# Patient Record
Sex: Female | Born: 1955
Health system: Southern US, Community
[De-identification: ages and names within clinical notes are randomized; demographics above are authoritative.]

## PROBLEM LIST (undated history)

## (undated) DIAGNOSIS — IMO0001 Reserved for inherently not codable concepts without codable children: Secondary | ICD-10-CM

## (undated) DIAGNOSIS — Z853 Personal history of malignant neoplasm of breast: Secondary | ICD-10-CM

## (undated) DIAGNOSIS — D0512 Intraductal carcinoma in situ of left breast: Secondary | ICD-10-CM

## (undated) DIAGNOSIS — M810 Age-related osteoporosis without current pathological fracture: Secondary | ICD-10-CM

## (undated) DIAGNOSIS — N950 Postmenopausal bleeding: Secondary | ICD-10-CM

## (undated) DIAGNOSIS — Z87442 Personal history of urinary calculi: Secondary | ICD-10-CM

## (undated) DIAGNOSIS — Z8709 Personal history of other diseases of the respiratory system: Secondary | ICD-10-CM

## (undated) DIAGNOSIS — M1990 Unspecified osteoarthritis, unspecified site: Secondary | ICD-10-CM

## (undated) DIAGNOSIS — I10 Essential (primary) hypertension: Secondary | ICD-10-CM

## (undated) DIAGNOSIS — Z803 Family history of malignant neoplasm of breast: Secondary | ICD-10-CM

## (undated) DIAGNOSIS — IMO0002 Reserved for concepts with insufficient information to code with codable children: Secondary | ICD-10-CM

## (undated) DIAGNOSIS — C50919 Malignant neoplasm of unspecified site of unspecified female breast: Secondary | ICD-10-CM

## (undated) HISTORY — DX: Family history of malignant neoplasm of breast: Z80.3

## (undated) HISTORY — PX: TUBAL LIGATION: SHX77

## (undated) HISTORY — PX: TONSILLECTOMY: SUR1361

## (undated) HISTORY — DX: Reserved for inherently not codable concepts without codable children: IMO0001

## (undated) HISTORY — DX: Reserved for concepts with insufficient information to code with codable children: IMO0002

## (undated) HISTORY — DX: Malignant neoplasm of unspecified site of unspecified female breast: C50.919

## (undated) HISTORY — DX: Personal history of malignant neoplasm of breast: Z85.3

## (undated) HISTORY — DX: Postmenopausal bleeding: N95.0

## (undated) HISTORY — PX: LASIK: SHX215

---

## 1993-07-15 HISTORY — PX: FOOT SURGERY: SHX648

## 2003-12-09 ENCOUNTER — Ambulatory Visit (HOSPITAL_COMMUNITY): Admission: RE | Admit: 2003-12-09 | Discharge: 2003-12-09 | Payer: Self-pay | Admitting: Family Medicine

## 2005-12-13 ENCOUNTER — Ambulatory Visit (HOSPITAL_COMMUNITY): Admission: RE | Admit: 2005-12-13 | Discharge: 2005-12-13 | Payer: Self-pay | Admitting: Family Medicine

## 2007-04-13 ENCOUNTER — Ambulatory Visit (HOSPITAL_COMMUNITY): Admission: RE | Admit: 2007-04-13 | Discharge: 2007-04-13 | Payer: Self-pay | Admitting: Family Medicine

## 2007-11-26 ENCOUNTER — Encounter: Payer: Self-pay | Admitting: Orthopedic Surgery

## 2008-04-27 ENCOUNTER — Ambulatory Visit (HOSPITAL_COMMUNITY): Admission: RE | Admit: 2008-04-27 | Discharge: 2008-04-27 | Payer: Self-pay | Admitting: Family Medicine

## 2008-04-27 ENCOUNTER — Encounter: Payer: Self-pay | Admitting: Orthopedic Surgery

## 2008-04-27 ENCOUNTER — Encounter (HOSPITAL_COMMUNITY): Admission: RE | Admit: 2008-04-27 | Discharge: 2008-05-27 | Payer: Self-pay | Admitting: Podiatry

## 2008-07-20 ENCOUNTER — Ambulatory Visit: Payer: Self-pay | Admitting: Orthopedic Surgery

## 2008-07-20 DIAGNOSIS — M722 Plantar fascial fibromatosis: Secondary | ICD-10-CM | POA: Insufficient documentation

## 2008-08-31 ENCOUNTER — Ambulatory Visit: Payer: Self-pay | Admitting: Orthopedic Surgery

## 2008-08-31 DIAGNOSIS — M7750 Other enthesopathy of unspecified foot: Secondary | ICD-10-CM | POA: Insufficient documentation

## 2008-09-01 ENCOUNTER — Telehealth: Payer: Self-pay | Admitting: Orthopedic Surgery

## 2008-09-05 ENCOUNTER — Ambulatory Visit (HOSPITAL_COMMUNITY): Admission: RE | Admit: 2008-09-05 | Discharge: 2008-09-05 | Payer: Self-pay | Admitting: Orthopedic Surgery

## 2008-09-06 ENCOUNTER — Encounter: Payer: Self-pay | Admitting: Orthopedic Surgery

## 2009-05-01 ENCOUNTER — Ambulatory Visit (HOSPITAL_COMMUNITY): Admission: RE | Admit: 2009-05-01 | Discharge: 2009-05-01 | Payer: Self-pay | Admitting: Family Medicine

## 2010-05-14 ENCOUNTER — Ambulatory Visit (HOSPITAL_COMMUNITY): Admission: RE | Admit: 2010-05-14 | Discharge: 2010-05-14 | Payer: Self-pay | Admitting: Family Medicine

## 2010-06-06 ENCOUNTER — Ambulatory Visit (HOSPITAL_COMMUNITY): Admission: RE | Admit: 2010-06-06 | Discharge: 2010-06-06 | Payer: Self-pay | Admitting: Family Medicine

## 2010-08-05 ENCOUNTER — Encounter: Payer: Self-pay | Admitting: Family Medicine

## 2011-04-17 ENCOUNTER — Other Ambulatory Visit: Payer: Self-pay | Admitting: Family Medicine

## 2011-04-17 DIAGNOSIS — Z139 Encounter for screening, unspecified: Secondary | ICD-10-CM

## 2011-05-17 ENCOUNTER — Ambulatory Visit (HOSPITAL_COMMUNITY)
Admission: RE | Admit: 2011-05-17 | Discharge: 2011-05-17 | Disposition: A | Discharge status: Home / Self Care | Payer: 59 | Source: Ambulatory Visit | Attending: Family Medicine | Admitting: Family Medicine

## 2011-05-17 DIAGNOSIS — Z1231 Encounter for screening mammogram for malignant neoplasm of breast: Secondary | ICD-10-CM | POA: Insufficient documentation

## 2011-05-17 DIAGNOSIS — Z139 Encounter for screening, unspecified: Secondary | ICD-10-CM

## 2011-05-29 ENCOUNTER — Emergency Department (HOSPITAL_COMMUNITY)
Admission: EM | Admit: 2011-05-29 | Discharge: 2011-05-29 | Disposition: A | Discharge status: Home / Self Care | Payer: 59 | Attending: Emergency Medicine | Admitting: Emergency Medicine

## 2011-05-29 ENCOUNTER — Encounter: Payer: Self-pay | Admitting: Emergency Medicine

## 2011-05-29 ENCOUNTER — Emergency Department (HOSPITAL_COMMUNITY): Payer: 59

## 2011-05-29 DIAGNOSIS — R209 Unspecified disturbances of skin sensation: Secondary | ICD-10-CM | POA: Insufficient documentation

## 2011-05-29 DIAGNOSIS — M542 Cervicalgia: Secondary | ICD-10-CM | POA: Insufficient documentation

## 2011-05-29 MED ORDER — DIAZEPAM 5 MG PO TABS
5.0000 mg | ORAL_TABLET | Freq: Once | ORAL | Status: AC
  Administered 2011-05-29: 5 mg via ORAL
  Filled 2011-05-29: qty 1

## 2011-05-29 MED ORDER — CYCLOBENZAPRINE HCL 10 MG PO TABS: 10.0000 mg | ORAL_TABLET | Freq: Two times a day (BID) | ORAL | Status: DC | PRN

## 2011-05-29 MED ORDER — HYDROMORPHONE HCL PF 1 MG/ML IJ SOLN
1.0000 mg | Freq: Once | INTRAMUSCULAR | Status: AC
  Administered 2011-05-29: 1 mg via INTRAMUSCULAR

## 2011-05-29 MED ORDER — HYDROCODONE-ACETAMINOPHEN 5-500 MG PO TABS
ORAL_TABLET | ORAL | Status: DC
Start: 2011-05-29 — End: 2011-05-30

## 2011-05-29 MED ORDER — HYDROMORPHONE HCL PF 1 MG/ML IJ SOLN
1.0000 mg | Freq: Once | INTRAMUSCULAR | Status: AC
  Administered 2011-05-29: 1 mg via INTRAMUSCULAR
  Filled 2011-05-29: qty 1

## 2011-05-29 MED ORDER — HYDROMORPHONE HCL PF 1 MG/ML IJ SOLN
INTRAMUSCULAR | Status: AC
  Filled 2011-05-29: qty 1

## 2011-05-29 MED ORDER — IBUPROFEN 800 MG PO TABS
800.0000 mg | ORAL_TABLET | Freq: Once | ORAL | Status: AC
  Administered 2011-05-29: 800 mg via ORAL
  Filled 2011-05-29: qty 1

## 2011-05-29 NOTE — ED Notes (Signed)
Patient c/o neck pain since Saturday.  Saw Dr. Gerda Diss at 5pm on 05/28/2011 and was given Lodine, states took one pill tonight and continues to c/o pain.

## 2011-05-29 NOTE — ED Provider Notes (Signed)
History     CSN: [id number] Arrival date & time: 05/29/2011  2:10 AM   First MD Initiated Contact with Patient 05/29/11 0217      Chief Complaint  Patient presents with  . Neck Pain    (Consider location/radiation/quality/duration/timing/severity/associated sxs/prior treatment) Patient is a 55 y.o. female presenting with neck injury.  Neck Injury This is a new problem. The current episode started yesterday. The problem occurs constantly. The problem has been gradually worsening. Pertinent negatives include no chest pain, no abdominal pain, no headaches and no shortness of breath. Exacerbated by: Movement and seems to be getting worse at nighttime. The symptoms are relieved by nothing. Treatments tried: Etodolac prescribed by her physician today and she took one pill with no relief.   Severe pain. Location is left side of posterior neck. Started a few weeks ago as mild pain left neck that attributed to a crick in her neck possibly from sleeping on it wrong.  She continued to work, where she does do a lot of lifting.  Last 2 days, the pain has been really bad at night. She saw her PCP and was prescribed lodine.  She took it with no relief.  Unable to sleep tonight due to severe pain.  No other trauma or injury. No h/o neck problems previously. She has not had any imaging of her C spine. No alleviating factiors.  Quality is Sharp in nature.   History reviewed. No pertinent past medical history.  History reviewed. No pertinent past surgical history.  No family history on file.  History  Substance Use Topics  . Smoking status: Never Smoker   . Smokeless tobacco: Not on file  . Alcohol Use: No    OB History    Grav Para Term Preterm Abortions TAB SAB Ect Mult Living                  Review of Systems  Constitutional: Negative for fever and chills.  HENT: Positive for neck pain. Negative for neck stiffness and tinnitus.   Eyes: Negative for pain.  Respiratory: Negative for  shortness of breath.   Cardiovascular: Negative for chest pain, palpitations and leg swelling.  Gastrointestinal: Negative for abdominal pain.  Genitourinary: Negative for dysuria.  Musculoskeletal: Negative for back pain.  Skin: Negative for rash.  Neurological: Negative for syncope, weakness, light-headedness and headaches.  All other systems reviewed and are negative.    Allergies  Sulfonamide derivatives  Home Medications  No current outpatient prescriptions on file.  BP 151/11  Pulse 105  Temp(Src) 98 F (36.7 C) (Oral)  Resp 22  Ht 5\' 2"  (1.575 m)  Wt 148 lb (67.132 kg)  BMI 27.07 kg/m2  SpO2 100%  Physical Exam  Constitutional: She is oriented to person, place, and time. She appears well-developed and well-nourished.  HENT:  Head: Normocephalic and atraumatic.  Eyes: Conjunctivae and EOM are normal. Pupils are equal, round, and reactive to light.  Neck: Full passive range of motion without pain. Neck supple. No tracheal deviation present.       Left mostly paracervical tenderness, no midline deformity. No mass, swelling or palpable spasm. UE strengths equal and intact, UE sensorium to light touch equal and intact with adequate radial pulses  Cardiovascular: Normal rate, regular rhythm, S1 normal, S2 normal and intact distal pulses.   Pulmonary/Chest: Effort normal and breath sounds normal.  Abdominal: Soft. Bowel sounds are normal. There is no tenderness. There is no CVA tenderness.  Musculoskeletal: Normal range of motion.  Lymphadenopathy:    She has no cervical adenopathy.  Neurological: She is alert and oriented to person, place, and time. She has normal strength and normal reflexes. No cranial nerve deficit or sensory deficit. She displays a negative Romberg sign. GCS eye subscore is 4. GCS verbal subscore is 5. GCS motor subscore is 6.       Normal Gait  Skin: Skin is warm and dry. No rash noted. No cyanosis. Nails show no clubbing.  Psychiatric: She has a  normal mood and affect. Her speech is normal and behavior is normal.    ED Course  Procedures (including critical care time)  Labs Reviewed - No data to display Ct Cervical Spine Wo Contrast  05/29/2011  *RADIOLOGY REPORT*  Clinical Data: Left-sided neck pain for 4 days.  CT CERVICAL SPINE WITHOUT CONTRAST  Technique:  Multidetector CT imaging of the cervical spine was performed. Multiplanar CT image reconstructions were also generated.  Comparison: None.  Findings: There is no evidence of acute fracture or subluxation. Minimal grade 1 anterolisthesis of C4 on C5 appears to reflect facet disease.  Small posterior disc osteophyte complexes are noted at the lower cervical spine.  Vertebral bodies demonstrate normal height.  Intervertebral disc spaces are preserved.  Prevertebral soft tissues are within normal limits.  The thyroid gland is unremarkable in appearance.  The visualized lung apices are clear.  No significant soft tissue abnormalities are seen.  The visualized portions of the brain are unremarkable in appearance.  IMPRESSION:  1.  No evidence of acute fracture or subluxation along the cervical spine. 2.  Mild degenerative change along the lower cervical spine, with minimal grade 1 anterolisthesis of C4 on C5.  Original Report Authenticated By: Tonia Ghent, M.D.    Aggressive pain management and imaging as above to evaluate. On recheck pain improved after medications and no change on normal neuro exam.   MDM   Severe neck pain with left arm numbness and no associated trauma. DIlaudid Valium and Motrin significantly improved her symptoms. CT scan obtained and reviewed as above and results shared with patient. Plan orthopedic referral and primary care followup as needed. Patient aware she may require MRI if she has persistent symptoms. Plan continue etodolac at home. Will prescribe Flexeril and hydrocodone at nighttime as needed. Work note provided for no heavy lifting until symptoms improve  or until released by her physician.         Sunnie Nielsen, MD 05/29/11 [phone]

## 2011-05-30 ENCOUNTER — Encounter: Payer: Self-pay | Admitting: Orthopedic Surgery

## 2011-05-30 ENCOUNTER — Ambulatory Visit (INDEPENDENT_AMBULATORY_CARE_PROVIDER_SITE_OTHER): Payer: 59 | Admitting: Orthopedic Surgery

## 2011-05-30 VITALS — BP 124/80 | Ht 62.0 in | Wt 148.0 lb

## 2011-05-30 DIAGNOSIS — M5020 Other cervical disc displacement, unspecified cervical region: Secondary | ICD-10-CM

## 2011-05-30 MED ORDER — HYDROCODONE-ACETAMINOPHEN 5-500 MG PO TABS: ORAL_TABLET | ORAL | Status: DC

## 2011-05-30 MED ORDER — CYCLOBENZAPRINE HCL 10 MG PO TABS: 10.0000 mg | ORAL_TABLET | Freq: Two times a day (BID) | ORAL | Status: DC | PRN

## 2011-05-30 NOTE — Progress Notes (Signed)
Chief complaint followup from the emergency room department visit on November 14 for severe pain in the cervical spine radiating to the LEFT arm.  The patient reports actual one-month history of a stiff neck which progressed to unbearable pain requiring emergency room and department visit on the 14th she was treated with hydrocodone and Flexeril orally after failing oral loading from her primary care physician on the 12th in the emergency room she received injection of pain medication and a muscle relaxer[ DIlaudid Valium and Motrin significantly improved her symptoms]and presents now with less symptoms but still complaining of dull aching pain in her cervical spine associated with some numbness and tingling  Vital signs are stable.  Her appearance is normal she has a normal body habitus she is oriented x3 with normal mood  Nonambulatory abnormalities  The cervical spine is nontender but there is tenderness in the LEFT trapezius muscle were normal range of motion in the shoulder decreased range of motion of the cervical spine.  Shoulder elbow and wrist are stable strength in the LEFT upper extremity is normal the skin is intact there is no rash.  Pulse and temperature are normal without edema LEFT nose are negative sensation remains normal reflexes are equal at 2+ at the elbow and wrist.  No pathologic reflexes. CT Scan obtained and shows:  IMPRESSION:  1. No evidence of acute fracture or subluxation along the cervical  spine.  2. Mild degenerative change along the lower cervical spine, with  minimal grade 1 anterolisthesis of C4 on C5.  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  Plan continue etodolac at home. Will prescribe Flexeril and hydrocodone at nighttime as needed. Work note provided for no heavy lifting until symptoms improve or until released by her physician.

## 2011-05-30 NOTE — Patient Instructions (Signed)
Continue medications   RTW on   Start PT

## 2011-06-03 ENCOUNTER — Encounter: Payer: Self-pay | Admitting: Orthopedic Surgery

## 2011-06-03 DIAGNOSIS — M5020 Other cervical disc displacement, unspecified cervical region: Secondary | ICD-10-CM | POA: Insufficient documentation

## 2011-06-03 MED ORDER — CYCLOBENZAPRINE HCL 10 MG PO TABS: 10.0000 mg | ORAL_TABLET | Freq: Two times a day (BID) | ORAL | Status: AC | PRN

## 2011-06-12 ENCOUNTER — Ambulatory Visit (HOSPITAL_COMMUNITY)
Admission: RE | Admit: 2011-06-12 | Discharge: 2011-06-12 | Disposition: A | Discharge status: Home / Self Care | Payer: 59 | Source: Ambulatory Visit | Attending: Orthopedic Surgery | Admitting: Orthopedic Surgery

## 2011-06-12 DIAGNOSIS — IMO0001 Reserved for inherently not codable concepts without codable children: Secondary | ICD-10-CM | POA: Insufficient documentation

## 2011-06-12 DIAGNOSIS — M6281 Muscle weakness (generalized): Secondary | ICD-10-CM | POA: Insufficient documentation

## 2011-06-12 DIAGNOSIS — M542 Cervicalgia: Secondary | ICD-10-CM | POA: Insufficient documentation

## 2011-06-12 DIAGNOSIS — M25519 Pain in unspecified shoulder: Secondary | ICD-10-CM | POA: Insufficient documentation

## 2011-06-12 NOTE — Progress Notes (Signed)
Physical Therapy Evaluation  Patient Details  Name: Janice Higgins MRN: [id number] Date of Birth: 1955/08/13  Today's Date: 06/12/2011 Time: 4782-9562 Time Calculation (min): 49 min Charges: 1 eval Visit#: 1  of 8   Re-eval: 07/12/11 Assessment Diagnosis: Cervical Pain Next MD Visit: unscheduled Prior Therapy: None  Past Medical History:  Past Medical History  Diagnosis Date  . Arthritis   . Kidney stones    Past Surgical History:  Past Surgical History  Procedure Date  . Foot surgery     Subjective Symptoms/Limitations Symptoms: Pt reports that for about a month she has been dealing with neck pain w/LUE neuropathy that started around 05/28/11.  She reoprts that the night before she was holding her 25lb grandson and felt her L arm getting weak.  She sought advice from Dr. Gerda Diss who prescribed some medication.  She then had to go the ER on 11/14 because her pain was so bad.  They gave her oxycodone and to go to the orthopedic.  She was then referred to PT for her neck pain.  She reports most of her pain is at night time and needs to take the oxycodone.  Pain: 2-8/10.  Aggravating Factors: Pushing and lifting materials; Alleviating Factors: medication.  NaturePayton Higgins to her neck and sharp and shooting and aching pain into her L arm and into fingers. Denies dizziness, nausea or previous history of neck pain.  How long can you sit comfortably?: no pain How long can you walk comfortably?: Waking 3-4x a night without medicaition.  Pain Assessment Currently in Pain?: Yes Pain Score:   2 Pain Location: Neck Pain Orientation: Left Pain Type: Acute pain   06/12/11 1600  Assessment  Diagnosis Cervical Pain  Next MD Visit unscheduled  Prior Therapy None  Home Living  Lives With Spouse  Prior Function  Vocation Full time employment  Vocation Requirements Works in Proofreader   Leisure Hobbies-yes (Comment)  Comments Takes care of her grandchildren.  cooking, shopping  Functional  Tests  Functional Tests Palpation: Increased pain and spasm to her L and R UT, L middle scalene and levator scapula  RUE Strength  Grip (lbs) 44  LUE AROM (degrees)  LUE Overall AROM Comments WNL overall  LUE Strength  Left Shoulder Flexion 3+/5  Left Shoulder Extension 5/5  Left Shoulder ABduction 3+/5  Left Shoulder Internal Rotation 4/5  Left Shoulder External Rotation 4/5  Grip (lbs) 24  Cervical AROM  Cervical Flexion Decreased 10% w/increaed pain  Cervical Extension WNL   Cervical - Right Side Bend Decreased 50%  increased pain  Cervical - Left Side Bend Decreased 50% w increased pain  Cervical - Right Rotation WNL  Cervical - Left Rotation decreased 15%  Cervical Strength  Cervical Flexion 4/5  Cervical Extension 4/5  Cervical - Right Side Bend 4/5  Cervical - Left Side Bend 4/5  Cervical - Right Rotation 3+/5 (increased muscular pain)  Cervical - Left Rotation 4/5     Exercise/Treatments Stretches Upper Trapezius Stretch: 30 seconds;Limitations Upper Trapezius Stretch Limitations: bilateral Neck Exercises Neck Retraction: 5 reps;Supine Neck Rotation - Right: 5 reps;Limitations Neck Rotation - Right Limitations: w/shoulder motion Neck Rotation - Left: 5 reps;Limitations Neck Rotation - Left Limitations: w/shoulder motion    Physical Therapy Assessment and Plan PT Assessment and Plan Clinical Impression Statement: Pt is a 55 year old female referred to PT secondary to acute onset of neck pain.  After examiniation it was found that she has increased cervical pain, significant cervical  muscle spasms, decreased cervical ROM, decreased shoulder, cervical and hand strength which are limiting her ability to participate in community and work related activities.  Pt will benefit from skilled OPPT in order to address above impairments in order to maximize function.  Rehab Potential: Good PT Frequency: Min 2X/week PT Duration: 4 weeks PT Treatment/Interventions: Functional  mobility training;Therapeutic exercise;Patient/family education;Other (comment) (Manual and modalities for pain control) PT Plan: E-stim to cervical region, UBE, theraband, encourage full cervical ROM.     Goals Home Exercise Program Pt will Perform Home Exercise Program: Independently PT Short Term Goals Time to Complete Short Term Goals: 4 weeks PT Short Term Goal 1: Pt will report pain less than 4/10 for 75% of her day for improved quality of life.  PT Short Term Goal 2: Pt will improve her cervical AROM to [facility] in order to be able to turn her neck while driving.  PT Short Term Goal 3: Pt will improve L UE and cervical strength by 1 muscle grade and by 5lbs on the hand held dynamomter in order to open jars comfortably.  PT Short Term Goal 4: Pt will present with minimal spasm to her cervical region for improved quality of life.   Problem List Patient Active Problem List  Diagnoses  . OTHER ENTHESOPATHY OF ANKLE AND TARSUS  . PLANTAR FACIITIS  . HNP (herniated nucleus pulposus), cervical    PT - End of Session Activity Tolerance: Patient tolerated treatment well   Janice Higgins 06/12/2011, 5:53 PM  Physician Documentation Your signature is required to indicate approval of the treatment plan as stated above.  Please sign and either send electronically or make a copy of this report for your files and return this physician signed original.   Please mark one 1.__approve of plan  2. ___approve of plan with the following conditions.   ______________________________                                                          _____________________ Physician Signature                                                                                                             Date

## 2011-06-18 ENCOUNTER — Ambulatory Visit (HOSPITAL_COMMUNITY)
Admission: RE | Admit: 2011-06-18 | Discharge: 2011-06-18 | Disposition: A | Discharge status: Home / Self Care | Payer: 59 | Source: Ambulatory Visit | Attending: Orthopedic Surgery | Admitting: Orthopedic Surgery

## 2011-06-18 DIAGNOSIS — IMO0001 Reserved for inherently not codable concepts without codable children: Secondary | ICD-10-CM | POA: Insufficient documentation

## 2011-06-18 DIAGNOSIS — M6281 Muscle weakness (generalized): Secondary | ICD-10-CM | POA: Insufficient documentation

## 2011-06-18 DIAGNOSIS — M25519 Pain in unspecified shoulder: Secondary | ICD-10-CM | POA: Insufficient documentation

## 2011-06-18 DIAGNOSIS — M542 Cervicalgia: Secondary | ICD-10-CM | POA: Insufficient documentation

## 2011-06-18 NOTE — Progress Notes (Addendum)
Physical Therapy Treatment Patient Details  Name: FLORINE SPRENKLE MRN: [id number] Date of Birth: 12-Jun-1956  Today's Date: 06/18/2011 Time: 5284-1324 Time Calculation (min): 51 min Visit#: 2  of 8   Re-eval: 07/12/11  Charge: therex 35 min Premod estim 12 min  Subjective: Symptoms/Limitations Symptoms: Pt reports no longer pain in neck, only in her L UE down to hand.  Pain scale 3/10.  Pt questioned the positioning of her key board at work Pain Assessment Currently in Pain?: Yes Pain Score:   3 Pain Location: Arm Pain Orientation: Left  Objective:   Exercise/Treatments Stretches Upper Trapezius Stretch: 3 reps;30 seconds;Limitations Upper Trapezius Stretch Limitations: bilateral Neck Exercises Neck Retraction: 10 reps;Supine;Limitations Neck Retraction Limitations: 5" holds Neck Lateral Flexion - Right: 5 reps;Seated;Limitations Neck Lateral Flexion - Right Limitations: 5" holds Neck Lateral Flexion - Left: 5 reps;Limitations Neck Lateral Flexion - Left Limitations: 5" holds Neck Rotation - Right: 5 reps;Limitations Neck Rotation - Right Limitations: 5" holds Neck Rotation - Left: 5 reps;Limitations Neck Rotation - Left Limitations: 5" holds Shoulder Extension: 10 reps;Standing;Theraband Theraband Level (Shoulder Extension): Level 3 (Green) Row: 10 reps;Standing;Theraband Theraband Level (Row): Level 3 (Green) Scapular Retraction: 10 reps;Standing;Theraband Theraband Level (Scapular Retraction): Level 3 (Green) W Back: 10 reps X to V: 10 reps Additional Neck Exercises UBE (Upper Arm Bike): 4' @ 2.0  Premod estim 12 min  Physical Therapy Assessment and Plan PT Assessment and Plan Clinical Impression Statement: Began new therex activities per PT POC, pt able to following all demonstrations with min cueing for posture and not to compensate.  Pt instructed/given pink putty to improve L hand strength.  Pt stated relief in L hand following premod estim. PT Plan: Continue  progressing strength, ROM and reduce pain.    Goals Home Exercise Program Pt will Perform Home Exercise Program: Independently PT Short Term Goals Time to Complete Short Term Goals: 4 weeks PT Short Term Goal 1: Pt will report pain less than 4/10 for 75% of her day for improved quality of life.  PT Short Term Goal 2: Pt will improve her cervical AROM to [facility] in order to be able to turn her neck while driving.  PT Short Term Goal 3: Pt will improve L UE and cervical strength by 1 muscle grade and by 5lbs on the hand held dynamomter in order to open jars comfortably.  PT Short Term Goal 4: Pt will present with minimal spasm to her cervical region for improved quality of life.   Problem List Patient Active Problem List  Diagnoses  . OTHER ENTHESOPATHY OF ANKLE AND TARSUS  . PLANTAR FACIITIS  . HNP (herniated nucleus pulposus), cervical    PT - End of Session Activity Tolerance: Patient tolerated treatment well General Behavior During Session: [facility] for tasks performed Cognition: [facility] for tasks performed  [name] 06/18/2011, 6:14 PM

## 2011-06-27 ENCOUNTER — Ambulatory Visit (HOSPITAL_COMMUNITY)
Admission: RE | Admit: 2011-06-27 | Discharge: 2011-06-27 | Disposition: A | Discharge status: Home / Self Care | Payer: 59 | Source: Ambulatory Visit | Attending: Family Medicine | Admitting: Family Medicine

## 2011-06-27 NOTE — Progress Notes (Signed)
Physical Therapy Treatment Patient Details  Name: Janice Higgins MRN: [id number] Date of Birth: [birth year]  Today's Date: 06/27/2011 Time: 5621-3086 Time Calculation (min): 49 min Visit#: 3  of 8   Re-eval: 07/12/11  Charge: therex 22 min Manual 8 min estim 15 min  Subjective: Symptoms/Limitations Symptoms: I've been hurting this week, on Saturday I was feeling so good I didn't take any pain meds and have been suffering ever since.  L cervical and L UE pain scale 3/10. Pain Assessment Currently in Pain?: Yes Pain Score:   3 Pain Location: Neck Pain Orientation: Left Multiple Pain Sites: Yes  Objective:   Exercise/Treatments Stretches Upper Trapezius Stretch: 3 reps;30 seconds;Limitations Upper Trapezius Stretch Limitations: bilateral Neck Exercises Neck Lateral Flexion - Right: 5 reps;Seated;Limitations Neck Lateral Flexion - Right Limitations: 5" holds Neck Lateral Flexion - Left: 5 reps;Limitations Neck Lateral Flexion - Left Limitations: 5" holds Neck Rotation - Right: 5 reps;Limitations Neck Rotation - Right Limitations: 5" holds Neck Rotation - Left: 5 reps;Limitations Neck Rotation - Left Limitations: 5" holds Shoulder Extension: 15 reps;Theraband Theraband Level (Shoulder Extension): Level 3 (Green) Row: 15 reps;Theraband Theraband Level (Row): Level 3 (Green) Scapular Retraction: 15 reps;Theraband Theraband Level (Scapular Retraction): Level 3 (Green) W Back: 15 reps X to V: 15 reps Additional Neck Exercises UBE (Upper Arm Bike): 4' @ 2.0  Modalities Modalities: Electrical Stimulation Manual Therapy Manual Therapy: Other (comment) Other Manual Therapy: STM to L cervical/ L Upper traps to resolve spasms x 8 min Electrical Stimulation Electrical Stimulation Location: L Cervical/ UT Electrical Stimulation Action: to reduce pain Electrical Stimulation Parameters: premod V 7.4-->8.0 V Electrical Stimulation Goals: Pain  Physical Therapy Assessment and  Plan PT Assessment and Plan Clinical Impression Statement: Pt able to demonstrate all therex with manual cueing for proper form with scapular retraction and stretches.  Palpalable spasms/trigger points L cervical /UT. PT Plan: Continue progressing strength, ROM and reduce pain    Goals    Problem List Patient Active Problem List  Diagnoses  . OTHER ENTHESOPATHY OF ANKLE AND TARSUS  . PLANTAR FACIITIS  . HNP (herniated nucleus pulposus), cervical    PT - End of Session Activity Tolerance: Patient tolerated treatment well General Behavior During Session: [facility] for tasks performed Cognition: [facility] for tasks performed  [name] 06/27/2011, 5:08 PM

## 2011-06-28 ENCOUNTER — Ambulatory Visit (HOSPITAL_COMMUNITY)
Admission: RE | Admit: 2011-06-28 | Discharge: 2011-06-28 | Disposition: A | Discharge status: Home / Self Care | Payer: 59 | Source: Ambulatory Visit | Attending: Physical Therapy | Admitting: Physical Therapy

## 2011-06-28 NOTE — Progress Notes (Signed)
Physical Therapy Treatment Patient Details  Name: Janice Higgins MRN: [id number] Date of Birth: [birth year] Today's Date: 06/28/2011 Time: 1478-2956 Time Calculation (min): 39 min Visit#: 4  of 8   Re-eval: 07/12/11  Charge: manual 15 min therex 20 min  Subjective: Symptoms/Limitations Symptoms: My L hand has been killing me today, lifted up my grandkid last night and did laundry at work today; pain not constant pain just hurts when I turn it a certain way or lift something it really hurts, pain scale 6/10. Pain Assessment Pain Score:   6 Pain Location: Hand Pain Orientation: Left  Objective:   Exercise/Treatments Stretches Upper Trapezius Stretch: 3 reps;30 seconds;Limitations Neck Exercises Neck Retraction: 10 reps;Supine Neck Lateral Flexion - Right: 5 reps;Seated;Limitations Neck Lateral Flexion - Right Limitations: cervical ROM 5" holds, Isometric 5 reps 5" holds Neck Lateral Flexion - Left: 5 reps;Limitations Neck Lateral Flexion - Left Limitations: cervical ROM 5" holds, Isometric 5 reps 5" holds Neck Rotation - Right: 5 reps;Limitations Neck Rotation - Right Limitations: cervical ROM 5" holds, Isometric 5 reps 5" holds Neck Rotation - Left: 5 reps;Limitations Neck Rotation - Left Limitations: cervical ROM 5" holds, Isometric 5 reps 5" holds W Back: 15 reps X to V: 15 reps Additional Neck Exercises UBE (Upper Arm Bike): 4' @ 2.0  Manual Therapy Other Manual Therapy: Manual cervical traction x 8 min; SCS to upper traps/ STM to L cervical/ L Upper traps to resolve spasms x 8 min   Physical Therapy Assessment and Plan PT Assessment and Plan Clinical Impression Statement: Switched from estim to manual traction trial for pain relief.  Added isometric cervical  therex for strengthening with min cueing for proper tech.   PT Plan: Assess pain relief with traction, begin mechanical traction if pain relieved following manual traction, retuse estim if no relief.    Goals     Problem List Patient Active Problem List  Diagnoses  . OTHER ENTHESOPATHY OF ANKLE AND TARSUS  . PLANTAR FACIITIS  . HNP (herniated nucleus pulposus), cervical    PT - End of Session Activity Tolerance: Patient tolerated treatment well General Behavior During Session: [facility] for tasks performed Cognition: [facility] for tasks performed  Janice Higgins 06/28/2011, 5:48 PM

## 2011-07-02 ENCOUNTER — Ambulatory Visit (HOSPITAL_COMMUNITY)
Admission: RE | Admit: 2011-07-02 | Discharge: 2011-07-02 | Disposition: A | Discharge status: Home / Self Care | Payer: 59 | Source: Ambulatory Visit | Attending: Family Medicine | Admitting: Family Medicine

## 2011-07-02 NOTE — Progress Notes (Signed)
Physical Therapy Treatment Patient Details  Name: TAURIEL SCRONCE MRN: [id number] Date of Birth: Jul 27, 1955  Today's Date: 07/02/2011 Time: 4098-1191 Time Calculation (min): 26 min Visit#: 5  of 9   Re-eval: 07/12/11  massage 22 min there ex 6  Subjective: Symptoms/Limitations Symptoms: I tried to go without my pain meds Sunday and it was a mistake.  The traction made me hurt worse yesterday.  My neck really isn't that bad it's a 2/10 but my wrist and arm can be a 10 at times.  I almost cancelled today's treatment. Pain Assessment Currently in Pain?: Yes Pain Score:   2 Pain Location: Neck   Exercise/Treatments   Neck Exercises Shoulder shrugs:  (shoulders up/down now relax. x5) Scapular Retraction: 10 reps Additional Neck Exercises   wrist extension stretch 3x 30sec.  Manual Therapy Manual Therapy: Other (comment) Other Manual Therapy: massage to cervical and mid trap area.  Marked mm spasm mid trap decreased but not resoloved with massage.  Physical Therapy Assessment and Plan PT Assessment and Plan Clinical Impression Statement: Attempeted soft tissue work to decrease pain as well as wrist stretching.  Assess how pt does with this treatment.  Pt I with prior ex needs more advanced. PT Plan: Assess how soft tissue and stretch does.    Goals    Problem List Patient Active Problem List  Diagnoses  . OTHER ENTHESOPATHY OF ANKLE AND TARSUS  . PLANTAR FACIITIS  . HNP (herniated nucleus pulposus), cervical     RUSSELL,CINDY 07/02/2011, 4:48 PM

## 2011-07-02 NOTE — Patient Instructions (Signed)
HEP

## 2011-07-04 ENCOUNTER — Ambulatory Visit (HOSPITAL_COMMUNITY)
Admission: RE | Admit: 2011-07-04 | Discharge: 2011-07-04 | Disposition: A | Discharge status: Home / Self Care | Payer: 59 | Source: Ambulatory Visit | Attending: Family Medicine | Admitting: Family Medicine

## 2011-07-04 NOTE — Progress Notes (Signed)
Physical Therapy Treatment Patient Details  Name: Janice Higgins MRN: [id number] Date of Birth: [birth year]  Today's Date: 07/04/2011 Time: 4098-1191 Time Calculation (min): 38 min Visit#: 6  of 9   Re-eval: 07/12/11  Charge: therex 26 min Manual 12 min  Subjective: Symptoms/Limitations Symptoms: My neck is stiff, neck and L arm/hand doesn't bother me unless I move a certain way.  My necjk has been really sore since that massage last session but it felt better following.  Neck pain scale 2/10, hand 8/10 when move certain way. Pain Assessment Currently in Pain?: Yes Pain Score:   2 Pain Location: Neck Pain Orientation: Left  Objective:  Manual: massage to cervical, upper and mid trap area. Able to resolve all except mid trap spasm  Exercise/Treatments Stretches Upper Trapezius Stretch: 3 reps;30 seconds;Limitations Upper Trapezius Stretch Limitations: bilateral Shoulder Rolls: 10 reps Neck Exercises Neck Retraction: 10 reps;Other (comment) Neck Retraction Limitations: 5" holds, prone Additional Neck Exercises UBE (Upper Arm Bike): 4' @ 2.0  Physical Therapy Assessment and Plan PT Assessment and Plan Clinical Impression Statement: Advanced cervical retraction to prone, chin tuck head lift.  Pt able to perform with cueing for proper tech.  Pt required cueing for proper form wtih upper trap st, pt given HEP printout and able to st correctly.  Ended session with STM, able to resolve 90% mid and upper traps with pain relief stated. Rehab Potential: Poor PT Plan: Assess pain relief following STM.    Goals    PT - End of Session Activity Tolerance: Patient tolerated treatment well General Behavior During Session: [facility] for tasks performed Cognition: [facility] for tasks performed  [name] 07/04/2011, 5:33 PM

## 2011-07-11 ENCOUNTER — Ambulatory Visit (HOSPITAL_COMMUNITY)
Admission: RE | Admit: 2011-07-11 | Discharge: 2011-07-11 | Disposition: A | Discharge status: Home / Self Care | Payer: 59 | Source: Ambulatory Visit

## 2011-07-11 NOTE — Progress Notes (Signed)
Physical Therapy Treatment Patient Details  Name: Janice Higgins MRN: [id number] Date of Birth: [birth year]  Today's Date: 07/11/2011 Time: 4098-1191 Time Calculation (min): 45 min Visit#: 7  of 9   Re-eval: 07/12/11  Charge: therex: 30 min Manual 10 min  Subjective: Symptoms/Limitations Symptoms: No pain in my neck or shoulders, when I move my L arm a certain movement it can get up to a 10/10.  I was holding my grandbaby last night and he kicked me (has a bruise on L wrist) Pain Assessment Currently in Pain?: No/denies  Objective:   Exercise/Treatments Stretches Upper Trapezius Stretch: 3 reps;30 seconds;Limitations Upper Trapezius Stretch Limitations: bilateral Shoulder Rolls: 10 reps Neck Exercises Neck Retraction: 15 reps;Seated;Limitations;Other (comment) Neck Retraction Limitations: 5" holds; Prone chin/tuck head lifts 10x 5" Shoulder Extension: 15 reps;Theraband Theraband Level (Shoulder Extension): Level 4 (Blue) Row: 15 reps;Theraband;Prone;10 reps Theraband Level (Row): Level 4 (Blue) Scapular Retraction: 15 reps;Standing;Theraband Theraband Level (Scapular Retraction): Level 4 (Blue) W Back: Prone;10 reps Additional Neck Exercises UBE (Upper Arm Bike): 4' @ 3.0 backward Radial Neural Tension: 2 reps;30 seconds     Physical Therapy Assessment and Plan PT Assessment and Plan Clinical Impression Statement: Pt able to perform all tband movments correctly with good form/posture.  Pt given blue tband with worksheet to add to her HEP. STM complete at end of session to reduce spasm of mid traps.  Added radial neural tension, to ask to complete one more tonight and report relief at next session. PT Plan: Re-assess next session.    Goals    Problem List Patient Active Problem List  Diagnoses  . OTHER ENTHESOPATHY OF ANKLE AND TARSUS  . PLANTAR FACIITIS  . HNP (herniated nucleus pulposus), cervical    PT - End of Session Activity Tolerance: Patient tolerated  treatment well General Behavior During Session: [facility] for tasks performed Cognition: [facility] for tasks performed  [name] 07/11/2011, 6:09 PM

## 2011-07-12 ENCOUNTER — Ambulatory Visit (HOSPITAL_COMMUNITY)
Admission: RE | Admit: 2011-07-12 | Discharge: 2011-07-12 | Disposition: A | Discharge status: Home / Self Care | Payer: 59 | Source: Ambulatory Visit | Attending: Physical Therapy | Admitting: Physical Therapy

## 2011-07-12 NOTE — Progress Notes (Signed)
Physical Therapy Evaluation  Patient Details  Name: Janice Higgins MRN: [id number] Date of Birth: 10/11/1955  Today's Date: 07/12/2011 Time: 1600-1701 Time Calculation (min): 61 min Visit#: 8  of 9   Re-eval: 07/12/11    Past Medical History:  Past Medical History  Diagnosis Date  . Arthritis   . Kidney stones    Past Surgical History:  Past Surgical History  Procedure Date  . Foot surgery     Subjective Symptoms/Limitations Symptoms: Pt states that her neck pain is gone but she is having quite a bit of pain on the lateral aspect of her wrist.  Lateral aspect of wrist is swollen with tenderness to palpation.  Pt has increased pain with resistive wrist flexion, wrist extension and thumb horizontal abduction.   The patient states if she does not take pain medication than she has increased pain.   Pain Assessment Currently in Pain?: No/denies Pain Location: Neck Pain Orientation: Left Multiple Pain Sites: Yes  Assessment:  (eval) LUE AROM (degrees) LUE Overall AROM Comments:  (wnl) LUE Strength Left Shoulder Flexion: 4/5 Left Shoulder Extension: 5/5 Left Shoulder ABduction: 5/5 Left Shoulder Internal Rotation: 5/5 Left Shoulder External Rotation: 5/5 Grip (lbs): 35 (R=45) Cervical AROM Cervical Flexion: wnl Cervical Extension: wnl Cervical - Right Side Bend: decreased 20% (decreased 50%) Cervical - Left Side Bend: decreased 20%  (decreased 50%) Cervical - Right Rotation: wnl Cervical - Left Rotation: decreased 5% (decrease 5 %) Cervical Strength Cervical Flexion: 4/5 (4/5) Cervical Extension: 4/5 (4/5) Cervical - Right Side Bend: 4/5 (4/5) Cervical - Left Side Bend: 4/5 (4/5) Cervical - Right Rotation: 4/5 (4/5) Cervical - Left Rotation: 4/5 (3+/5)  Exercise/Treatments Stretches  Median and radial n stretches given    Neck Exercises- for ROM   Massage to L mid trap area with min-mod mm spasm noted.    Physical Therapy Assessment and Plan   Discharge  therapy for neck.  Pt to continue with her HEP    Pt sx of cervical has decreased significantly.  She appears to have a new problem with her wrist.  The patient was given a HEP and told to ice three times a day.  If symptoms do not improve in two weeks pt is to make an appointment with Dr. Romeo Apple.  Goals Home Exercise Program Pt will Perform Home Exercise Program: Independently PT Short Term Goals PT Short Term Goal 1 - Progress: Met (for neck but not for wrist.) PT Short Term Goal 2 - Progress: Met PT Short Term Goal 3 - Progress: Met PT Short Term Goal 4 - Progress: Met  Problem List Patient Active Problem List  Diagnoses  . OTHER ENTHESOPATHY OF ANKLE AND TARSUS  . PLANTAR FACIITIS  . HNP (herniated nucleus pulposus), cervical        Adalyn Pennock,CINDY 07/12/2011, 5:01 PM  Physician Documentation Your signature is required to indicate approval of the treatment plan as stated above.  Please sign and either send electronically or make a copy of this report for your files and return this physician signed original.   Please mark one 1.__approve of plan  2. ___approve of plan with the following conditions.   ______________________________  _____________________ Physician Signature                                                                                                             Date

## 2011-07-22 ENCOUNTER — Other Ambulatory Visit: Payer: Self-pay | Admitting: Orthopedic Surgery

## 2011-09-05 ENCOUNTER — Ambulatory Visit (INDEPENDENT_AMBULATORY_CARE_PROVIDER_SITE_OTHER): Payer: 59 | Admitting: Otolaryngology

## 2011-09-05 DIAGNOSIS — R040 Epistaxis: Secondary | ICD-10-CM

## 2012-05-19 ENCOUNTER — Other Ambulatory Visit: Payer: Self-pay | Admitting: Family Medicine

## 2012-05-19 DIAGNOSIS — Z139 Encounter for screening, unspecified: Secondary | ICD-10-CM

## 2012-05-25 ENCOUNTER — Ambulatory Visit (HOSPITAL_COMMUNITY)
Admission: RE | Admit: 2012-05-25 | Discharge: 2012-05-25 | Disposition: A | Discharge status: Home / Self Care | Payer: 59 | Source: Ambulatory Visit | Attending: Family Medicine | Admitting: Family Medicine

## 2012-05-25 DIAGNOSIS — Z139 Encounter for screening, unspecified: Secondary | ICD-10-CM

## 2012-05-25 DIAGNOSIS — Z1231 Encounter for screening mammogram for malignant neoplasm of breast: Secondary | ICD-10-CM | POA: Insufficient documentation

## 2012-10-02 ENCOUNTER — Encounter: Payer: Self-pay | Admitting: *Deleted

## 2012-10-05 ENCOUNTER — Ambulatory Visit (HOSPITAL_COMMUNITY)
Admission: RE | Admit: 2012-10-05 | Discharge: 2012-10-05 | Disposition: A | Discharge status: Home / Self Care | Payer: 59 | Source: Ambulatory Visit | Attending: Family Medicine | Admitting: Family Medicine

## 2012-10-05 ENCOUNTER — Encounter: Payer: Self-pay | Admitting: Family Medicine

## 2012-10-05 ENCOUNTER — Other Ambulatory Visit (HOSPITAL_COMMUNITY): Payer: 59

## 2012-10-05 ENCOUNTER — Other Ambulatory Visit: Payer: Self-pay

## 2012-10-05 ENCOUNTER — Ambulatory Visit (INDEPENDENT_AMBULATORY_CARE_PROVIDER_SITE_OTHER): Payer: 59 | Admitting: Family Medicine

## 2012-10-05 VITALS — BP 126/88 | HR 80 | Wt 168.4 lb

## 2012-10-05 DIAGNOSIS — M7989 Other specified soft tissue disorders: Secondary | ICD-10-CM

## 2012-10-05 DIAGNOSIS — M129 Arthropathy, unspecified: Secondary | ICD-10-CM

## 2012-10-05 DIAGNOSIS — M1990 Unspecified osteoarthritis, unspecified site: Secondary | ICD-10-CM

## 2012-10-05 DIAGNOSIS — M25579 Pain in unspecified ankle and joints of unspecified foot: Secondary | ICD-10-CM | POA: Insufficient documentation

## 2012-10-05 MED ORDER — DICLOFENAC SODIUM 75 MG PO TBEC: 75.0000 mg | DELAYED_RELEASE_TABLET | Freq: Two times a day (BID) | ORAL | Status: DC

## 2012-10-05 NOTE — Progress Notes (Signed)
  Subjective:    Patient ID: Janice Higgins, female    DOB: 1956-03-18, 57 y.o.   MRN: [id number]  HPI patient arrives office with left ankle pain. Several days duration. Did not have an acute injury. Primarily left lateral ankle. Positive history of arthritis. Pain is sharp intermittent comes and goes. Increasing swelling last few days. On further history generally does not have much swelling. Notes that she is been doing overtime another peoples work lately    Review of Systems  All other systems reviewed and are negative.       Objective:   Physical Exam Physical exam. Alert no acute distress. Vitals review. HEENT normal. Lungs clear. Heart regular rate and rhythm. Left lateral ankle tender over the malleolus some soft tissue swelling some pain with flexion no erythema no warmth       Assessment & Plan:  Impression probable flare of arthritis with element of tendinitis. X-ray unremarkable except tissue swelling. Plan stop loading. Start Voltaren 75 twice a day. Local measures discussed.

## 2012-10-12 ENCOUNTER — Other Ambulatory Visit: Payer: Self-pay | Admitting: *Deleted

## 2012-10-12 ENCOUNTER — Telehealth: Payer: Self-pay | Admitting: Family Medicine

## 2012-10-12 MED ORDER — HYDROCODONE-ACETAMINOPHEN 5-325 MG PO TABS: ORAL_TABLET | ORAL | Status: DC

## 2012-10-12 NOTE — Telephone Encounter (Signed)
Discuss with pt. rx called in to pharm. Med list updated.

## 2012-10-12 NOTE — Telephone Encounter (Signed)
We can call in hydrocod, acet 5/325 to add to antiinflam. Numb 32 one every four to six hours. Needs to go see podiatrist--advise her she can call on her own with open referrals thru [facility]. Also, clarify her meds

## 2012-10-12 NOTE — Telephone Encounter (Signed)
Still having foot pain.  Medication given last week is not working.  Does patient need to be seen or can she be prescribed something else for her foot?  [facility].  Also states her medication list is not correct on her after visit summary.  Please call patient.

## 2012-10-28 ENCOUNTER — Ambulatory Visit (INDEPENDENT_AMBULATORY_CARE_PROVIDER_SITE_OTHER): Payer: 59 | Admitting: Nurse Practitioner

## 2012-10-28 ENCOUNTER — Encounter: Payer: Self-pay | Admitting: Nurse Practitioner

## 2012-10-28 VITALS — BP 112/70 | Temp 98.4°F | Ht 62.0 in | Wt 167.4 lb

## 2012-10-28 DIAGNOSIS — J069 Acute upper respiratory infection, unspecified: Secondary | ICD-10-CM

## 2012-10-28 MED ORDER — AMOXICILLIN-POT CLAVULANATE 875-125 MG PO TABS: 1.0000 | ORAL_TABLET | Freq: Two times a day (BID) | ORAL | Status: DC

## 2012-10-28 MED ORDER — PHENTERMINE HCL 37.5 MG PO CAPS: 37.5000 mg | ORAL_CAPSULE | ORAL | Status: DC

## 2012-10-29 ENCOUNTER — Encounter: Payer: Self-pay | Admitting: Nurse Practitioner

## 2012-10-29 DIAGNOSIS — J069 Acute upper respiratory infection, unspecified: Secondary | ICD-10-CM | POA: Insufficient documentation

## 2012-10-29 NOTE — Assessment & Plan Note (Signed)
Augmentin prescribed. OTC meds as directed. Call back if worsens or persists.

## 2012-10-29 NOTE — Progress Notes (Signed)
Subjective:  Presents with complaints of right ear pain and sore throat that began 3 days ago. No fever. Facial area headache. Slight cough. No wheezing. No relief with OTC meds. Continues to struggle with her weight. Has taken phentermine it in the past. Has had to stop going to weight watchers due to cost. States that she stress eats and craves certain foods. Tries to stay active.  Objective:   BP 112/70  Temp(Src) 98.4 F (36.9 C) (Oral)  Ht 5\' 2"  (1.575 m)  Wt 167 lb 6.4 oz (75.932 kg)  BMI 30.61 kg/m2 NAD. Alert, oriented. TMs right side clear effusion, left side retracted. No erythema. Pharynx injected with PND noted. Neck supple with mild soft adenopathy. Lungs clear. Heart regular rate rhythm.

## 2012-10-29 NOTE — Assessment & Plan Note (Signed)
Restart phentermine as directed. Encourage healthy diet and regular activity. DC med and call if any problems. Otherwise recheck in 3 months.

## 2012-11-05 ENCOUNTER — Encounter (HOSPITAL_COMMUNITY): Payer: Self-pay | Admitting: Dietician

## 2012-11-05 NOTE — Progress Notes (Signed)
[facility] Diabetes Class Completion  Date:November 05, 2012  Time: 1730  Pt attended [facility]'s Diabetes [facility] Education Class on November 05, 2012.   Patient was educated on the following topics: survival skills (signs and symptoms of hyperglycemia and hypoglycemia, treatment for hypoglycemia, ideal levels for fasting and postprandial blood sugars, goal Hgb A1c level, foot care basics), recommendations for physical activity, carbohydrate metabolism in relation to diabetes, and meal planning (sources of carbohydrate, carbohydrate counting, meal planning strategies, food label reading, and portion control).   Britnie Colville A. Kayan, RD, LDN   

## 2013-01-07 ENCOUNTER — Ambulatory Visit (INDEPENDENT_AMBULATORY_CARE_PROVIDER_SITE_OTHER): Payer: 59 | Admitting: Nurse Practitioner

## 2013-01-07 ENCOUNTER — Encounter: Payer: Self-pay | Admitting: Nurse Practitioner

## 2013-01-07 VITALS — BP 134/80 | Temp 98.0°F | Wt 148.0 lb

## 2013-01-07 DIAGNOSIS — J069 Acute upper respiratory infection, unspecified: Secondary | ICD-10-CM

## 2013-01-07 MED ORDER — PREDNISONE 20 MG PO TABS: ORAL_TABLET | ORAL | Status: DC

## 2013-01-07 MED ORDER — ALBUTEROL SULFATE HFA 108 (90 BASE) MCG/ACT IN AERS: 2.0000 | INHALATION_SPRAY | RESPIRATORY_TRACT | Status: DC | PRN

## 2013-01-07 MED ORDER — AMOXICILLIN-POT CLAVULANATE 875-125 MG PO TABS: 1.0000 | ORAL_TABLET | Freq: Two times a day (BID) | ORAL | Status: DC

## 2013-01-07 NOTE — Patient Instructions (Addendum)
Nasacort AQ as directed Miralax as directed. Fleets enema or milk and molasses enema Magnesium citrate

## 2013-01-08 NOTE — Progress Notes (Signed)
Subjective:  Presents for complaints of sinus congestion for the past 6 days. Began with a sore throat. No fever. Now having facial area headache. Slight cough. Yellow nasal drainage. One episode wheezing. Ear pain. Bad taste in her mouth. No vomiting diarrhea or abdominal pain.  Objective:   BP 134/80  Temp(Src) 98 F (36.7 C) (Oral)  Wt 148 lb (67.132 kg)  BMI 27.06 kg/m2 NAD. Alert, oriented. TMs significant cloudy effusion, no erythema. Pharynx injected with PND noted. Neck supple with mild soft nontender adenopathy. Lungs clear. Heart regular rate rhythm. No tachypnea.  Assessment:Acute upper respiratory infection  Plan: Meds ordered this encounter  Medications  . albuterol (PROVENTIL HFA;VENTOLIN HFA) 108 (90 BASE) MCG/ACT inhaler    Sig: Inhale 2 puffs into the lungs every 4 (four) hours as needed for wheezing.    Dispense:  1 Inhaler    Refill:  5    Order Specific Question:  Supervising Provider    Answer:  Merlyn Albert [[id number]]  . amoxicillin-clavulanate (AUGMENTIN) 875-125 MG per tablet    Sig: Take 1 tablet by mouth 2 (two) times daily.    Dispense:  20 tablet    Refill:  0    Order Specific Question:  Supervising Provider    Answer:  Merlyn Albert [[id number]]  . predniSONE (DELTASONE) 20 MG tablet    Sig: 3 po qd x 3 d then 2 po qd x 3 d then 1 po qd x 3 d    Dispense:  18 tablet    Refill:  0    Order Specific Question:  Supervising Provider    Answer:  Riccardo Dubin   Given prescription for prednisone to have on hand over the weekend in case symptoms worsen. OTC meds as directed for congestion. Call back in 4-5 days if no improvement symptoms, sooner if worse.

## 2013-02-19 ENCOUNTER — Other Ambulatory Visit: Payer: Self-pay | Admitting: *Deleted

## 2013-02-19 DIAGNOSIS — M1990 Unspecified osteoarthritis, unspecified site: Secondary | ICD-10-CM

## 2013-02-19 MED ORDER — DICLOFENAC SODIUM 75 MG PO TBEC: 75.0000 mg | DELAYED_RELEASE_TABLET | Freq: Two times a day (BID) | ORAL | Status: AC

## 2013-02-25 ENCOUNTER — Telehealth: Payer: Self-pay | Admitting: Family Medicine

## 2013-02-25 MED ORDER — CYCLOBENZAPRINE HCL 10 MG PO TABS: 10.0000 mg | ORAL_TABLET | Freq: Four times a day (QID) | ORAL | Status: DC | PRN

## 2013-02-25 NOTE — Telephone Encounter (Signed)
Patient says her pinched nerve in her neck is flaring up and she is asking that we call in flexeril since she is at work and cant make it in today.-if no answer when calling patient back leave a message.     [facility]

## 2013-02-25 NOTE — Telephone Encounter (Signed)
Flex 10, numb 30 one q6hr prn one ref

## 2013-02-25 NOTE — Telephone Encounter (Signed)
Rx was sent into pharmacy. Left message on voicemail notifying patient rx sent into pharmacy.

## 2013-05-05 ENCOUNTER — Other Ambulatory Visit: Payer: Self-pay | Admitting: Family Medicine

## 2013-05-05 DIAGNOSIS — Z139 Encounter for screening, unspecified: Secondary | ICD-10-CM

## 2013-05-27 ENCOUNTER — Ambulatory Visit (HOSPITAL_COMMUNITY)
Admission: RE | Admit: 2013-05-27 | Discharge: 2013-05-27 | Disposition: A | Discharge status: Home / Self Care | Payer: 59 | Source: Ambulatory Visit | Attending: Family Medicine | Admitting: Family Medicine

## 2013-05-27 DIAGNOSIS — Z139 Encounter for screening, unspecified: Secondary | ICD-10-CM

## 2013-05-27 DIAGNOSIS — Z1231 Encounter for screening mammogram for malignant neoplasm of breast: Secondary | ICD-10-CM | POA: Insufficient documentation

## 2013-07-15 DIAGNOSIS — C50919 Malignant neoplasm of unspecified site of unspecified female breast: Secondary | ICD-10-CM

## 2013-07-15 HISTORY — DX: Malignant neoplasm of unspecified site of unspecified female breast: C50.919

## 2013-07-30 ENCOUNTER — Encounter: Payer: Self-pay | Admitting: Family Medicine

## 2013-07-30 ENCOUNTER — Ambulatory Visit (INDEPENDENT_AMBULATORY_CARE_PROVIDER_SITE_OTHER): Payer: 59 | Admitting: Family Medicine

## 2013-07-30 VITALS — BP 122/80 | Ht 62.0 in | Wt 147.4 lb

## 2013-07-30 DIAGNOSIS — J329 Chronic sinusitis, unspecified: Secondary | ICD-10-CM

## 2013-07-30 DIAGNOSIS — J310 Chronic rhinitis: Secondary | ICD-10-CM

## 2013-07-30 MED ORDER — CEFPROZIL 500 MG PO TABS: 500.0000 mg | ORAL_TABLET | Freq: Two times a day (BID) | ORAL | Status: DC

## 2013-07-30 NOTE — Progress Notes (Signed)
   Subjective:    Patient ID: Janice Higgins, female    DOB: 1956-01-20, 58 y.o.   MRN: [id number]  HPI Patient arrives with complaint of congestion, sore throat and dizziness for 3 weeks.  Dizziness is intermittent. Generally with change of position. Transient slight spitting.  Headache frontal in nature diminished energy. Sore throat worse in the morning. Some sense of chest congestion but mostly in the head.  Overall diminished energy and somewhat achy.  Review of Systems No chest pain no back pain no productive cough no change in bowel habits no blood in stool ROS otherwise negative    Objective:   Physical Exam  Alert moderate malaise. H&T moderate nasal frontal congestion tenderness. Normal neck supple. Lungs clear heart regular in rhythm.      Assessment & Plan:  Impression acute rhinosinusitis with elements of in her ear malfunction plan antibiotics prescribed. Symptomatic care discussed. Warning signs discussed. WSL

## 2013-10-18 ENCOUNTER — Ambulatory Visit (INDEPENDENT_AMBULATORY_CARE_PROVIDER_SITE_OTHER): Payer: 59 | Admitting: Family Medicine

## 2013-10-18 ENCOUNTER — Encounter: Payer: Self-pay | Admitting: Family Medicine

## 2013-10-18 VITALS — BP 122/82 | Temp 98.6°F | Ht 62.0 in | Wt 158.4 lb

## 2013-10-18 DIAGNOSIS — J310 Chronic rhinitis: Secondary | ICD-10-CM

## 2013-10-18 DIAGNOSIS — J329 Chronic sinusitis, unspecified: Secondary | ICD-10-CM

## 2013-10-18 MED ORDER — CEFDINIR 300 MG PO CAPS: 300.0000 mg | ORAL_CAPSULE | Freq: Two times a day (BID) | ORAL | Status: DC

## 2013-10-18 NOTE — Progress Notes (Signed)
   Subjective:    Patient ID: Janice Higgins, female    DOB: 1956/03/03, 58 y.o.   MRN: [id number]  HPI Patient arrives with complaint feeling bad. Patient states it started with headache Sat and drainage. Patient states she also having right ear pain, dizziness and sore throat.  Gets sig spring allergies  Pos exp to perfume and mau have triggered  Felt a little feverish  Mild coughing and sneezing, some in church yest  Felt dizziness , tiny amnt of wheeziness   Review of Systems No chest pain no back pain no abdominal pain no change in bowel habits ROS otherwise negative    Objective:   Physical Exam  Alert moderate malaise frontal tenderness pharynx erythematous neck tender anterior nodes otherwise supple lungs clear heart regular in rhythm.      Assessment & Plan:  Impression rhinosinusitis with lymphadenitis plan Omnicef twice a day 10 days. Symptomatic care discussed. Ventolin when necessary. Warning signs discussed. WSL

## 2013-12-09 ENCOUNTER — Other Ambulatory Visit: Payer: Self-pay | Admitting: Family Medicine

## 2013-12-13 ENCOUNTER — Ambulatory Visit (INDEPENDENT_AMBULATORY_CARE_PROVIDER_SITE_OTHER): Payer: 59 | Admitting: Nurse Practitioner

## 2013-12-13 ENCOUNTER — Encounter: Payer: Self-pay | Admitting: Nurse Practitioner

## 2013-12-13 VITALS — BP 112/72 | Ht 61.75 in | Wt 161.0 lb

## 2013-12-13 DIAGNOSIS — Z124 Encounter for screening for malignant neoplasm of cervix: Secondary | ICD-10-CM

## 2013-12-13 DIAGNOSIS — Z0000 Encounter for general adult medical examination without abnormal findings: Secondary | ICD-10-CM

## 2013-12-13 DIAGNOSIS — Z01419 Encounter for gynecological examination (general) (routine) without abnormal findings: Secondary | ICD-10-CM

## 2013-12-13 DIAGNOSIS — K219 Gastro-esophageal reflux disease without esophagitis: Secondary | ICD-10-CM

## 2013-12-13 MED ORDER — PHENTERMINE HCL 37.5 MG PO TABS: 37.5000 mg | ORAL_TABLET | Freq: Every day | ORAL | Status: DC

## 2013-12-13 NOTE — Patient Instructions (Signed)
Virtual colonoscopy (CT scan); [facility]

## 2013-12-14 LAB — PAP IG W/ RFLX HPV ASCU

## 2013-12-15 ENCOUNTER — Encounter: Payer: Self-pay | Admitting: Nurse Practitioner

## 2013-12-15 LAB — CBC WITH DIFFERENTIAL/PLATELET
Basophils Absolute: 0.1 10*3/uL (ref 0.0–0.1)
Basophils Relative: 1 % (ref 0–1)
Eosinophils Absolute: 0.1 10*3/uL (ref 0.0–0.7)
Eosinophils Relative: 2 % (ref 0–5)
HEMATOCRIT: 39.6 % (ref 36.0–46.0)
Hemoglobin: 13.6 g/dL (ref 12.0–15.0)
LYMPHS PCT: 40 % (ref 12–46)
Lymphs Abs: 2.2 10*3/uL (ref 0.7–4.0)
MCH: 30.2 pg (ref 26.0–34.0)
MCHC: 34.3 g/dL (ref 30.0–36.0)
MCV: 88 fL (ref 78.0–100.0)
Monocytes Absolute: 0.4 10*3/uL (ref 0.1–1.0)
Monocytes Relative: 8 % (ref 3–12)
NEUTROS ABS: 2.7 10*3/uL (ref 1.7–7.7)
Neutrophils Relative %: 49 % (ref 43–77)
Platelets: 327 10*3/uL (ref 150–400)
RBC: 4.5 MIL/uL (ref 3.87–5.11)
RDW: 13.4 % (ref 11.5–15.5)
WBC: 5.5 10*3/uL (ref 4.0–10.5)

## 2013-12-15 LAB — HEPATIC FUNCTION PANEL
ALK PHOS: 45 U/L (ref 39–117)
ALT: 33 U/L (ref 0–35)
AST: 25 U/L (ref 0–37)
Albumin: 4.2 g/dL (ref 3.5–5.2)
BILIRUBIN DIRECT: 0.1 mg/dL (ref 0.0–0.3)
BILIRUBIN INDIRECT: 0.5 mg/dL (ref 0.2–1.2)
Total Bilirubin: 0.6 mg/dL (ref 0.2–1.2)
Total Protein: 6.8 g/dL (ref 6.0–8.3)

## 2013-12-15 LAB — LIPID PANEL
Cholesterol: 165 mg/dL (ref 0–200)
HDL: 55 mg/dL (ref >39)
LDL CALC: 95 mg/dL (ref 0–99)
Total CHOL/HDL Ratio: 3 Ratio
Triglycerides: 73 mg/dL (ref <150)
VLDL: 15 mg/dL (ref 0–40)

## 2013-12-15 LAB — BASIC METABOLIC PANEL
BUN: 17 mg/dL (ref 6–23)
CALCIUM: 9.3 mg/dL (ref 8.4–10.5)
CO2: 27 meq/L (ref 19–32)
Chloride: 105 mEq/L (ref 96–112)
Creat: 0.58 mg/dL (ref 0.50–1.10)
Glucose, Bld: 92 mg/dL (ref 70–99)
POTASSIUM: 4.4 meq/L (ref 3.5–5.3)
SODIUM: 139 meq/L (ref 135–145)

## 2013-12-15 LAB — TSH: TSH: 3.79 u[IU]/mL (ref 0.350–4.500)

## 2013-12-15 NOTE — Progress Notes (Signed)
Subjective:    Patient ID: Janice Higgins, female    DOB: 08-31-1955, 58 y.o.   MRN: [id number]  HPI presents for her wellness exam. Regular vision and dental exams. Having headaches lately. No numbness or weakness of face, arms or legs. Some relief with Diclofenac. Has not had colonoscopy. No vaginal bleeding. No pelvic pain. Same sexual partner.     Review of Systems  Constitutional: Negative for fever, activity change and appetite change.  HENT: Positive for congestion and rhinorrhea. Negative for dental problem, ear pain, sinus pressure and sore throat.   Respiratory: Negative for cough, chest tightness, shortness of breath and wheezing.   Cardiovascular: Negative for chest pain and leg swelling.  Gastrointestinal: Negative for nausea, vomiting, abdominal pain, diarrhea, constipation, blood in stool and abdominal distention.  Genitourinary: Negative for dysuria, urgency, frequency, vaginal bleeding, vaginal discharge, enuresis, difficulty urinating, genital sores, menstrual problem and pelvic pain.  Neurological: Positive for headaches. Negative for syncope, facial asymmetry, weakness and numbness.       Objective:   Physical Exam  Vitals reviewed. Constitutional: She is oriented to person, place, and time. She appears well-developed and well-nourished. No distress.  HENT:  Right Ear: External ear normal.  Left Ear: External ear normal.  Mouth/Throat: Oropharynx is clear and moist.  Neck: Normal range of motion. Neck supple. No tracheal deviation present. No thyromegaly present.  Cardiovascular: Normal rate, regular rhythm and normal heart sounds.  Exam reveals no gallop.   No murmur heard. Pulmonary/Chest: Effort normal and breath sounds normal.  Abdominal: Soft. Bowel sounds are normal. She exhibits no distension and no mass. There is no tenderness.  Genitourinary: Vagina normal and uterus normal. No vaginal discharge found.  External GU: no lesions or masses. Vagina: slightly  pale; no discharge. Bimanual exam: no tenderness; ovaries nonpalp. Rectal exam: no masses; no stool for hemoccult.  Musculoskeletal: Normal range of motion. She exhibits no edema.  Lymphadenopathy:    She has no cervical adenopathy.  Neurological: She is alert and oriented to person, place, and time.  Skin: Skin is warm and dry. No rash noted.  Psychiatric: She has a normal mood and affect. Her behavior is normal.  Breast exam: no masses; axillae no adenopathy.        Assessment & Plan:   Problem List Items Addressed This Visit     Other   Morbid obesity   Relevant Medications      phentermine (ADIPEX-P) 37.5 MG tablet    Other Visit Diagnoses   Well woman exam    -  Primary    Relevant Orders       Basic metabolic panel       CBC with Differential       Hepatic function panel       Lipid panel       TSH    Screening for cervical cancer        Relevant Orders       Pap IG w/ reflex to HPV when ASC-U (Completed)    GERD (gastroesophageal reflux disease)          Meds ordered this encounter  Medications  . phentermine (ADIPEX-P) 37.5 MG tablet    Sig: Take 1 tablet (37.5 mg total) by mouth daily before breakfast.    Dispense:  30 tablet    Refill:  2    Order Specific Question:  Supervising Provider    Answer:  Mikey Kirschner [[id number]]   Increase activity. Healthy diet.  Daily vitamin D and calcium. Given info on colonoscopy screening. Return in about 3 months (around 03/15/2014).

## 2013-12-16 ENCOUNTER — Encounter: Payer: Self-pay | Admitting: Nurse Practitioner

## 2014-05-16 ENCOUNTER — Other Ambulatory Visit: Payer: Self-pay | Admitting: Family Medicine

## 2014-05-16 DIAGNOSIS — Z1231 Encounter for screening mammogram for malignant neoplasm of breast: Secondary | ICD-10-CM

## 2014-05-30 ENCOUNTER — Ambulatory Visit (HOSPITAL_COMMUNITY)
Admission: RE | Admit: 2014-05-30 | Discharge: 2014-05-30 | Disposition: A | Discharge status: Home / Self Care | Payer: 59 | Source: Ambulatory Visit | Attending: Family Medicine | Admitting: Family Medicine

## 2014-05-30 DIAGNOSIS — Z1231 Encounter for screening mammogram for malignant neoplasm of breast: Secondary | ICD-10-CM | POA: Diagnosis present

## 2014-05-30 DIAGNOSIS — R928 Other abnormal and inconclusive findings on diagnostic imaging of breast: Secondary | ICD-10-CM | POA: Insufficient documentation

## 2014-06-02 ENCOUNTER — Other Ambulatory Visit: Payer: Self-pay | Admitting: Family Medicine

## 2014-06-02 DIAGNOSIS — R928 Other abnormal and inconclusive findings on diagnostic imaging of breast: Secondary | ICD-10-CM

## 2014-06-13 ENCOUNTER — Ambulatory Visit (INDEPENDENT_AMBULATORY_CARE_PROVIDER_SITE_OTHER): Payer: 59 | Admitting: Family Medicine

## 2014-06-13 ENCOUNTER — Encounter: Payer: Self-pay | Admitting: Family Medicine

## 2014-06-13 VITALS — BP 124/80 | Temp 99.0°F | Ht 61.5 in | Wt 163.0 lb

## 2014-06-13 DIAGNOSIS — J019 Acute sinusitis, unspecified: Secondary | ICD-10-CM

## 2014-06-13 DIAGNOSIS — J208 Acute bronchitis due to other specified organisms: Secondary | ICD-10-CM

## 2014-06-13 DIAGNOSIS — J069 Acute upper respiratory infection, unspecified: Secondary | ICD-10-CM

## 2014-06-13 MED ORDER — CEFTRIAXONE SODIUM 1 G IJ SOLR
500.0000 mg | Freq: Once | INTRAMUSCULAR | Status: AC
  Administered 2014-06-13: 500 mg via INTRAMUSCULAR

## 2014-06-13 MED ORDER — ALBUTEROL SULFATE HFA 108 (90 BASE) MCG/ACT IN AERS: 2.0000 | INHALATION_SPRAY | RESPIRATORY_TRACT | Status: DC | PRN

## 2014-06-13 MED ORDER — AZITHROMYCIN 250 MG PO TABS: ORAL_TABLET | ORAL | Status: DC

## 2014-06-13 NOTE — Progress Notes (Signed)
   Subjective:    Patient ID: Janice Higgins, female    DOB: 09-25-1955, 58 y.o.   MRN: [id number]  Cough This is a new problem. The current episode started yesterday. Associated symptoms include a fever, headaches, myalgias and wheezing. nyquil, advil   Requesting refill on inhaler. PMH benign  Review of Systems  Constitutional: Positive for fever.  Respiratory: Positive for cough and wheezing.   Musculoskeletal: Positive for myalgias.  Neurological: Positive for headaches.       Objective:   Physical Exam lungs are clear currently cough is noted mild sinus tenderness eardrums normal throat is normal neck supple      Assessment & Plan:  Acute rhinosinusitis antibiotic shot given prescription sent in patient states she cannot get it filled until tomorrow. Patient also requests prescription for inhaler in case she needs it.

## 2014-06-15 ENCOUNTER — Telehealth: Payer: Self-pay | Admitting: Family Medicine

## 2014-06-15 ENCOUNTER — Other Ambulatory Visit: Payer: Self-pay | Admitting: *Deleted

## 2014-06-15 MED ORDER — HYDROCODONE-HOMATROPINE 5-1.5 MG/5ML PO SYRP: 5.0000 mL | ORAL_SOLUTION | Freq: Every evening | ORAL | Status: DC | PRN

## 2014-06-15 NOTE — Telephone Encounter (Signed)
Patient was seen on Monday by Dr. Nicki Reaper and now has a real bad cough and wants something called into [place]

## 2014-06-15 NOTE — Telephone Encounter (Signed)
Spoke with pt. She wanted something for her nagging cough. RX for Hycodan per Dr. Nicki Reaper. Pt notified.

## 2014-06-21 ENCOUNTER — Other Ambulatory Visit: Payer: Self-pay | Admitting: Family Medicine

## 2014-06-21 ENCOUNTER — Ambulatory Visit (HOSPITAL_COMMUNITY)
Admission: RE | Admit: 2014-06-21 | Discharge: 2014-06-21 | Disposition: A | Discharge status: Home / Self Care | Payer: 59 | Source: Ambulatory Visit | Attending: Family Medicine | Admitting: Family Medicine

## 2014-06-21 DIAGNOSIS — R928 Other abnormal and inconclusive findings on diagnostic imaging of breast: Secondary | ICD-10-CM | POA: Insufficient documentation

## 2014-06-28 ENCOUNTER — Ambulatory Visit (HOSPITAL_COMMUNITY)
Admission: RE | Admit: 2014-06-28 | Discharge: 2014-06-28 | Disposition: A | Discharge status: Home / Self Care | Payer: 59 | Source: Ambulatory Visit | Attending: Family Medicine | Admitting: Family Medicine

## 2014-06-28 ENCOUNTER — Encounter (HOSPITAL_COMMUNITY): Payer: Self-pay

## 2014-06-28 ENCOUNTER — Other Ambulatory Visit: Payer: Self-pay | Admitting: Family Medicine

## 2014-06-28 DIAGNOSIS — N63 Unspecified lump in breast: Secondary | ICD-10-CM | POA: Insufficient documentation

## 2014-06-28 DIAGNOSIS — N6320 Unspecified lump in the left breast, unspecified quadrant: Secondary | ICD-10-CM

## 2014-06-28 DIAGNOSIS — R928 Other abnormal and inconclusive findings on diagnostic imaging of breast: Secondary | ICD-10-CM

## 2014-06-28 MED ORDER — LIDOCAINE HCL (PF) 2 % IJ SOLN
INTRAMUSCULAR | Status: AC
  Administered 2014-06-28: 200 mg
  Filled 2014-06-28: qty 10

## 2014-06-28 NOTE — Discharge Instructions (Signed)
Breast Biopsy °A breast biopsy is a test during which a sample of tissue is taken from your breast. The breast tissue is looked at under a microscope for cancer cells.  °BEFORE THE PROCEDURE °· Make plans to have someone drive you home after the test. °· Do not smoke for 2 weeks before the test. Stop smoking, if you smoke. °· Do not drink alcohol for 24 hours before the test. °· Wear a good support bra to the test. °PROCEDURE  °You may be given one of the following: °· A medicine to numb the breast area (local anesthetic). °· A medicine to make you fall asleep (general anesthetic). °There are different types of breast biopsies. They include: °· Fine-needle aspiration. °¨ A needle is put into the breast lump. °¨ The needle takes out fluid and cells from the lump. °¨ Ultrasound imaging may be used to help find the lump and to put the needle in the right spot. °· Core-needle biopsy. °¨ A needle is put into the breast lump. °¨ The needle is put in your breast 3-6 times. °¨ The needle removes breast tissue. °¨ An ultrasound image or X-ray is often used to find the right spot to put in the needle. °· Stereotactic biopsy. °¨ X-rays and a computer are used to study X-ray pictures of the breast lump. °¨ The computer finds where the needle needs to be put into the breast. °¨ Tissue samples are taken out. °· Vacuum-assisted biopsy. °¨ A small cut (incision) is made in your breast. °¨ A biopsy device is put through the cut and into the breast tissue. °¨ The biopsy device draws abnormal breast tissue into the biopsy device. °¨ A large tissue sample is often removed. °¨ No stitches are needed. °· Ultrasound-guided core-needle biopsy. °¨ Ultrasound imaging helps guide the needle into the area of the breast that is not normal. °¨ A cut is made in the breast. The needle is put into the breast lump. °¨ Tissue samples are taken out. °· Open biopsy. °¨ A large cut is made in the breast. °¨ Your doctor will try to remove the whole  breast lump or as much as possible. °All tissue, fluid, or cell samples are looked at under a microscope.  °AFTER THE PROCEDURE °· You will be taken to an area to recover. You will be able to go home once you are doing well and are without problems. °· You may have bruising on your breast. This is normal. °· A pressure bandage (dressing) may be put on your breast for 24-48 hours. This type of bandage is wrapped tightly around your chest. It helps stop fluid from building up underneath tissues. °Document Released: 09/23/2011 Document Revised: 11/15/2013 Document Reviewed: 09/23/2011 °ExitCare® Patient Information ©2015 [employer]. This information is not intended to replace advice given to you by your health care provider. Make sure you discuss any questions you have with your health care provider. °Breast Biopsy °Care After °These instructions give you information on caring for yourself after your procedure. Your doctor may also give you more specific instructions. Call your doctor if you have any problems or questions after your procedure. °HOME CARE °· Only take medicine as told by your doctor. °· Do not take aspirin. °· Keep your sutures (stitches) dry when bathing. °· Protect the biopsy area. Do not let the area get bumped. °· Avoid activities that could pull the biopsy site open until your doctor approves. This includes: °· Stretching. °· Reaching. °· Exercise. °·   Sports. °· Lifting more than 3lb. °· Continue your normal diet. °· Wear a good support bra for as long as told by your doctor. °· Change any bandages (dressings) as told by your doctor. °· Do not drink alcohol while taking pain medicine. °· Keep all doctor visits as told. Ask when your test results will be ready. Make sure you get your test results. °GET HELP RIGHT AWAY IF:  °· You have a fever. °· You have more bleeding (more than a small spot) from the biopsy site. °· You have trouble breathing. °· You have yellowish-white fluid (pus) coming from  the biopsy site. °· You have redness, puffiness (swelling), or more pain in the biopsy site. °· You have a bad smell coming from the biopsy site. °· Your biopsy site opens after sutures, staples, or sticky strips have been removed. °· You have a rash. °· You need stronger medicine. °MAKE SURE YOU: °· Understand these instructions. °· Will watch your condition. °· Will get help right away if you are not doing well or get worse. °Document Released: 04/27/2009 Document Revised: 09/23/2011 Document Reviewed: 08/11/2011 °ExitCare® Patient Information ©2015 [employer]. This information is not intended to replace advice given to you by your health care provider. Make sure you discuss any questions you have with your health care provider. ° °

## 2014-06-30 ENCOUNTER — Ambulatory Visit (INDEPENDENT_AMBULATORY_CARE_PROVIDER_SITE_OTHER): Payer: 59 | Admitting: Family Medicine

## 2014-06-30 DIAGNOSIS — N6012 Diffuse cystic mastopathy of left breast: Secondary | ICD-10-CM

## 2014-06-30 DIAGNOSIS — D242 Benign neoplasm of left breast: Secondary | ICD-10-CM

## 2014-06-30 NOTE — Progress Notes (Signed)
   Subjective:    Patient ID: Janice Higgins, female    DOB: 1956/04/06, 58 y.o.   MRN: [id number]  HPIFollow up on biopsy results.   Patient has no close family history of breast cancer. Had breast abnormalities show up on mammogram. Further ultrasound revealed a concerning area.  This was biopsied. Returned as ductal papilloma. Radiologist recommended that this be excised.   Numb this aft [phone]  Cell [id number] Review of Systems No chest pain no headache no back pain no abdominal pain no change in bowel habits no blood in stool    Objective:   Physical Exam Alert understandable anxiety. H&T normal. Lungs clear. Heart regular in rhythm. Vitals stable       Assessment & Plan:  Impression ductal papilloma discussed at great length with patient and spouse. Multiple questions answered. Rarely this can be associated with other more serious difficulties so complete excision warranted. Plan surgery referral. Multiple questions answered. Easily 25 minutes spent in discussion. WSL

## 2014-07-03 DIAGNOSIS — N6012 Diffuse cystic mastopathy of left breast: Secondary | ICD-10-CM | POA: Insufficient documentation

## 2014-07-15 DIAGNOSIS — D0512 Intraductal carcinoma in situ of left breast: Secondary | ICD-10-CM

## 2014-07-15 HISTORY — DX: Intraductal carcinoma in situ of left breast: D05.12

## 2014-07-29 ENCOUNTER — Other Ambulatory Visit (INDEPENDENT_AMBULATORY_CARE_PROVIDER_SITE_OTHER): Payer: Self-pay | Admitting: General Surgery

## 2014-07-29 DIAGNOSIS — D242 Benign neoplasm of left breast: Secondary | ICD-10-CM

## 2014-08-02 ENCOUNTER — Other Ambulatory Visit (INDEPENDENT_AMBULATORY_CARE_PROVIDER_SITE_OTHER): Payer: Self-pay | Admitting: General Surgery

## 2014-08-02 DIAGNOSIS — D242 Benign neoplasm of left breast: Secondary | ICD-10-CM

## 2014-08-09 ENCOUNTER — Encounter (HOSPITAL_BASED_OUTPATIENT_CLINIC_OR_DEPARTMENT_OTHER): Payer: Self-pay | Admitting: *Deleted

## 2014-08-09 NOTE — Progress Notes (Signed)
No labs per anesth needed

## 2014-08-11 ENCOUNTER — Ambulatory Visit
Admission: RE | Admit: 2014-08-11 | Discharge: 2014-08-11 | Disposition: A | Discharge status: Home / Self Care | Payer: 59 | Source: Ambulatory Visit | Attending: General Surgery | Admitting: General Surgery

## 2014-08-11 DIAGNOSIS — D242 Benign neoplasm of left breast: Secondary | ICD-10-CM

## 2014-08-12 ENCOUNTER — Encounter (HOSPITAL_BASED_OUTPATIENT_CLINIC_OR_DEPARTMENT_OTHER)
Admission: RE | Disposition: A | Discharge status: Home / Self Care | Payer: Self-pay | Source: Ambulatory Visit | Attending: General Surgery

## 2014-08-12 ENCOUNTER — Ambulatory Visit (HOSPITAL_BASED_OUTPATIENT_CLINIC_OR_DEPARTMENT_OTHER): Payer: 59 | Admitting: Certified Registered"

## 2014-08-12 ENCOUNTER — Ambulatory Visit (HOSPITAL_BASED_OUTPATIENT_CLINIC_OR_DEPARTMENT_OTHER)
Admission: RE | Admit: 2014-08-12 | Discharge: 2014-08-12 | Disposition: A | Discharge status: Home / Self Care | Payer: 59 | Source: Ambulatory Visit | Attending: General Surgery | Admitting: General Surgery

## 2014-08-12 ENCOUNTER — Ambulatory Visit
Admission: RE | Admit: 2014-08-12 | Discharge: 2014-08-12 | Disposition: A | Discharge status: Home / Self Care | Payer: 59 | Source: Ambulatory Visit | Attending: General Surgery | Admitting: General Surgery

## 2014-08-12 ENCOUNTER — Encounter (HOSPITAL_BASED_OUTPATIENT_CLINIC_OR_DEPARTMENT_OTHER): Payer: Self-pay | Admitting: Certified Registered"

## 2014-08-12 DIAGNOSIS — M1990 Unspecified osteoarthritis, unspecified site: Secondary | ICD-10-CM | POA: Insufficient documentation

## 2014-08-12 DIAGNOSIS — D242 Benign neoplasm of left breast: Secondary | ICD-10-CM

## 2014-08-12 DIAGNOSIS — Z87442 Personal history of urinary calculi: Secondary | ICD-10-CM | POA: Insufficient documentation

## 2014-08-12 DIAGNOSIS — D0512 Intraductal carcinoma in situ of left breast: Secondary | ICD-10-CM | POA: Diagnosis not present

## 2014-08-12 DIAGNOSIS — N6012 Diffuse cystic mastopathy of left breast: Secondary | ICD-10-CM

## 2014-08-12 HISTORY — PX: BREAST LUMPECTOMY WITH RADIOACTIVE SEED LOCALIZATION: SHX6424

## 2014-08-12 LAB — POCT HEMOGLOBIN-HEMACUE: HEMOGLOBIN: 13.6 g/dL (ref 12.0–15.0)

## 2014-08-12 SURGERY — BREAST LUMPECTOMY WITH RADIOACTIVE SEED LOCALIZATION
Anesthesia: General | Site: Breast | Laterality: Left

## 2014-08-12 MED ORDER — BUPIVACAINE-EPINEPHRINE (PF) 0.25% -1:200000 IJ SOLN
INTRAMUSCULAR | Status: DC | PRN
  Administered 2014-08-12: 20 mL via PERINEURAL

## 2014-08-12 MED ORDER — FENTANYL CITRATE 0.05 MG/ML IJ SOLN
INTRAMUSCULAR | Status: DC | PRN
  Administered 2014-08-12: 50 ug via INTRAVENOUS
  Administered 2014-08-12: 25 ug via INTRAVENOUS
  Administered 2014-08-12: 50 ug via INTRAVENOUS

## 2014-08-12 MED ORDER — MIDAZOLAM HCL 2 MG/2ML IJ SOLN
INTRAMUSCULAR | Status: AC
  Filled 2014-08-12: qty 2

## 2014-08-12 MED ORDER — CEFAZOLIN SODIUM-DEXTROSE 2-3 GM-% IV SOLR
INTRAVENOUS | Status: AC
  Filled 2014-08-12: qty 50

## 2014-08-12 MED ORDER — FENTANYL CITRATE 0.05 MG/ML IJ SOLN: 50.0000 ug | INTRAMUSCULAR | Status: DC | PRN

## 2014-08-12 MED ORDER — HYDROMORPHONE HCL 1 MG/ML IJ SOLN
0.2500 mg | INTRAMUSCULAR | Status: DC | PRN
  Administered 2014-08-12 (×2): 0.5 mg via INTRAVENOUS

## 2014-08-12 MED ORDER — FENTANYL CITRATE 0.05 MG/ML IJ SOLN
INTRAMUSCULAR | Status: AC
  Filled 2014-08-12: qty 6

## 2014-08-12 MED ORDER — HYDROCODONE-ACETAMINOPHEN 5-325 MG PO TABS
ORAL_TABLET | ORAL | Status: AC
  Filled 2014-08-12: qty 1

## 2014-08-12 MED ORDER — ONDANSETRON HCL 4 MG/2ML IJ SOLN
INTRAMUSCULAR | Status: DC | PRN
  Administered 2014-08-12: 4 mg via INTRAVENOUS

## 2014-08-12 MED ORDER — HYDROCODONE-ACETAMINOPHEN 5-325 MG PO TABS: 1.0000 | ORAL_TABLET | ORAL | Status: DC | PRN

## 2014-08-12 MED ORDER — HYDROMORPHONE HCL 1 MG/ML IJ SOLN
INTRAMUSCULAR | Status: AC
  Filled 2014-08-12: qty 1

## 2014-08-12 MED ORDER — MIDAZOLAM HCL 5 MG/5ML IJ SOLN
INTRAMUSCULAR | Status: DC | PRN
  Administered 2014-08-12: 2 mg via INTRAVENOUS

## 2014-08-12 MED ORDER — DEXAMETHASONE SODIUM PHOSPHATE 4 MG/ML IJ SOLN
INTRAMUSCULAR | Status: DC | PRN
  Administered 2014-08-12: 10 mg via INTRAVENOUS

## 2014-08-12 MED ORDER — MIDAZOLAM HCL 2 MG/2ML IJ SOLN: 1.0000 mg | INTRAMUSCULAR | Status: DC | PRN

## 2014-08-12 MED ORDER — LACTATED RINGERS IV SOLN
INTRAVENOUS | Status: DC
  Administered 2014-08-12 (×2): via INTRAVENOUS

## 2014-08-12 MED ORDER — HYDROCODONE-ACETAMINOPHEN 5-325 MG PO TABS
1.0000 | ORAL_TABLET | ORAL | Status: DC | PRN
  Administered 2014-08-12: 1 via ORAL

## 2014-08-12 MED ORDER — LIDOCAINE HCL (CARDIAC) 20 MG/ML IV SOLN
INTRAVENOUS | Status: DC | PRN
  Administered 2014-08-12: 30 mg via INTRAVENOUS

## 2014-08-12 MED ORDER — PROPOFOL 10 MG/ML IV BOLUS
INTRAVENOUS | Status: DC | PRN
  Administered 2014-08-12: 150 mg via INTRAVENOUS

## 2014-08-12 MED ORDER — CEFAZOLIN SODIUM-DEXTROSE 2-3 GM-% IV SOLR
2.0000 g | INTRAVENOUS | Status: AC
  Administered 2014-08-12: 2 g via INTRAVENOUS

## 2014-08-12 SURGICAL SUPPLY — 50 items
APPLIER CLIP 9.375 MED OPEN (MISCELLANEOUS) ×2
APR CLP MED 9.3 20 MLT OPN (MISCELLANEOUS) ×1
BINDER BREAST LRG (GAUZE/BANDAGES/DRESSINGS) IMPLANT
BINDER BREAST MEDIUM (GAUZE/BANDAGES/DRESSINGS) IMPLANT
BINDER BREAST XLRG (GAUZE/BANDAGES/DRESSINGS) IMPLANT
BINDER BREAST XXLRG (GAUZE/BANDAGES/DRESSINGS) IMPLANT
BLADE SURG 15 STRL LF DISP TIS (BLADE) ×1 IMPLANT
BLADE SURG 15 STRL SS (BLADE) ×2
CANISTER SUC SOCK COL 7IN (MISCELLANEOUS) IMPLANT
CANISTER SUCT 1200ML W/VALVE (MISCELLANEOUS) IMPLANT
CHLORAPREP W/TINT 26ML (MISCELLANEOUS) ×2 IMPLANT
CLIP APPLIE 9.375 MED OPEN (MISCELLANEOUS) ×1 IMPLANT
COVER BACK TABLE 60X90IN (DRAPES) ×2 IMPLANT
COVER MAYO STAND STRL (DRAPES) ×2 IMPLANT
COVER PROBE W GEL 5X96 (DRAPES) ×2 IMPLANT
DECANTER SPIKE VIAL GLASS SM (MISCELLANEOUS) IMPLANT
DEVICE DUBIN W/COMP PLATE 8390 (MISCELLANEOUS) ×2 IMPLANT
DRAPE LAPAROSCOPIC ABDOMINAL (DRAPES) ×2 IMPLANT
DRAPE UTILITY XL STRL (DRAPES) ×2 IMPLANT
ELECT COATED BLADE 2.86 ST (ELECTRODE) ×2 IMPLANT
ELECT REM PT RETURN 9FT ADLT (ELECTROSURGICAL) ×2
ELECTRODE REM PT RTRN 9FT ADLT (ELECTROSURGICAL) ×1 IMPLANT
GLOVE BIOGEL M STRL SZ7.5 (GLOVE) ×1 IMPLANT
GLOVE BIOGEL PI IND STRL 8 (GLOVE) ×1 IMPLANT
GLOVE BIOGEL PI INDICATOR 8 (GLOVE) ×2
GLOVE ECLIPSE 7.5 STRL STRAW (GLOVE) ×2 IMPLANT
GOWN STRL REUS W/ TWL LRG LVL3 (GOWN DISPOSABLE) ×1 IMPLANT
GOWN STRL REUS W/ TWL XL LVL3 (GOWN DISPOSABLE) ×1 IMPLANT
GOWN STRL REUS W/TWL LRG LVL3 (GOWN DISPOSABLE) ×2
GOWN STRL REUS W/TWL XL LVL3 (GOWN DISPOSABLE) ×2
KIT MARKER MARGIN INK (KITS) ×2 IMPLANT
LIQUID BAND (GAUZE/BANDAGES/DRESSINGS) ×2 IMPLANT
NDL HYPO 25X1 1.5 SAFETY (NEEDLE) ×1 IMPLANT
NDL SAFETY ECLIPSE 18X1.5 (NEEDLE) IMPLANT
NEEDLE HYPO 18GX1.5 SHARP (NEEDLE)
NEEDLE HYPO 25X1 1.5 SAFETY (NEEDLE) ×2 IMPLANT
NS IRRIG 1000ML POUR BTL (IV SOLUTION) IMPLANT
PACK BASIN DAY SURGERY FS (CUSTOM PROCEDURE TRAY) ×2 IMPLANT
PENCIL BUTTON HOLSTER BLD 10FT (ELECTRODE) ×2 IMPLANT
SLEEVE SCD COMPRESS KNEE MED (MISCELLANEOUS) ×2 IMPLANT
SPONGE LAP 4X18 X RAY DECT (DISPOSABLE) ×2 IMPLANT
SUT MNCRL AB 4-0 PS2 18 (SUTURE) ×2 IMPLANT
SUT SILK 2 0 SH (SUTURE) IMPLANT
SUT VIC AB 3-0 SH 27 (SUTURE) ×2
SUT VIC AB 3-0 SH 27X BRD (SUTURE) ×1 IMPLANT
SYR CONTROL 10ML LL (SYRINGE) ×2 IMPLANT
TOWEL OR 17X24 6PK STRL BLUE (TOWEL DISPOSABLE) ×2 IMPLANT
TOWEL OR NON WOVEN STRL DISP B (DISPOSABLE) ×2 IMPLANT
TUBE CONNECTING 20X1/4 (TUBING) IMPLANT
YANKAUER SUCT BULB TIP NO VENT (SUCTIONS) IMPLANT

## 2014-08-12 NOTE — Interval H&P Note (Signed)
History and Physical Interval Note:  08/12/2014 9:16 AM  Janice Higgins  has presented today for surgery, with the diagnosis of left breast papilloma  The various methods of treatment have been discussed with the patient and family. After consideration of risks, benefits and other options for treatment, the patient has consented to  Procedure(s):  RADIOACTIVE SEED LOCALIZATION LEFT BREAST LUMPECTOMY (Left) as a surgical intervention .  The patient's history has been reviewed, patient examined, no change in status, stable for surgery.  I have reviewed the patient's chart and labs.  Questions were answered to the patient's satisfaction.     Shemia Bevel T

## 2014-08-12 NOTE — Anesthesia Preprocedure Evaluation (Addendum)
Anesthesia Evaluation  Patient identified by MRN, date of birth, ID band Patient awake    Reviewed: Allergy & Precautions, H&P , NPO status , Patient's Chart, lab work & pertinent test results  Airway Mallampati: II  TM Distance: >3 FB Neck ROM: Full    Dental no notable dental hx. (+) Teeth Intact, Dental Advisory Given   Pulmonary neg pulmonary ROS,  breath sounds clear to auscultation  Pulmonary exam normal       Cardiovascular negative cardio ROS  Rhythm:Regular Rate:Normal     Neuro/Psych negative neurological ROS  negative psych ROS   GI/Hepatic negative GI ROS, Neg liver ROS,   Endo/Other  negative endocrine ROS  Renal/GU negative Renal ROS  negative genitourinary   Musculoskeletal  (+) Arthritis -, Osteoarthritis,    Abdominal   Peds  Hematology negative hematology ROS (+)   Anesthesia Other Findings   Reproductive/Obstetrics negative OB ROS                            Anesthesia Physical Anesthesia Plan  ASA: II  Anesthesia Plan: General   Post-op Pain Management:    Induction: Intravenous  Airway Management Planned: LMA  Additional Equipment:   Intra-op Plan:   Post-operative Plan: Extubation in OR  Informed Consent: I have reviewed the patients History and Physical, chart, labs and discussed the procedure including the risks, benefits and alternatives for the proposed anesthesia with the patient or authorized representative who has indicated his/her understanding and acceptance.   Dental advisory given  Plan Discussed with: CRNA  Anesthesia Plan Comments:         Anesthesia Quick Evaluation

## 2014-08-12 NOTE — Op Note (Signed)
Preoperative Diagnosis: left breast papilloma  Postoprative Diagnosis: left breast papilloma  Procedure: Procedure(s):  RADIOACTIVE SEED LOCALIZATION LEFT BREAST LUMPECTOMY   Surgeon: Excell Seltzer T   Assistants: None  Anesthesia:  General LMA anesthesia  Indications: Patient is a 59 year old female with a recently detected left breast mass on screening mammogram. Large core needle biopsy has revealed papilloma. We have discussed options including excision versus observation and elected to proceed with excision to rule out underlying malignancy. We discussed the procedure and risks extensively detailed elsewhere.    Procedure Detail: Patient had previously undergone accurate radioiodine seed placement in the left breast the day prior to surgery. Seed placement was confirmed with the neoprobe in the holding area. Patient was taken operating room, placed in supine position on the operating table, and laryngeal mask general anesthesia induced. She received preoperative IV antibiotics. PAS were in place. The left breast was widely sterilely prepped and draped. Patient timeout was performed and correct procedure verified. The neoprobe was used to localize the seed in the lateral left breast and a curvilinear incision was made at this point the skin crease and dissection carried down through the subcutaneous tissue to the breast capsule. Using the neoprobe to localize a spherical moderate sized specimen of breast tissue was excised. Ex vivo the seed appeared to be centrally located with the neoprobe. The specimen was oriented with sutures. Specimen mammogram showed the seton marking clip centrally located within the specimen. The breast tissue was infiltrated with Marcaine with epinephrine and complete hemostasis obtained. Breast and subcutaneous tissue was closed with interrupted 3-0 Vicryl. Skin was closed with subcuticular 4-0 Monocryl and Dermabond. Sponge needle and instrument counts were  correct    Findings: As above  Estimated Blood Loss:  Minimal         Drains: none  Blood Given: none          Specimens: Left breast lumpectomy        Complications:  * No complications entered in OR log *         Disposition: PACU - hemodynamically stable.         Condition: stable

## 2014-08-12 NOTE — Anesthesia Procedure Notes (Signed)
Procedure Name: LMA Insertion Date/Time: 08/12/2014 9:33 AM Performed by: Havanna Groner Pre-anesthesia Checklist: Patient identified, Emergency Drugs available, Suction available and Patient being monitored Patient Re-evaluated:Patient Re-evaluated prior to inductionOxygen Delivery Method: Circle System Utilized Preoxygenation: Pre-oxygenation with 100% oxygen Intubation Type: IV induction Ventilation: Mask ventilation without difficulty LMA: LMA inserted LMA Size: 4.0 Number of attempts: 1 Airway Equipment and Method: Bite block Placement Confirmation: positive ETCO2 Tube secured with: Tape Dental Injury: Teeth and Oropharynx as per pre-operative assessment

## 2014-08-12 NOTE — H&P (Signed)
Janice Higgins 07/29/2014 1:49 PM Location: [facility] Patient #: [id number] DOB: [birth year] Married / Language: Cleophus Molt / Race: White Female  History of Present Illness Marland Kitchen T. Jadamarie Butson MD; 07/29/2014 2:08 PM) Patient words: New Left Breast.  The patient is a 59 year old female who presents with a breast mass. She recently presented for her routine screening mammogram. This showed an area of asymmetry in the left breast and further imaging was recommended. Diagnostic mammogram with spot compression views revealed a small oval mass laterally in the left breast at the 3 o'clock position. Ultrasound confirmed a slightly irregular oval hypoechoic mass measuring 5 x 5 x 4 mm at the 3:30 position in the left breast 4 cm from the nipple. A large core needle biopsy was recommended and performed. This has revealed a ductal papilloma. Complete excision has been recommended. She has experienced no breast symptoms specifically lump or nipple discharge or skin changes or pain. She has no personal history or family history of breast problems or breast cancer. She has not had any problems since the biopsy.   Other Problems Erasmo Leventhal, RN, BSN; 07/29/2014 1:49 PM) Arthritis Hemorrhoids Kidney Stone  Past Surgical History Erasmo Leventhal, RN, BSN; 07/29/2014 1:49 PM) Oral Surgery Tonsillectomy  Diagnostic Studies History (Erasmo Leventhal, RN, BSN; 07/29/2014 1:49 PM) Colonoscopy never Mammogram within last year Pap Smear 1-5 years ago  Allergies Erasmo Leventhal, RN, BSN; 07/29/2014 1:50 PM) Sulfabenzamide *CHEMICALS*  Medication History Erasmo Leventhal, RN, BSN; 07/29/2014 1:52 PM) Diclofenac Sodium (75MG  Tablet DR, Oral daily) Active.  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 No alcohol use No drug use Tobacco use Never smoker.  Family History (Erasmo Leventhal, RN, BSN; 07/29/2014 1:49 PM) Arthritis  Father. Depression Daughter. Diabetes Mellitus Family Members In General. Heart Disease Mother. Heart disease in female family member before age 81 Kidney Disease Mother. Respiratory Condition Father, Mother. Thyroid problems Father.  Pregnancy / Birth History (Erasmo Leventhal, RN, BSN; 07/29/2014 1:49 PM) Age at menarche 26 years. 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 Skin Not Present- Change in Wart/Mole, Dryness, Hives, Jaundice, New Lesions, Non-Healing Wounds, Rash and Ulcer. HEENT Present- Seasonal Allergies. Not Present- Earache, Hearing Loss, Hoarseness, Nose Bleed, Oral Ulcers, Ringing in the Ears, Sinus Pain, Sore Throat, Visual Disturbances, Wears glasses/contact lenses and Yellow Eyes. Respiratory Present- Chronic Cough. Not Present- Bloody sputum, Difficulty Breathing, Snoring and Wheezing. Breast Present- Breast Mass. Not Present- Breast Pain, Nipple Discharge and Skin Changes. Cardiovascular Not Present- Chest Pain, Difficulty Breathing Lying Down, Leg Cramps, Palpitations, Rapid Heart Rate, Shortness of Breath and Swelling of Extremities. Gastrointestinal Not Present- Abdominal Pain, Bloating, Bloody Stool, Change in Bowel Habits, Chronic diarrhea, Constipation, Difficulty Swallowing, Excessive gas, Gets full quickly at meals, Hemorrhoids, Indigestion, Nausea, Rectal Pain and Vomiting. Female Genitourinary Present- Frequency. Not Present- Nocturia, Painful Urination, Pelvic Pain and Urgency. Musculoskeletal Present- Joint Pain and Joint Stiffness. Not Present- Back Pain, Muscle Pain, Muscle Weakness and Swelling of Extremities. Neurological Present- Headaches. Not Present- Decreased Memory, Fainting, Numbness, Seizures, Tingling, Tremor, Trouble walking and Weakness. Psychiatric  Not Present- Anxiety, Bipolar, Change in Sleep Pattern, Depression, Fearful and Frequent crying. Endocrine Not Present- Cold Intolerance, Excessive Hunger, Hair Changes, Heat Intolerance, Hot flashes and New Diabetes. Hematology Not Present- Easy Bruising, Excessive bleeding, Gland problems,  HIV and Persistent Infections.   Vitals Occupational hygienist, BSN; 07/29/2014 1:50 PM) 07/29/2014 1:50 PM Weight: 161.2 lb Height: 61in Body Surface Area: 1.77 m Body Mass Index: 30.46 kg/m Temp.: 98.38F(Oral)  Pulse: 78 (Regular)  Resp.: 20 (Unlabored)  BP: 126/82 (Sitting, Left Arm, Standard)    Physical Exam Marland Kitchen T. Yiselle Babich MD; 07/29/2014 2:08 PM) The physical exam findings are as follows: Note:General: Alert, well-developed and well nourished Caucasian female, in no distress Skin: Warm and dry without rash or infection. HEENT: No palpable masses or thyromegaly. Sclera nonicteric. Pupils equal round and reactive. Breasts: No palpable masses or other abnormalities in either breast. No nipple discharge. No palpable axillary adenopathy. Lymph nodes: No cervical, supraclavicular, or inguinal nodes palpable. Lungs: Breath sounds clear and equal. No wheezing or increased work of breathing. Cardiovascular: Regular rate and rhythm without murmer. No JVD or edema. Peripheral pulses intact. No carotid bruits. Extremities: No edema or joint swelling or deformity. No chronic venous stasis changes. Neurologic: Alert and fully oriented. Gait normal. No focal weakness. Psychiatric: Normal mood and affect. Thought content appropriate with normal judgement and insight    Assessment & Plan Marland Kitchen T. 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 I discussed options of excision to confirm the diagnosis versus close clinical  follow-up. They would prefer to have this excised which is reasonable. I discussed proceeding with seed localized left breast lumpectomy as an outpatient under general anesthesia including the indications for the procedure, recovery and risks of bleeding, infection and anesthetic complications. All their questions were answered. Current Plans  Schedule for Surgery Radioactive seed localized left breast lumpectomy as an outpatient under general anesthesia   Signed by Edward Jolly, MD (07/29/2014 2:22 PM)

## 2014-08-12 NOTE — Anesthesia Postprocedure Evaluation (Signed)
  Anesthesia Post-op Note  Patient: Janice Higgins  Procedure(s) Performed: Procedure(s):  RADIOACTIVE SEED LOCALIZATION LEFT BREAST LUMPECTOMY (Left)  Patient Location: PACU  Anesthesia Type:General  Level of Consciousness: awake and alert   Airway and Oxygen Therapy: Patient Spontanous Breathing  Post-op Pain: mild  Post-op Assessment: Post-op Vital signs reviewed, Patient's Cardiovascular Status Stable and Respiratory Function Stable  Post-op Vital Signs: Reviewed  Filed Vitals:   08/12/14 1100  BP: 129/65  Pulse: 80  Temp:   Resp: 19    Complications: No apparent anesthesia complications

## 2014-08-12 NOTE — Transfer of Care (Signed)
Immediate Anesthesia Transfer of Care Note  Patient: Janice Higgins  Procedure(s) Performed: Procedure(s):  RADIOACTIVE SEED LOCALIZATION LEFT BREAST LUMPECTOMY (Left)  Patient Location: PACU  Anesthesia Type:General  Level of Consciousness: awake, alert , oriented and patient cooperative  Airway & Oxygen Therapy: Patient Spontanous Breathing and Patient connected to face mask oxygen  Post-op Assessment: Report given to RN and Post -op Vital signs reviewed and stable  Post vital signs: Reviewed and stable  Last Vitals:  Filed Vitals:   08/12/14 0828  BP: 130/75  Pulse: 71  Temp: 36.6 C  Resp: 20    Complications: No apparent anesthesia complications

## 2014-08-12 NOTE — Discharge Instructions (Signed)
[facility] °Office Phone Number [phone] ° °BREAST BIOPSY/ PARTIAL MASTECTOMY: POST OP INSTRUCTIONS ° °Always review your discharge instruction sheet given to you by the facility where your surgery was performed. ° °IF YOU HAVE DISABILITY OR FAMILY LEAVE FORMS, YOU MUST BRING THEM TO THE OFFICE FOR PROCESSING.  DO NOT GIVE THEM TO YOUR DOCTOR. ° °1. A prescription for pain medication may be given to you upon discharge.  Take your pain medication as prescribed, if needed.  If narcotic pain medicine is not needed, then you may take acetaminophen (Tylenol) or ibuprofen (Advil) as needed. °2. Take your usually prescribed medications unless otherwise directed °3. If you need a refill on your pain medication, please contact your pharmacy.  They will contact our office to request authorization.  Prescriptions will not be filled after 5pm or on week-ends. °4. You should eat very light the first 24 hours after surgery, such as soup, crackers, pudding, etc.  Resume your normal diet the day after surgery. °5. Most patients will experience some swelling and bruising in the breast.  Ice packs and a good support bra will help.  Swelling and bruising can take several days to resolve.  °6. It is common to experience some constipation if taking pain medication after surgery.  Increasing fluid intake and taking a stool softener will usually help or prevent this problem from occurring.  A mild laxative (Milk of Magnesia or Miralax) should be taken according to package directions if there are no bowel movements after 48 hours. °7. Unless discharge instructions indicate otherwise, you may remove your bandages 24-48 hours after surgery, and you may shower at that time.  You may have steri-strips (small skin tapes) in place directly over the incision.  These strips should be left on the skin for 7-10 days.  If your surgeon used skin glue on the incision, you may shower in 24 hours.  The glue will flake off over the  next 2-3 weeks.  Any sutures or staples will be removed at the office during your follow-up visit. °8. ACTIVITIES:  You may resume regular daily activities (gradually increasing) beginning the next day.  Wearing a good support bra or sports bra minimizes pain and swelling.  You may have sexual intercourse when it is comfortable. °a. You may drive when you no longer are taking prescription pain medication, you can comfortably wear a seatbelt, and you can safely maneuver your car and apply brakes. °b. RETURN TO WORK:  ______________________________________________________________________________________ °9. You should see your doctor in the office for a follow-up appointment approximately two weeks after your surgery.  Your doctor’s nurse will typically make your follow-up appointment when she calls you with your pathology report.  Expect your pathology report 2-3 business days after your surgery.  You may call to check if you do not hear from us after three days. °10. OTHER INSTRUCTIONS: _______________________________________________________________________________________________ _____________________________________________________________________________________________________________________________________ °_____________________________________________________________________________________________________________________________________ °_____________________________________________________________________________________________________________________________________ ° °WHEN TO CALL YOUR DOCTOR: °1. Fever over 101.0 °2. Nausea and/or vomiting. °3. Extreme swelling or bruising. °4. Continued bleeding from incision. °5. Increased pain, redness, or drainage from the incision. ° °The clinic staff is available to answer your questions during regular business hours.  Please don’t hesitate to call and ask to speak to one of the nurses for clinical concerns.  If you have a medical emergency, go to the nearest  emergency room or call 911.  A surgeon from [facility] is always on call at the hospital. ° °For further questions, please visit [email or web address]  ° ° °  Post Anesthesia Home Care Instructions ° °Activity: °Get plenty of rest for the remainder of the day. A responsible adult should stay with you for 24 hours following the procedure.  °For the next 24 hours, DO NOT: °-Drive a car °-Operate machinery °-Drink alcoholic beverages °-Take any medication unless instructed by your physician °-Make any legal decisions or sign important papers. ° °Meals: °Start with liquid foods such as gelatin or soup. Progress to regular foods as tolerated. Avoid greasy, spicy, heavy foods. If nausea and/or vomiting occur, drink only clear liquids until the nausea and/or vomiting subsides. Call your physician if vomiting continues. ° °Special Instructions/Symptoms: °Your throat may feel dry or sore from the anesthesia or the breathing tube placed in your throat during surgery. If this causes discomfort, gargle with warm salt water. The discomfort should disappear within 24 hours. ° °

## 2014-08-15 ENCOUNTER — Encounter (HOSPITAL_BASED_OUTPATIENT_CLINIC_OR_DEPARTMENT_OTHER): Payer: Self-pay | Admitting: General Surgery

## 2014-08-19 ENCOUNTER — Encounter (HOSPITAL_COMMUNITY): Payer: Self-pay

## 2014-08-19 ENCOUNTER — Encounter (HOSPITAL_COMMUNITY)
Admission: RE | Admit: 2014-08-19 | Discharge: 2014-08-19 | Disposition: A | Discharge status: Home / Self Care | Payer: 59 | Source: Ambulatory Visit | Attending: General Surgery | Admitting: General Surgery

## 2014-08-19 DIAGNOSIS — D0512 Intraductal carcinoma in situ of left breast: Secondary | ICD-10-CM | POA: Diagnosis present

## 2014-08-19 DIAGNOSIS — M1990 Unspecified osteoarthritis, unspecified site: Secondary | ICD-10-CM | POA: Diagnosis not present

## 2014-08-19 LAB — CBC
HEMATOCRIT: 42.9 % (ref 36.0–46.0)
Hemoglobin: 14.1 g/dL (ref 12.0–15.0)
MCH: 30.2 pg (ref 26.0–34.0)
MCHC: 32.9 g/dL (ref 30.0–36.0)
MCV: 91.9 fL (ref 78.0–100.0)
Platelets: 294 10*3/uL (ref 150–400)
RBC: 4.67 MIL/uL (ref 3.87–5.11)
RDW: 13.3 % (ref 11.5–15.5)
WBC: 7.8 10*3/uL (ref 4.0–10.5)

## 2014-08-19 LAB — BASIC METABOLIC PANEL
Anion gap: 7 (ref 5–15)
BUN: 9 mg/dL (ref 6–23)
CHLORIDE: 104 mmol/L (ref 96–112)
CO2: 27 mmol/L (ref 19–32)
Calcium: 10.2 mg/dL (ref 8.4–10.5)
Creatinine, Ser: 0.65 mg/dL (ref 0.50–1.10)
GFR calc Af Amer: >90 mL/min (ref >90)
GFR calc non Af Amer: >90 mL/min (ref >90)
Glucose, Bld: 98 mg/dL (ref 70–99)
POTASSIUM: 3.6 mmol/L (ref 3.5–5.1)
Sodium: 138 mmol/L (ref 135–145)

## 2014-08-19 NOTE — Progress Notes (Signed)
Call to [facility],requested that Dr. Excell Seltzer be notified that his PAT orders need to be signed, kept on hold, will call back later.

## 2014-08-19 NOTE — Progress Notes (Signed)
Call to [facility] at [facility], asked for Dr. Excell Seltzer to sign orders.

## 2014-08-19 NOTE — Pre-Procedure Instructions (Signed)
Janice Higgins  08/19/2014   Your procedure is scheduled on:  08/22/2014  Report to [facility] Admitting     ENTRANCE A at 11:00 AM.  Call this number if you have problems the morning of surgery: [phone]   Remember:   Do not eat food or drink liquids after midnight.  On Sunday   Take these medicines the morning of surgery with A SIP OF WATER: NONE   Do not wear jewelry, make-up or nail polish.   Do not wear lotions, powders, or perfumes. You may wear deodorant.   Do not shave 48 hours prior to surgery.    Do not bring valuables to the hospital.  [facility] is not responsible       for any belongings or valuables.               Contacts, dentures or bridgework may not be worn into surgery.   Leave suitcase in the car. After surgery it may be brought to your room.   For patients admitted to the hospital, discharge time is determined by your   treatment team.               Patients discharged the day of surgery will not be allowed to drive  home.  Name and phone number of your driver: with spouse  Special Instructions: Special Instructions: [facility] - Preparing for Surgery  Before surgery, you can play an important role.  Because skin is not sterile, your skin needs to be as free of germs as possible.  You can reduce the number of germs on you skin by washing with CHG (chlorahexidine gluconate) soap before surgery.  CHG is an antiseptic cleaner which kills germs and bonds with the skin to continue killing germs even after washing.  Please DO NOT use if you have an allergy to CHG or antibacterial soaps.  If your skin becomes reddened/irritated stop using the CHG and inform your nurse when you arrive at Short Stay.  Do not shave (including legs and underarms) for at least 48 hours prior to the first CHG shower.  You may shave your face.  Please follow these instructions carefully:   1.  Shower with CHG Soap the night before surgery and the  morning of Surgery.  2.   If you choose to wash your hair, wash your hair first as usual with your  normal shampoo.  3.  After you shampoo, rinse your hair and body thoroughly to remove the  Shampoo.  4.  Use CHG as you would any other liquid soap.  You can apply chg directly to the skin and wash gently with scrungie or a clean washcloth.  5.  Apply the CHG Soap to your body ONLY FROM THE NECK DOWN.    Do not use on open wounds or open sores.  Avoid contact with your eyes, ears, mouth and genitals (private parts).  Wash genitals (private parts)   with your normal soap.  6.  Wash thoroughly, paying special attention to the area where your surgery will be performed.  7.  Thoroughly rinse your body with warm water from the neck down.  8.  DO NOT shower/wash with your normal soap after using and rinsing off   the CHG Soap.  9.  Pat yourself dry with a clean towel.            10 .  Wear clean pajamas.  11.  Place clean sheets on your bed the night of your first shower and do not sleep with pets.  Day of Surgery  Do not apply any lotions/deodorants the morning of surgery.  Please wear clean clothes to the hospital/surgery center.   Please read over the following fact sheets that you were given: Pain Booklet, Coughing and Deep Breathing and Surgical Site Infection Prevention

## 2014-08-22 ENCOUNTER — Ambulatory Visit (HOSPITAL_COMMUNITY): Payer: 59 | Admitting: Anesthesiology

## 2014-08-22 ENCOUNTER — Ambulatory Visit (HOSPITAL_COMMUNITY)
Admission: RE | Admit: 2014-08-22 | Discharge: 2014-08-22 | Disposition: A | Discharge status: Home / Self Care | Payer: 59 | Source: Ambulatory Visit | Attending: General Surgery | Admitting: General Surgery

## 2014-08-22 ENCOUNTER — Encounter (HOSPITAL_COMMUNITY)
Admission: RE | Disposition: A | Discharge status: Home / Self Care | Payer: Self-pay | Source: Ambulatory Visit | Attending: General Surgery

## 2014-08-22 ENCOUNTER — Other Ambulatory Visit (INDEPENDENT_AMBULATORY_CARE_PROVIDER_SITE_OTHER): Payer: Self-pay | Admitting: General Surgery

## 2014-08-22 ENCOUNTER — Encounter (HOSPITAL_COMMUNITY): Payer: Self-pay | Admitting: Surgery

## 2014-08-22 DIAGNOSIS — D0512 Intraductal carcinoma in situ of left breast: Secondary | ICD-10-CM | POA: Insufficient documentation

## 2014-08-22 DIAGNOSIS — M1990 Unspecified osteoarthritis, unspecified site: Secondary | ICD-10-CM | POA: Insufficient documentation

## 2014-08-22 HISTORY — PX: RE-EXCISION OF BREAST LUMPECTOMY: SHX6048

## 2014-08-22 SURGERY — EXCISION, LESION, BREAST
Anesthesia: General | Site: Breast | Laterality: Left

## 2014-08-22 MED ORDER — CEFAZOLIN SODIUM-DEXTROSE 2-3 GM-% IV SOLR
INTRAVENOUS | Status: AC
  Filled 2014-08-22: qty 50

## 2014-08-22 MED ORDER — MIDAZOLAM HCL 5 MG/5ML IJ SOLN
INTRAMUSCULAR | Status: DC | PRN
  Administered 2014-08-22: 2 mg via INTRAVENOUS

## 2014-08-22 MED ORDER — OXYCODONE HCL 5 MG PO TABS
5.0000 mg | ORAL_TABLET | Freq: Once | ORAL | Status: AC | PRN
  Administered 2014-08-22: 5 mg via ORAL

## 2014-08-22 MED ORDER — HYDROCODONE-ACETAMINOPHEN 5-325 MG PO TABS: 1.0000 | ORAL_TABLET | ORAL | Status: DC | PRN

## 2014-08-22 MED ORDER — LACTATED RINGERS IV SOLN
INTRAVENOUS | Status: DC
  Administered 2014-08-22: 11:00:00 via INTRAVENOUS

## 2014-08-22 MED ORDER — 0.9 % SODIUM CHLORIDE (POUR BTL) OPTIME
TOPICAL | Status: DC | PRN
  Administered 2014-08-22: 1000 mL

## 2014-08-22 MED ORDER — HYDROMORPHONE HCL 1 MG/ML IJ SOLN
INTRAMUSCULAR | Status: AC
  Filled 2014-08-22: qty 1

## 2014-08-22 MED ORDER — HYDROMORPHONE HCL 1 MG/ML IJ SOLN
INTRAMUSCULAR | Status: AC
  Administered 2014-08-22: 0.5 mg via INTRAVENOUS
  Filled 2014-08-22: qty 1

## 2014-08-22 MED ORDER — PROPOFOL 10 MG/ML IV BOLUS
INTRAVENOUS | Status: AC
  Filled 2014-08-22: qty 20

## 2014-08-22 MED ORDER — GLYCOPYRROLATE 0.2 MG/ML IJ SOLN
INTRAMUSCULAR | Status: AC
  Filled 2014-08-22: qty 1

## 2014-08-22 MED ORDER — SODIUM BICARBONATE 4 % IV SOLN
INTRAVENOUS | Status: DC | PRN
  Administered 2014-08-22: 5 mL via INTRAVENOUS

## 2014-08-22 MED ORDER — GLYCOPYRROLATE 0.2 MG/ML IJ SOLN
INTRAMUSCULAR | Status: DC | PRN
  Administered 2014-08-22: 0.2 mg via INTRAVENOUS

## 2014-08-22 MED ORDER — LACTATED RINGERS IV SOLN
INTRAVENOUS | Status: DC | PRN
  Administered 2014-08-22: 13:00:00 via INTRAVENOUS

## 2014-08-22 MED ORDER — OXYCODONE HCL 5 MG/5ML PO SOLN: 5.0000 mg | Freq: Once | ORAL | Status: AC | PRN

## 2014-08-22 MED ORDER — PROPOFOL 10 MG/ML IV BOLUS
INTRAVENOUS | Status: DC | PRN
Start: 2014-08-22 — End: 2014-08-22
  Administered 2014-08-22: 120 mg via INTRAVENOUS

## 2014-08-22 MED ORDER — ONDANSETRON HCL 4 MG/2ML IJ SOLN
INTRAMUSCULAR | Status: DC | PRN
  Administered 2014-08-22: 4 mg via INTRAVENOUS

## 2014-08-22 MED ORDER — OXYCODONE HCL 5 MG PO TABS
ORAL_TABLET | ORAL | Status: AC
  Filled 2014-08-22: qty 1

## 2014-08-22 MED ORDER — LIDOCAINE HCL (CARDIAC) 20 MG/ML IV SOLN
INTRAVENOUS | Status: DC | PRN
  Administered 2014-08-22: 60 mg via INTRAVENOUS

## 2014-08-22 MED ORDER — DEXAMETHASONE SODIUM PHOSPHATE 4 MG/ML IJ SOLN
INTRAMUSCULAR | Status: DC | PRN
  Administered 2014-08-22: 4 mg via INTRAVENOUS

## 2014-08-22 MED ORDER — MIDAZOLAM HCL 2 MG/2ML IJ SOLN
INTRAMUSCULAR | Status: AC
  Filled 2014-08-22: qty 2

## 2014-08-22 MED ORDER — FENTANYL CITRATE 0.05 MG/ML IJ SOLN
INTRAMUSCULAR | Status: AC
  Filled 2014-08-22: qty 5

## 2014-08-22 MED ORDER — CEFAZOLIN SODIUM-DEXTROSE 2-3 GM-% IV SOLR
INTRAVENOUS | Status: DC | PRN
  Administered 2014-08-22: 2 g via INTRAVENOUS

## 2014-08-22 MED ORDER — FENTANYL CITRATE 0.05 MG/ML IJ SOLN
INTRAMUSCULAR | Status: DC | PRN
  Administered 2014-08-22 (×4): 25 ug via INTRAVENOUS
  Administered 2014-08-22 (×2): 50 ug via INTRAVENOUS

## 2014-08-22 MED ORDER — BUPIVACAINE-EPINEPHRINE 0.25% -1:200000 IJ SOLN
INTRAMUSCULAR | Status: DC | PRN
  Administered 2014-08-22: 30 mL

## 2014-08-22 MED ORDER — HYDROMORPHONE HCL 1 MG/ML IJ SOLN
0.2500 mg | INTRAMUSCULAR | Status: DC | PRN
  Administered 2014-08-22 (×3): 0.5 mg via INTRAVENOUS

## 2014-08-22 MED ORDER — LIDOCAINE HCL (PF) 1 % IJ SOLN
INTRAMUSCULAR | Status: DC | PRN
  Administered 2014-08-22: 30 mL

## 2014-08-22 MED ORDER — ONDANSETRON HCL 4 MG/2ML IJ SOLN: 4.0000 mg | Freq: Four times a day (QID) | INTRAMUSCULAR | Status: DC | PRN

## 2014-08-22 SURGICAL SUPPLY — 48 items
APPLIER CLIP 9.375 SM OPEN (CLIP) ×2
APR CLP SM 9.3 20 MLT OPN (CLIP) ×1
BINDER BREAST LRG (GAUZE/BANDAGES/DRESSINGS) ×1 IMPLANT
BINDER BREAST XLRG (GAUZE/BANDAGES/DRESSINGS) IMPLANT
BLADE SURG 10 STRL SS (BLADE) ×1 IMPLANT
BLADE SURG 15 STRL LF DISP TIS (BLADE) IMPLANT
BLADE SURG 15 STRL SS (BLADE) ×2
CANISTER SUCTION 2500CC (MISCELLANEOUS) IMPLANT
CHLORAPREP W/TINT 26ML (MISCELLANEOUS) ×2 IMPLANT
CLIP APPLIE 9.375 SM OPEN (CLIP) IMPLANT
CONT SPEC 4OZ CLIKSEAL STRL BL (MISCELLANEOUS) ×1 IMPLANT
COVER SURGICAL LIGHT HANDLE (MISCELLANEOUS) ×2 IMPLANT
DECANTER SPIKE VIAL GLASS SM (MISCELLANEOUS) ×2 IMPLANT
DRAPE CHEST BREAST 15X10 FENES (DRAPES) ×2 IMPLANT
DRAPE UTILITY XL STRL (DRAPES) ×3 IMPLANT
ELECT COATED BLADE 2.86 ST (ELECTRODE) ×2 IMPLANT
ELECT REM PT RETURN 9FT ADLT (ELECTROSURGICAL) ×2
ELECTRODE REM PT RTRN 9FT ADLT (ELECTROSURGICAL) ×1 IMPLANT
GAUZE SPONGE 4X4 16PLY XRAY LF (GAUZE/BANDAGES/DRESSINGS) ×2 IMPLANT
GLOVE BIO SURGEON STRL SZ7 (GLOVE) ×1 IMPLANT
GLOVE BIOGEL PI IND STRL 7.0 (GLOVE) IMPLANT
GLOVE BIOGEL PI IND STRL 8 (GLOVE) ×1 IMPLANT
GLOVE BIOGEL PI INDICATOR 7.0 (GLOVE) ×1
GLOVE BIOGEL PI INDICATOR 8 (GLOVE) ×1
GOWN STRL REUS W/ TWL LRG LVL3 (GOWN DISPOSABLE) ×1 IMPLANT
GOWN STRL REUS W/ TWL XL LVL3 (GOWN DISPOSABLE) ×1 IMPLANT
GOWN STRL REUS W/TWL LRG LVL3 (GOWN DISPOSABLE) ×2
GOWN STRL REUS W/TWL XL LVL3 (GOWN DISPOSABLE) ×2
KIT BASIN OR (CUSTOM PROCEDURE TRAY) ×2 IMPLANT
KIT ROOM TURNOVER OR (KITS) ×2 IMPLANT
LIQUID BAND (GAUZE/BANDAGES/DRESSINGS) ×2 IMPLANT
NDL HYPO 25GX1X1/2 BEV (NEEDLE) ×1 IMPLANT
NEEDLE HYPO 25GX1X1/2 BEV (NEEDLE) ×2 IMPLANT
NS IRRIG 1000ML POUR BTL (IV SOLUTION) ×2 IMPLANT
PACK SURGICAL SETUP 50X90 (CUSTOM PROCEDURE TRAY) ×2 IMPLANT
PAD ARMBOARD 7.5X6 YLW CONV (MISCELLANEOUS) ×2 IMPLANT
PENCIL BUTTON HOLSTER BLD 10FT (ELECTRODE) ×2 IMPLANT
SUT MON AB 4-0 PC3 18 (SUTURE) ×2 IMPLANT
SUT SILK 2 0 SH (SUTURE) IMPLANT
SUT VIC AB 3-0 SH 18 (SUTURE) ×1 IMPLANT
SUT VIC AB 3-0 SH 27 (SUTURE) ×2
SUT VIC AB 3-0 SH 27XBRD (SUTURE) ×1 IMPLANT
SYR BULB 3OZ (MISCELLANEOUS) ×2 IMPLANT
SYR CONTROL 10ML LL (SYRINGE) ×2 IMPLANT
TOWEL OR 17X24 6PK STRL BLUE (TOWEL DISPOSABLE) ×2 IMPLANT
TOWEL OR 17X26 10 PK STRL BLUE (TOWEL DISPOSABLE) ×1 IMPLANT
TUBE CONNECTING 12X1/4 (SUCTIONS) IMPLANT
YANKAUER SUCT BULB TIP NO VENT (SUCTIONS) IMPLANT

## 2014-08-22 NOTE — H&P (Signed)
Janice Higgins 08/22/2014   [facility] Patient #: [id number] DOB: January 03, 1956 Married / Language: English / Race: White Female  History of Present Illness Marland Kitchen T. Darrio Bade MD; 07/29/2014 2:08 PM) Patient words: New Left Breast.  The patient is a 59 year old female with a recent Dx of DCIS left breast. She recently presented for her routine screening mammogram. This showed an area of asymmetry in the left breast and further imaging was recommended. Diagnostic mammogram with spot compression views revealed a small oval mass laterally in the left breast at the 3 o'clock position. Ultrasound confirmed a slightly irregular oval hypoechoic mass measuring 5 x 5 x 4 mm at the 3:30 position in the left breast 4 cm from the nipple. A large core needle biopsy was recommended and performed. This  revealed ductal papillomatosis. Complete excision was recommended.  This was performed last week revealing DCIS, 71mm, within 1 mm of the inferior margin.  I have reccommended re excision of the inferior margin. She has experienced no breast symptoms specifically lump or nipple discharge or skin changes or pain. She has no personal history or family history of breast problems or breast cancer. She has not had any problems since the biopsy.   Other Problems Erasmo Leventhal, RN, BSN; 07/29/2014 1:49 PM) Arthritis Hemorrhoids Kidney Stone  Past Surgical History Erasmo Leventhal, RN, BSN; 07/29/2014 1:49 PM) Oral Surgery Tonsillectomy  Diagnostic Studies History (Erasmo Leventhal, RN, BSN; 07/29/2014 1:49 PM) Colonoscopy never Mammogram within last year Pap Smear 1-5 years ago  Allergies Erasmo Leventhal, RN, BSN; 07/29/2014 1:50 PM) Sulfabenzamide *CHEMICALS*  Medication History Erasmo Leventhal, RN, BSN; 07/29/2014 1:52 PM) Diclofenac Sodium (75MG  Tablet DR, Oral daily) Active.  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 No alcohol  use No drug use Tobacco use Never smoker.  Family History (Erasmo Leventhal, RN, BSN; 07/29/2014 1:49 PM) Arthritis Father. Depression Daughter. Diabetes Mellitus Family Members In General. Heart Disease Mother. Heart disease in female family member before age 64 Kidney Disease Mother. Respiratory Condition Father, Mother. Thyroid problems Father.  Pregnancy / Birth History (Erasmo Leventhal, RN, BSN; 07/29/2014 1:49 PM) Age at menarche 75 years. 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 Skin Not Present- Change in Wart/Mole, Dryness, Hives, Jaundice, New Lesions, Non-Healing Wounds, Rash and Ulcer. HEENT Present- Seasonal Allergies. Not Present- Earache, Hearing Loss, Hoarseness, Nose Bleed, Oral Ulcers, Ringing in the Ears, Sinus Pain, Sore Throat, Visual Disturbances, Wears glasses/contact lenses and Yellow Eyes. Respiratory Present- Chronic Cough. Not Present- Bloody sputum, Difficulty Breathing, Snoring and Wheezing. Breast Present- Breast Mass. Not Present- Breast Pain, Nipple Discharge and Skin Changes. Cardiovascular Not Present- Chest Pain, Difficulty Breathing Lying Down, Leg Cramps, Palpitations, Rapid Heart Rate, Shortness of Breath and Swelling of Extremities. Gastrointestinal Not Present- Abdominal Pain, Bloating, Bloody Stool, Change in Bowel Habits, Chronic diarrhea, Constipation, Difficulty Swallowing, Excessive gas, Gets full quickly at meals, Hemorrhoids, Indigestion, Nausea, Rectal Pain and Vomiting. Female Genitourinary Present- Frequency. Not Present- Nocturia, Painful Urination, Pelvic Pain and Urgency. Musculoskeletal Present- Joint Pain and Joint Stiffness. Not Present- Back Pain, Muscle Pain, Muscle Weakness and Swelling of Extremities. Neurological Present-  Headaches. Not Present- Decreased Memory, Fainting, Numbness, Seizures, Tingling, Tremor, Trouble walking and Weakness. Psychiatric Not Present- Anxiety, Bipolar, Change in Sleep Pattern, Depression, Fearful and Frequent crying.  Endocrine Not Present- Cold Intolerance, Excessive Hunger, Hair Changes, Heat Intolerance, Hot flashes and New Diabetes. Hematology Not Present- Easy Bruising, Excessive bleeding, Gland problems, HIV and Persistent Infections.   Vitals Occupational hygienist, BSN; 07/29/2014 1:50 PM) 07/29/2014 1:50 PM Weight: 161.2 lb Height: 61in Body Surface Area: 1.77 m Body Mass Index: 30.46 kg/m Temp.: 98.43F(Oral)  Pulse: 78 (Regular)  Resp.: 20 (Unlabored)  BP: 126/82 (Sitting, Left Arm, Standard)    Physical Exam Marland Kitchen T. Zhana Jeangilles MD; 07/29/2014 2:08 PM) The physical exam findings are as follows: Note:General: Alert, well-developed and well nourished Caucasian female, in no distress Skin: Warm and dry without rash or infection. HEENT: No palpable masses or thyromegaly. Sclera nonicteric. Pupils equal round and reactive. Breasts: No palpable masses or other abnormalities in either breast. No nipple discharge. No palpable axillary adenopathy. Lymph nodes: No cervical, supraclavicular, or inguinal nodes palpable. Lungs: Breath sounds clear and equal. No wheezing or increased work of breathing. Cardiovascular: Regular rate and rhythm without murmer. No JVD or edema. Peripheral pulses intact. No carotid bruits. Extremities: No edema or joint swelling or deformity. No chronic venous stasis changes. Neurologic: Alert and fully oriented. Gait normal. No focal weakness. Psychiatric: Normal mood and affect. Thought content appropriate with normal judgement and insight    Assessment & Plan Marland Kitchen T. 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

## 2014-08-22 NOTE — Anesthesia Procedure Notes (Signed)
Procedure Name: LMA Insertion Date/Time: 08/22/2014 1:15 PM Performed by: Gershon Mussel, Jamontae Thwaites Pre-anesthesia Checklist: Patient identified, Timeout performed, Emergency Drugs available, Suction available and Patient being monitored Patient Re-evaluated:Patient Re-evaluated prior to inductionOxygen Delivery Method: Circle system utilized Preoxygenation: Pre-oxygenation with 100% oxygen Intubation Type: IV induction Ventilation: Mask ventilation without difficulty LMA: LMA inserted LMA Size: 4.0 Placement Confirmation: positive ETCO2 and CO2 detector Tube secured with: Tape Dental Injury: Teeth and Oropharynx as per pre-operative assessment

## 2014-08-22 NOTE — Discharge Instructions (Signed)
[facility] Office Phone Number [phone]  BREAST BIOPSY/ PARTIAL MASTECTOMY: POST OP INSTRUCTIONS  Always review your discharge instruction sheet given to you by the facility where your surgery was performed.  IF YOU HAVE DISABILITY OR FAMILY LEAVE FORMS, YOU MUST BRING THEM TO THE OFFICE FOR PROCESSING.  DO NOT GIVE THEM TO YOUR DOCTOR.  1. A prescription for pain medication may be given to you upon discharge.  Take your pain medication as prescribed, if needed.  If narcotic pain medicine is not needed, then you may take acetaminophen (Tylenol) or ibuprofen (Advil) as needed. 2. Take your usually prescribed medications unless otherwise directed 3. If you need a refill on your pain medication, please contact your pharmacy.  They will contact our office to request authorization.  Prescriptions will not be filled after 5pm or on week-ends. 4. You should eat very light the first 24 hours after surgery, such as soup, crackers, pudding, etc.  Resume your normal diet the day after surgery. 5. Most patients will experience some swelling and bruising in the breast.  Ice packs and a good support bra will help.  Swelling and bruising can take several days to resolve.  6. It is common to experience some constipation if taking pain medication after surgery.  Increasing fluid intake and taking a stool softener will usually help or prevent this problem from occurring.  A mild laxative (Milk of Magnesia or Miralax) should be taken according to package directions if there are no bowel movements after 48 hours. 7. Unless discharge instructions indicate otherwise, you may remove your bandages 24-48 hours after surgery, and you may shower at that time.  You may have steri-strips (small skin tapes) in place directly over the incision.  These strips should be left on the skin for 7-10 days.  If your surgeon used skin glue on the incision, you may shower in 24 hours.  The glue will flake off over the  next 2-3 weeks.  Any sutures or staples will be removed at the office during your follow-up visit. 8. ACTIVITIES:  You may resume regular daily activities (gradually increasing) beginning the next day.  Wearing a good support bra or sports bra minimizes pain and swelling.  You may have sexual intercourse when it is comfortable. a. You may drive when you no longer are taking prescription pain medication, you can comfortably wear a seatbelt, and you can safely maneuver your car and apply brakes. b. RETURN TO WORK:  ______________________________________________________________________________________ 9. You should see your doctor in the office for a follow-up appointment approximately two weeks after your surgery.  Your doctors nurse will typically make your follow-up appointment when she calls you with your pathology report.  Expect your pathology report 2-3 business days after your surgery.  You may call to check if you do not hear from [place] after three days. 10. OTHER INSTRUCTIONS: _______________________________________________________________________________________________ _____________________________________________________________________________________________________________________________________ _____________________________________________________________________________________________________________________________________ _____________________________________________________________________________________________________________________________________  WHEN TO CALL YOUR DOCTOR: 1. Fever over 101.0 2. Nausea and/or vomiting. 3. Extreme swelling or bruising. 4. Continued bleeding from incision. 5. Increased pain, redness, or drainage from the incision.  The clinic staff is available to answer your questions during regular business hours.  Please dont hesitate to call and ask to speak to one of the nurses for clinical concerns.  If you have a medical emergency, go to the nearest  emergency room or call 911.  A surgeon from [facility] is always on call at the hospital.  For further questions, please visit [email or web address] °Office Phone Number [phone] ° °BREAST BIOPSY/ PARTIAL MASTECTOMY: POST OP INSTRUCTIONS ° °Always review your discharge instruction sheet given to you by the facility where your surgery was performed. ° °IF YOU HAVE DISABILITY OR FAMILY LEAVE FORMS, YOU MUST BRING THEM TO THE OFFICE FOR PROCESSING.  DO NOT GIVE THEM TO YOUR DOCTOR. ° °11. A prescription for pain medication may be given to you upon discharge.  Take your pain medication as prescribed, if needed.  If narcotic pain medicine is not needed, then you may take acetaminophen (Tylenol) or ibuprofen (Advil) as needed. °12. Take your usually prescribed medications unless otherwise directed °13. If you need a refill on your pain medication, please contact your pharmacy.  They will contact our office to request authorization.  Prescriptions will not be filled after 5pm or on week-ends. °14. You should eat very light the first 24 hours after surgery, such as soup, crackers, pudding, etc.  Resume your normal diet the day after surgery. °15. Most patients will experience some swelling and bruising in the breast.  Ice packs and a good support bra will help.  Swelling and bruising can take several days to resolve.  °16. It is common to experience some constipation if taking pain medication after surgery.  Increasing fluid intake and taking a stool softener will usually help or prevent this problem from occurring.  A mild laxative (Milk of Magnesia or Miralax) should be taken according to package directions if there are no bowel movements after 48 hours. °17. Unless discharge instructions indicate otherwise, you may remove your bandages 24-48 hours after surgery, and you may shower at that time.  You may have steri-strips (small skin tapes) in place directly over the  incision.  These strips should be left on the skin for 7-10 days.  If your surgeon used skin glue on the incision, you may shower in 24 hours.  The glue will flake off over the next 2-3 weeks.  Any sutures or staples will be removed at the office during your follow-up visit. °18. ACTIVITIES:  You may resume regular daily activities (gradually increasing) beginning the next day.  Wearing a good support bra or sports bra minimizes pain and swelling.  You may have sexual intercourse when it is comfortable. °a. You may drive when you no longer are taking prescription pain medication, you can comfortably wear a seatbelt, and you can safely maneuver your car and apply brakes. °b. RETURN TO WORK:  ______________________________________________________________________________________ °19. You should see your doctor in the office for a follow-up appointment approximately two weeks after your surgery.  Your doctor’s nurse will typically make your follow-up appointment when she calls you with your pathology report.  Expect your pathology report 2-3 business days after your surgery.  You may call to check if you do not hear from us after three days. °20. OTHER INSTRUCTIONS: _______________________________________________________________________________________________ _____________________________________________________________________________________________________________________________________ °_____________________________________________________________________________________________________________________________________ °_____________________________________________________________________________________________________________________________________ ° °WHEN TO CALL YOUR DOCTOR: °6. Fever over 101.0 °7. Nausea and/or vomiting. °8. Extreme swelling or bruising. °9. Continued bleeding from incision. °10. Increased pain, redness, or drainage from the incision. ° °The clinic staff is available to answer your questions  during regular business hours.  Please don’t hesitate to call and ask to speak to one of the nurses for clinical concerns.  If you have a medical emergency, go to the nearest emergency room or call 911.  A surgeon from [facility] is always on call at the hospital. ° °For further questions, please visit [email or web address]  °

## 2014-08-22 NOTE — Transfer of Care (Signed)
Immediate Anesthesia Transfer of Care Note  Patient: Janice Higgins  Procedure(s) Performed: Procedure(s): RE-EXCISION OF LEFT BREAST LUMPECTOMY (Left)  Patient Location: PACU  Anesthesia Type:General  Level of Consciousness: awake, oriented, patient cooperative and responds to stimulation  Airway & Oxygen Therapy: Patient Spontanous Breathing and Patient connected to nasal cannula oxygen  Post-op Assessment: Report given to RN, Post -op Vital signs reviewed and stable and Patient moving all extremities  Post vital signs: Reviewed and stable  Last Vitals:  Filed Vitals:   08/22/14 1402  BP: 115/62  Pulse: 83  Temp: 36.4 C  Resp: 8    Complications: No apparent anesthesia complications

## 2014-08-22 NOTE — Interval H&P Note (Signed)
History and Physical Interval Note:  08/22/2014 12:30 PM  Janice Higgins  has presented today for surgery, with the diagnosis of DCIS left breast  The various methods of treatment have been discussed with the patient and family. After consideration of risks, benefits and other options for treatment, the patient has consented to  Procedure(s): RE-EXCISION OF LEFT BREAST LUMPECTOMY (Left) as a surgical intervention .  The patient's history has been reviewed, patient examined, no change in status, stable for surgery.  I have reviewed the patient's chart and labs.  Questions were answered to the patient's satisfaction.     Mccormick Macon T

## 2014-08-22 NOTE — Op Note (Signed)
Preoperative Diagnosis: DCIS left breast  Postoprative Diagnosis: DCIS left breast  Procedure: Procedure(s): RE-EXCISION OF LEFT BREAST LUMPECTOMY   Surgeon: Excell Seltzer T   Assistants: None  Anesthesia:  General LMA anesthesia  Indications: Patient recently underwent large core needle biopsy for an area of distortion in the lateral left breast. This revealed ductal papillomatosis and excision was recommended to confirm the diagnosis and rule out underlying malignancy. 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 The inferior margin being focally less than 1 mm. I have recommended reexcision of the inferior margin of her lumpectomy to obtain wider margins. This was discussed with the patient including the indications for the procedure and its nature as well as risks of bleeding and infection and anesthetic complications. She is in agreement.    Procedure Detail:  Patient was brought to the operating room, placed in the supine position on the operating table, and laryngeal mask general anesthesia induced. The left breast was widely sterilely prepped and draped. She received preoperative IV antibiotics. Patient timeout was performed and correct procedure verified. The previous lumpectomy incision in the lateral left breast was sharply opened dissection carried down through the subcutaneous tissue and all previous suture material removed in the lumpectomy cavity widely opened and exposed. I then completely re-excise the entire inferior margin back approximately 5-6 mm. This was oriented and sent as a permanent specimen. Hemostasis was obtained with cautery and a figure-of-eight suture of 3-0 Vicryl. The wound was irrigated. The lumpectomy cavity was marked with clips. The breast and subcutaneous tissue was closed with interrupted 3-0 Vicryl and the skin with subcutaneous 4-0 Monocryl and Dermabond.  Sponge needle and instrument counts were correct.    Findings: As above  Estimated Blood Loss:  Minimal         Drains: nnone  Blood Given: none          Specimens: Reexcision inferior margin of left breast lumpectomy, oriented with suture        Complications:  * No complications entered in OR log *         Disposition: PACU - hemodynamically stable.         Condition: stable

## 2014-08-22 NOTE — Anesthesia Postprocedure Evaluation (Signed)
  Anesthesia Post-op Note  Patient: Janice Higgins  Procedure(s) Performed: Procedure(s): RE-EXCISION OF LEFT BREAST LUMPECTOMY (Left)  Patient Location: PACU  Anesthesia Type:General  Level of Consciousness: awake, alert , patient cooperative and responds to stimulation  Airway and Oxygen Therapy: Patient Spontanous Breathing and Patient connected to nasal cannula oxygen  Post-op Pain: none  Post-op Assessment: Post-op Vital signs reviewed, Patient's Cardiovascular Status Stable, Respiratory Function Stable, Patent Airway, No signs of Nausea or vomiting and Pain level controlled  Post-op Vital Signs: Reviewed and stable  Last Vitals:  Filed Vitals:   08/22/14 1402  BP: 115/62  Pulse: 83  Temp: 36.4 C  Resp: 8    Complications: No apparent anesthesia complications

## 2014-08-22 NOTE — Interval H&P Note (Signed)
History and Physical Interval Note:  08/22/2014 12:30 PM  Janice Higgins  has presented today for surgery, with the diagnosis of DCIS left breast  The various methods of treatment have been discussed with the patient and family. After consideration of risks, benefits and other options for treatment, the patient has consented to  Procedure(s): RE-EXCISION OF LEFT BREAST LUMPECTOMY (Left) as a surgical intervention .  The patient's history has been reviewed, patient examined, no change in status, stable for surgery.  I have reviewed the patient's chart and labs.  Questions were answered to the patient's satisfaction.     Malcolm Hetz T

## 2014-08-22 NOTE — Anesthesia Preprocedure Evaluation (Signed)
Anesthesia Evaluation  Patient identified by MRN, date of birth, ID band Patient awake    Reviewed: Allergy & Precautions, NPO status , Patient's Chart, lab work & pertinent test results  Airway Mallampati: II   Neck ROM: full    Dental   Pulmonary neg pulmonary ROS,          Cardiovascular negative cardio ROS      Neuro/Psych  Headaches,    GI/Hepatic   Endo/Other    Renal/GU      Musculoskeletal  (+) Arthritis -,   Abdominal   Peds  Hematology   Anesthesia Other Findings   Reproductive/Obstetrics Breast CA                             Anesthesia Physical Anesthesia Plan  ASA: II  Anesthesia Plan: General   Post-op Pain Management:    Induction: Intravenous  Airway Management Planned: LMA  Additional Equipment:   Intra-op Plan:   Post-operative Plan:   Informed Consent: I have reviewed the patients History and Physical, chart, labs and discussed the procedure including the risks, benefits and alternatives for the proposed anesthesia with the patient or authorized representative who has indicated his/her understanding and acceptance.     Plan Discussed with: CRNA, Anesthesiologist and Surgeon  Anesthesia Plan Comments:         Anesthesia Quick Evaluation

## 2014-08-23 ENCOUNTER — Encounter (HOSPITAL_COMMUNITY): Payer: Self-pay | Admitting: General Surgery

## 2014-08-25 ENCOUNTER — Other Ambulatory Visit (INDEPENDENT_AMBULATORY_CARE_PROVIDER_SITE_OTHER): Payer: Self-pay | Admitting: General Surgery

## 2014-08-29 ENCOUNTER — Ambulatory Visit (HOSPITAL_COMMUNITY): Payer: 59 | Admitting: Oncology

## 2014-08-29 ENCOUNTER — Encounter (HOSPITAL_COMMUNITY): Payer: Self-pay | Admitting: Hematology & Oncology

## 2014-08-29 ENCOUNTER — Encounter (HOSPITAL_COMMUNITY): Payer: 59 | Attending: Hematology & Oncology | Admitting: Hematology & Oncology

## 2014-08-29 VITALS — BP 142/84 | HR 81 | Temp 98.1°F | Resp 16 | Ht 61.5 in | Wt 154.9 lb

## 2014-08-29 DIAGNOSIS — D0512 Intraductal carcinoma in situ of left breast: Secondary | ICD-10-CM

## 2014-08-29 DIAGNOSIS — Z170 Estrogen receptor positive status [ER+]: Secondary | ICD-10-CM

## 2014-08-29 NOTE — Patient Instructions (Signed)
..  [place] at [facility] Discharge Instructions  RECOMMENDATIONS MADE BY THE CONSULTANT AND ANY TEST RESULTS WILL BE SENT TO YOUR REFERRING PHYSICIAN.  We will have Angie check on coverage at [facility] for radiation We will see you back in one month   Thank you for choosing [place] at [facility] to provide your oncology and hematology care.  To afford each patient quality time with our provider, please arrive at least 15 minutes before your scheduled appointment time.    You need to re-schedule your appointment should you arrive 10 or more minutes late.  We strive to give you quality time with our providers, and arriving late affects you and other patients whose appointments are after yours.  Also, if you no show three or more times for appointments you may be dismissed from the clinic at the providers discretion.     Again, thank you for choosing [facility].  Our hope is that these requests will decrease the amount of time that you wait before being seen by our physicians.       _____________________________________________________________  Should you have questions after your visit to [facility], please contact our office at [phone] between the hours of 8:30 a.m. and 4:30 p.m.  Voicemails left after 4:30 p.m. will not be returned until the following business day.  For prescription refill requests, have your pharmacy contact our office.

## 2014-08-30 ENCOUNTER — Encounter (HOSPITAL_BASED_OUTPATIENT_CLINIC_OR_DEPARTMENT_OTHER): Payer: Self-pay | Admitting: *Deleted

## 2014-08-30 DIAGNOSIS — C50912 Malignant neoplasm of unspecified site of left female breast: Secondary | ICD-10-CM | POA: Insufficient documentation

## 2014-08-30 NOTE — Progress Notes (Signed)
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 female is here because of newly diagnosed DCIS of the left breast. It is just undergone reexcision with an inferior margin being less than 1 mm. he is scheduled for reexcision on the 22nd. She has a lot of questions in regards to whether this is "normal". She has been a bit surprised about her cancer diagnosis. She states however she is doing fairly well. She states her breast is sore at her surgical site.   He underwent a routine screening mammogram in November of last year were asymmetry in the left breast was noted. Ultrasound performed on 06/21/2014 showed a suspicious left breast mass. She underwent a core needle biopsy on 06/28/2014 that showed a ductal papilloma. She underwent definitive excision which showed an intermediate grade DCIS that was ER positive, and PR positive. She has undergone reexcision on 08/22/2014 with a close margin. Reexcision is planned for February 22.  He gets routine mammograms every year. But reports she has never had a screening colonoscopy.    MEDICAL HISTORY:  Past Medical History  Diagnosis Date  . Osteoarthritis     hands and feet  . History of kidney stones   . 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 History of bronchitis     SURGICAL HISTORY: Past Surgical History  Procedure Laterality Date  . Foot surgery Right 1995    exc. bone spur  . Tonsillectomy    . Tubal ligation    . Lasik    . Breast lumpectomy with radioactive seed localization Left 08/12/2014    Procedure:  RADIOACTIVE SEED LOCALIZATION LEFT BREAST LUMPECTOMY;  Surgeon: Excell Seltzer, MD;  Location: [facility];  Service: General;  Laterality: Left;  . Re-excision of breast lumpectomy Left 08/22/2014    Procedure: RE-EXCISION OF LEFT BREAST LUMPECTOMY;  Surgeon: Excell Seltzer, MD;  Location[facility];  Service: General;  Laterality: Left;    SOCIAL HISTORY: History   Social History  . Marital Status: Married    Spouse Name: N/A  . Number of Children: N/A  . Years of Education: N/A   Occupational History  . Not on file.   Social History Main Topics  . Smoking status: Never Smoker   . Smokeless tobacco: Never Used  . Alcohol Use: No  . Drug Use: No  . Sexual Activity: Yes    Birth Control/ Protection: Post-menopausal   Other Topics Concern  . Not on file   Social History Narrative   she is married for 36 years. She was born and [place]. She has worked at [employer] for greater than 30 years.  FAMILY HISTORY: Family History  Problem Relation Age of Onset  . Hypertension Father   . Hypertension Mother   . Heart disease Mother 64   indicated that  her mother is deceased. She indicated that her father is alive.   Her mother died 58 years ago at the age of 31 from an MI. She had asthma. Her father is alive at 39 with a new diagnosis of hypertension and thyroid problems. He is very independent. She had one brother that died at the age of 18 months from "water head". She has a sister who she states has psychiatric illness. Her nephew had the same disease as her brother and died at the age of 78.  ALLERGIES:  is allergic to sulfa antibiotics.  MEDICATIONS:  Current Outpatient  Prescriptions  Medication Sig Dispense Refill  . albuterol (PROVENTIL HFA;VENTOLIN HFA) 108 (90 BASE) MCG/ACT inhaler Inhale 2 puffs into the lungs every 4 (four) hours as needed for wheezing. 1 Inhaler 5  . HYDROcodone-acetaminophen (NORCO/VICODIN) 5-325 MG per tablet Take 1-2 tablets by mouth every 4 (four) hours as needed for moderate pain or severe pain. 30 tablet 0  . diclofenac (VOLTAREN) 75 MG EC tablet TAKE 1 TABLET BY MOUTH TWICE DAILY WITH MEALS 60 tablet 5   No current facility-administered medications for this visit.    Review of Systems  Constitutional: Negative.   Eyes: Negative.   Respiratory: Negative.   Cardiovascular: Negative.   Gastrointestinal: Positive for constipation.       Constipation is chronic  "all her life" takes per-colace occasionally. Never had a C-scope  Genitourinary: Positive for urgency.       Nocturia   Musculoskeletal: Positive for joint pain.       Plantar fascitis   Skin: Negative.   Neurological: Positive for headaches. Negative for dizziness, tingling, tremors, sensory change, speech change, focal weakness, seizures and loss of consciousness.  Endo/Heme/Allergies: Negative.   Psychiatric/Behavioral: Negative.     PHYSICAL EXAMINATION: ECOG PERFORMANCE STATUS: 0 - Asymptomatic  Filed Vitals:   08/29/14 1039  BP: 142/84  Pulse: 81  Temp: 98.1 F (36.7 C)  Resp: 16   Filed Weights   08/29/14 1039  Weight: 154 lb 14.4 oz (70.262 kg)     Physical Exam  Constitutional: She is oriented to person, place, and time and well-developed, well-nourished, and in no distress.  HENT:  Head: Normocephalic and atraumatic.  Nose: Nose normal.  Mouth/Throat: Oropharynx is clear and moist. No oropharyngeal exudate.  Eyes: Conjunctivae and EOM are normal. Pupils are equal, round, and reactive to light. Right eye exhibits no discharge. Left eye exhibits no discharge. No scleral icterus.  Neck: Normal range of motion. Neck supple. No tracheal  deviation present. No thyromegaly present.  Cardiovascular: Normal rate, regular rhythm and normal heart sounds.  Exam reveals no gallop and no friction rub.   No murmur heard. Pulmonary/Chest: Effort normal and breath sounds normal. She has no wheezes. She has no rales.  Left Breast surgical incision site is C/D/I .  No erythema or warmth   Abdominal: Soft. Bowel sounds are normal. She exhibits no distension and no mass. There is no tenderness. There is no rebound and no guarding.  Musculoskeletal: Normal range of motion. She exhibits no edema.  Lymphadenopathy:    She has no cervical adenopathy.  Neurological: She is alert and oriented to person, place, and time. She has normal reflexes. No cranial nerve deficit. Gait normal. Coordination normal.  Skin: Skin is warm and dry. No rash noted.  Psychiatric: Mood, memory, affect and judgment normal.  Nursing note and vitals reviewed.    LABORATORY DATA:  I have reviewed the data as listed  Lab Results  Component Value Date   WBC 7.8 08/19/2014   HGB 14.1 08/19/2014   HCT 42.9 08/19/2014   MCV 91.9 08/19/2014   PLT 294 08/19/2014     Chemistry      Component Value Date/Time   NA 138 08/19/2014 1615   K 3.6 08/19/2014 1615   CL 104 08/19/2014 1615   CO2 27 08/19/2014 1615   BUN 9 08/19/2014 1615   CREATININE 0.65 08/19/2014 1615   CREATININE 0.58 12/13/2013 0801      Component Value Date/Time   CALCIUM 10.2 08/19/2014 1615   ALKPHOS 45 12/13/2013 0801   AST 25 12/13/2013 0801   ALT 33 12/13/2013 0801   BILITOT 0.6 12/13/2013 0801       RADIOGRAPHIC STUDIES: I have personally reviewed the radiological images as listed and agreed with the findings in the report.  ASSESSMENT & PLAN:  Ductal carcinoma in situ (DCIS) of left breast This is a pleasant 59 year old female who was diagnosed with a ductal papilloma. An abnormality in the left breast was noted on routine screening mammography. I discussed with the patient that  approximately 15% of these lesions can be associated with malignancy. Therefore the findings at the time of her excision were not completely surprising. She is concerned that if she undergoes another excision she may have to have an additional surgery. I advised her that although that may be a possibility, I think looking at her pathology that is going to be unlikely. She is concerned that if they continue to find DCIS what her options may be. I advised her that Dr. Excell Seltzer will counsel her as to the best surgical option. She asked if mastectomy could potentially be an option and I advised her that will be a conversation she and Dr. Excell Seltzer can have after her next re-excision. If her reexcision margins are good then she has completed her surgical therapy. We discussed the role of radiation after lumpectomy in women with DCIS. She had questions regarding the possibility of getting radiation in [place]. She has a lot of concerns about missing work.  I provided her with reading information about DCIS. I reviewed some of this with her in detail in the office today. We talked about the role of endocrine therapy. I advised her we would discuss this when she returns after her reexcision. At this point she is going to move forward with reexcision. I will plan on seeing her back in 3-4 weeks. If she has interim questions or concerns I have advised her to please call the office.    No orders of the defined types were placed in this encounter.    All questions were answered. The patient knows to call the clinic with any problems, questions or concerns.   This note was electronically signed.    Molli Hazard, MD MD 08/31/2014 7:40 AM

## 2014-08-31 NOTE — Assessment & Plan Note (Signed)
This is a pleasant 59 year old female who was diagnosed with a ductal papilloma. An abnormality in the left breast was noted on routine screening mammography. I discussed with the patient that approximately 15% of these lesions can be associated with malignancy. Therefore the findings at the time of her excision were not completely surprising. She is concerned that if she undergoes another excision she may have to have an additional surgery. I advised her that although that may be a possibility, I think looking at her pathology that is going to be unlikely. She is concerned that if they continue to find DCIS what her options may be. I advised her that Dr. Excell Seltzer will counsel her as to the best surgical option. She asked if mastectomy could potentially be an option and I advised her that will be a conversation she and Dr. Excell Seltzer can have after her next re-excision. If her reexcision margins are good then she has completed her surgical therapy. We discussed the role of radiation after lumpectomy in women with DCIS. She had questions regarding the possibility of getting radiation in [place]. She has a lot of concerns about missing work.  I provided her with reading information about DCIS. I reviewed some of this with her in detail in the office today. We talked about the role of endocrine therapy. I advised her we would discuss this when she returns after her reexcision. At this point she is going to move forward with reexcision. I will plan on seeing her back in 3-4 weeks. If she has interim questions or concerns I have advised her to please call the office.

## 2014-09-02 ENCOUNTER — Ambulatory Visit (INDEPENDENT_AMBULATORY_CARE_PROVIDER_SITE_OTHER): Payer: 59 | Admitting: Family Medicine

## 2014-09-02 VITALS — BP 118/78 | Temp 98.4°F | Ht 61.5 in | Wt 152.0 lb

## 2014-09-02 DIAGNOSIS — B9689 Other specified bacterial agents as the cause of diseases classified elsewhere: Secondary | ICD-10-CM

## 2014-09-02 DIAGNOSIS — J019 Acute sinusitis, unspecified: Secondary | ICD-10-CM

## 2014-09-02 MED ORDER — AZITHROMYCIN 250 MG PO TABS: ORAL_TABLET | ORAL | Status: DC

## 2014-09-02 NOTE — Progress Notes (Signed)
   Subjective:    Patient ID: Janice Higgins, female    DOB: Dec 16, 1955, 59 y.o.   MRN: [id number]  HPI Patient arrives with slight scratchy throat and head feels full since yest am. 2 family members have a cold. Patient needs antibiotic because she is having surgery Monday and has to be covered.  She denies wheezing shortness of breath sweats chills nausea vomiting. She does have a history of reactive airway Review of Systems     Objective:   Physical Exam Sinuses non-tender nasal drainage noted throat is normal neck is supple lungs are crystal clear story rate normal       Assessment & Plan:  Viral URI Possible early sinusitis Patient very concerned that this will get worse over the next 3 days and prevent her having surgery. She was prescribed Zithromax 250 mg tablets she is to take 2 tablets today Saturday and Sunday. His long as her lungs stay clear and she has no wheezing I believe she can safely do her surgery.

## 2014-09-05 ENCOUNTER — Ambulatory Visit (HOSPITAL_BASED_OUTPATIENT_CLINIC_OR_DEPARTMENT_OTHER): Payer: 59 | Admitting: Anesthesiology

## 2014-09-05 ENCOUNTER — Encounter (HOSPITAL_BASED_OUTPATIENT_CLINIC_OR_DEPARTMENT_OTHER)
Admission: RE | Disposition: A | Discharge status: Home / Self Care | Payer: Self-pay | Source: Ambulatory Visit | Attending: General Surgery

## 2014-09-05 ENCOUNTER — Ambulatory Visit (HOSPITAL_BASED_OUTPATIENT_CLINIC_OR_DEPARTMENT_OTHER)
Admission: RE | Admit: 2014-09-05 | Discharge: 2014-09-05 | Disposition: A | Discharge status: Home / Self Care | Payer: 59 | Source: Ambulatory Visit | Attending: General Surgery | Admitting: General Surgery

## 2014-09-05 ENCOUNTER — Encounter (HOSPITAL_BASED_OUTPATIENT_CLINIC_OR_DEPARTMENT_OTHER): Payer: Self-pay | Admitting: *Deleted

## 2014-09-05 DIAGNOSIS — M1990 Unspecified osteoarthritis, unspecified site: Secondary | ICD-10-CM | POA: Insufficient documentation

## 2014-09-05 DIAGNOSIS — N641 Fat necrosis of breast: Secondary | ICD-10-CM | POA: Insufficient documentation

## 2014-09-05 DIAGNOSIS — K649 Unspecified hemorrhoids: Secondary | ICD-10-CM | POA: Insufficient documentation

## 2014-09-05 DIAGNOSIS — Z882 Allergy status to sulfonamides status: Secondary | ICD-10-CM | POA: Diagnosis not present

## 2014-09-05 DIAGNOSIS — D0512 Intraductal carcinoma in situ of left breast: Secondary | ICD-10-CM | POA: Diagnosis present

## 2014-09-05 DIAGNOSIS — Z87442 Personal history of urinary calculi: Secondary | ICD-10-CM | POA: Insufficient documentation

## 2014-09-05 HISTORY — PX: RE-EXCISION OF BREAST LUMPECTOMY: SHX6048

## 2014-09-05 HISTORY — DX: Unspecified osteoarthritis, unspecified site: M19.90

## 2014-09-05 HISTORY — DX: Personal history of other diseases of the respiratory system: Z87.09

## 2014-09-05 HISTORY — DX: Personal history of urinary calculi: Z87.442

## 2014-09-05 HISTORY — DX: Intraductal carcinoma in situ of left breast: D05.12

## 2014-09-05 LAB — POCT HEMOGLOBIN-HEMACUE: HEMOGLOBIN: 12.4 g/dL (ref 12.0–15.0)

## 2014-09-05 SURGERY — EXCISION, LESION, BREAST
Anesthesia: General | Site: Breast | Laterality: Left

## 2014-09-05 MED ORDER — HYDROMORPHONE HCL 1 MG/ML IJ SOLN
0.2500 mg | INTRAMUSCULAR | Status: DC | PRN
  Administered 2014-09-05 (×4): 0.5 mg via INTRAVENOUS

## 2014-09-05 MED ORDER — CHLORHEXIDINE GLUCONATE 4 % EX LIQD: 1.0000 "application " | Freq: Once | CUTANEOUS | Status: DC

## 2014-09-05 MED ORDER — LIDOCAINE HCL (CARDIAC) 20 MG/ML IV SOLN
INTRAVENOUS | Status: DC | PRN
  Administered 2014-09-05: 60 mg via INTRAVENOUS

## 2014-09-05 MED ORDER — BUPIVACAINE-EPINEPHRINE 0.5% -1:200000 IJ SOLN
INTRAMUSCULAR | Status: DC | PRN
  Administered 2014-09-05: 18 mL

## 2014-09-05 MED ORDER — MIDAZOLAM HCL 2 MG/ML PO SYRP: 12.0000 mg | ORAL_SOLUTION | Freq: Once | ORAL | Status: DC | PRN

## 2014-09-05 MED ORDER — PROMETHAZINE HCL 25 MG/ML IJ SOLN: 6.2500 mg | INTRAMUSCULAR | Status: DC | PRN

## 2014-09-05 MED ORDER — OXYCODONE HCL 5 MG PO TABS
ORAL_TABLET | ORAL | Status: AC
  Filled 2014-09-05: qty 1

## 2014-09-05 MED ORDER — DEXAMETHASONE SODIUM PHOSPHATE 4 MG/ML IJ SOLN
INTRAMUSCULAR | Status: DC | PRN
Start: 2014-09-05 — End: 2014-09-05
  Administered 2014-09-05: 10 mg via INTRAVENOUS

## 2014-09-05 MED ORDER — MIDAZOLAM HCL 2 MG/2ML IJ SOLN
1.0000 mg | INTRAMUSCULAR | Status: DC | PRN
Start: 2014-09-05 — End: 2014-09-05

## 2014-09-05 MED ORDER — BUPIVACAINE-EPINEPHRINE (PF) 0.5% -1:200000 IJ SOLN
INTRAMUSCULAR | Status: AC
  Filled 2014-09-05: qty 30

## 2014-09-05 MED ORDER — CEFAZOLIN SODIUM-DEXTROSE 2-3 GM-% IV SOLR
2.0000 g | INTRAVENOUS | Status: AC
  Administered 2014-09-05: 2 g via INTRAVENOUS

## 2014-09-05 MED ORDER — FENTANYL CITRATE 0.05 MG/ML IJ SOLN
INTRAMUSCULAR | Status: DC | PRN
  Administered 2014-09-05 (×2): 50 ug via INTRAVENOUS
  Administered 2014-09-05: 100 ug via INTRAVENOUS

## 2014-09-05 MED ORDER — OXYCODONE HCL 5 MG PO TABS
5.0000 mg | ORAL_TABLET | Freq: Once | ORAL | Status: AC | PRN
  Administered 2014-09-05: 5 mg via ORAL

## 2014-09-05 MED ORDER — MIDAZOLAM HCL 2 MG/2ML IJ SOLN
INTRAMUSCULAR | Status: AC
  Filled 2014-09-05: qty 2

## 2014-09-05 MED ORDER — HYDROMORPHONE HCL 1 MG/ML IJ SOLN
INTRAMUSCULAR | Status: AC
  Filled 2014-09-05: qty 1

## 2014-09-05 MED ORDER — PROPOFOL 10 MG/ML IV BOLUS
INTRAVENOUS | Status: AC
  Filled 2014-09-05: qty 20

## 2014-09-05 MED ORDER — PROPOFOL 10 MG/ML IV EMUL
INTRAVENOUS | Status: AC
Start: 2014-09-05 — End: 2014-09-05
  Filled 2014-09-05: qty 50

## 2014-09-05 MED ORDER — FENTANYL CITRATE 0.05 MG/ML IJ SOLN: 50.0000 ug | INTRAMUSCULAR | Status: DC | PRN

## 2014-09-05 MED ORDER — PROPOFOL 10 MG/ML IV BOLUS
INTRAVENOUS | Status: DC | PRN
  Administered 2014-09-05: 140 mg via INTRAVENOUS

## 2014-09-05 MED ORDER — ONDANSETRON HCL 4 MG/2ML IJ SOLN
INTRAMUSCULAR | Status: DC | PRN
  Administered 2014-09-05: 4 mg via INTRAVENOUS

## 2014-09-05 MED ORDER — HYDROCODONE-ACETAMINOPHEN 5-325 MG PO TABS: 1.0000 | ORAL_TABLET | ORAL | Status: DC | PRN

## 2014-09-05 MED ORDER — LIDOCAINE HCL (PF) 1 % IJ SOLN
INTRAMUSCULAR | Status: AC
  Filled 2014-09-05: qty 30

## 2014-09-05 MED ORDER — FENTANYL CITRATE 0.05 MG/ML IJ SOLN
INTRAMUSCULAR | Status: AC
  Filled 2014-09-05: qty 4

## 2014-09-05 MED ORDER — MIDAZOLAM HCL 5 MG/5ML IJ SOLN
INTRAMUSCULAR | Status: DC | PRN
  Administered 2014-09-05: 2 mg via INTRAVENOUS

## 2014-09-05 MED ORDER — LACTATED RINGERS IV SOLN
INTRAVENOUS | Status: DC
  Administered 2014-09-05: 10:00:00 via INTRAVENOUS

## 2014-09-05 MED ORDER — OXYCODONE HCL 5 MG/5ML PO SOLN: 5.0000 mg | Freq: Once | ORAL | Status: AC | PRN

## 2014-09-05 MED ORDER — CEFAZOLIN SODIUM-DEXTROSE 2-3 GM-% IV SOLR
INTRAVENOUS | Status: AC
  Filled 2014-09-05: qty 50

## 2014-09-05 MED ORDER — BUPIVACAINE HCL (PF) 0.25 % IJ SOLN
INTRAMUSCULAR | Status: AC
  Filled 2014-09-05: qty 30

## 2014-09-05 SURGICAL SUPPLY — 47 items
BLADE SURG 10 STRL SS (BLADE) IMPLANT
BLADE SURG 15 STRL LF DISP TIS (BLADE) ×1 IMPLANT
BLADE SURG 15 STRL SS (BLADE) ×2
CANISTER SUCT 1200ML W/VALVE (MISCELLANEOUS) ×2 IMPLANT
CHLORAPREP W/TINT 26ML (MISCELLANEOUS) ×2 IMPLANT
CLIP TI MEDIUM 6 (CLIP) IMPLANT
CLIP TI WIDE RED SMALL 6 (CLIP) ×1 IMPLANT
COVER BACK TABLE 60X90IN (DRAPES) ×2 IMPLANT
COVER MAYO STAND STRL (DRAPES) ×2 IMPLANT
DEVICE DUBIN W/COMP PLATE 8390 (MISCELLANEOUS) IMPLANT
DRAPE LAPAROTOMY 100X72 PEDS (DRAPES) ×2 IMPLANT
DRAPE UTILITY XL STRL (DRAPES) ×2 IMPLANT
ELECT COATED BLADE 2.86 ST (ELECTRODE) ×2 IMPLANT
ELECT REM PT RETURN 9FT ADLT (ELECTROSURGICAL) ×2
ELECTRODE REM PT RTRN 9FT ADLT (ELECTROSURGICAL) ×1 IMPLANT
GLOVE BIOGEL PI IND STRL 7.0 (GLOVE) IMPLANT
GLOVE BIOGEL PI IND STRL 8 (GLOVE) ×1 IMPLANT
GLOVE BIOGEL PI INDICATOR 7.0 (GLOVE) ×1
GLOVE BIOGEL PI INDICATOR 8 (GLOVE) ×1
GLOVE ECLIPSE 6.5 STRL STRAW (GLOVE) ×1 IMPLANT
GLOVE ECLIPSE 7.5 STRL STRAW (GLOVE) ×2 IMPLANT
GOWN STRL REUS W/ TWL LRG LVL3 (GOWN DISPOSABLE) ×1 IMPLANT
GOWN STRL REUS W/ TWL XL LVL3 (GOWN DISPOSABLE) ×1 IMPLANT
GOWN STRL REUS W/TWL LRG LVL3 (GOWN DISPOSABLE) ×2
GOWN STRL REUS W/TWL XL LVL3 (GOWN DISPOSABLE) ×2
KIT MARKER MARGIN INK (KITS) IMPLANT
LIQUID BAND (GAUZE/BANDAGES/DRESSINGS) ×1 IMPLANT
NDL HYPO 25X1 1.5 SAFETY (NEEDLE) ×1 IMPLANT
NEEDLE HYPO 25X1 1.5 SAFETY (NEEDLE) ×2 IMPLANT
NS IRRIG 1000ML POUR BTL (IV SOLUTION) ×2 IMPLANT
PACK BASIN DAY SURGERY FS (CUSTOM PROCEDURE TRAY) ×2 IMPLANT
PENCIL BUTTON HOLSTER BLD 10FT (ELECTRODE) ×2 IMPLANT
SLEEVE SCD COMPRESS KNEE MED (MISCELLANEOUS) ×1 IMPLANT
STAPLER VISISTAT 35W (STAPLE) IMPLANT
SUT MNCRL AB 4-0 PS2 18 (SUTURE) ×1 IMPLANT
SUT MON AB 5-0 PS2 18 (SUTURE) IMPLANT
SUT SILK 3 0 SH 30 (SUTURE) ×1 IMPLANT
SUT VIC AB 3-0 SH 27 (SUTURE)
SUT VIC AB 3-0 SH 27X BRD (SUTURE) IMPLANT
SUT VIC AB 4-0 BRD 54 (SUTURE) IMPLANT
SUT VICRYL 3-0 CR8 SH (SUTURE) ×1 IMPLANT
SYR BULB 3OZ (MISCELLANEOUS) IMPLANT
SYR CONTROL 10ML LL (SYRINGE) ×2 IMPLANT
TOWEL OR 17X24 6PK STRL BLUE (TOWEL DISPOSABLE) ×3 IMPLANT
TOWEL OR NON WOVEN STRL DISP B (DISPOSABLE) ×2 IMPLANT
TUBE CONNECTING 20X1/4 (TUBING) ×2 IMPLANT
YANKAUER SUCT BULB TIP NO VENT (SUCTIONS) ×2 IMPLANT

## 2014-09-05 NOTE — Anesthesia Postprocedure Evaluation (Signed)
Anesthesia Post Note  Patient: Janice Higgins  Procedure(s) Performed: Procedure(s) (LRB): RE-EXCISION OF BREAST LUMPECTOMY (Left)  Anesthesia type: General  Patient location: PACU  Post pain: Pain level controlled and Adequate analgesia  Post assessment: Post-op Vital signs reviewed, Patient's Cardiovascular Status Stable, Respiratory Function Stable, Patent Airway and Pain level controlled  Last Vitals:  Filed Vitals:   09/05/14 1240  BP:   Pulse: 85  Temp:   Resp: 15    Post vital signs: Reviewed and stable  Level of consciousness: awake, alert  and oriented  Complications: No apparent anesthesia complications

## 2014-09-05 NOTE — Anesthesia Procedure Notes (Signed)
Procedure Name: LMA Insertion Date/Time: 09/05/2014 11:16 AM Performed by: Maryella Shivers Pre-anesthesia Checklist: Patient identified, Emergency Drugs available, Suction available and Patient being monitored Patient Re-evaluated:Patient Re-evaluated prior to inductionOxygen Delivery Method: Circle System Utilized Preoxygenation: Pre-oxygenation with 100% oxygen Intubation Type: IV induction Ventilation: Mask ventilation without difficulty LMA: LMA inserted LMA Size: 4.0 Number of attempts: 1 Airway Equipment and Method: Bite block Placement Confirmation: positive ETCO2 Tube secured with: Tape Dental Injury: Teeth and Oropharynx as per pre-operative assessment

## 2014-09-05 NOTE — Anesthesia Preprocedure Evaluation (Addendum)
Anesthesia Evaluation  Patient identified by MRN, date of birth, ID band Patient awake    Reviewed: Allergy & Precautions, NPO status , Patient's Chart, lab work & pertinent test results  Airway Mallampati: II   Neck ROM: full    Dental   Pulmonary neg pulmonary ROS,  breath sounds clear to auscultation        Cardiovascular negative cardio ROS  Rhythm:regular Rate:Normal     Neuro/Psych  Headaches,    GI/Hepatic   Endo/Other    Renal/GU      Musculoskeletal   Abdominal   Peds  Hematology   Anesthesia Other Findings   Reproductive/Obstetrics Breast CA                             Anesthesia Physical Anesthesia Plan  ASA: I  Anesthesia Plan: General and General LMA   Post-op Pain Management:    Induction:   Airway Management Planned:   Additional Equipment:   Intra-op Plan:   Post-operative Plan:   Informed Consent:   Plan Discussed with:   Anesthesia Plan Comments:         Anesthesia Quick Evaluation

## 2014-09-05 NOTE — Discharge Instructions (Signed)
[place] Office Phone Number [phone]  BREAST BIOPSY/ PARTIAL MASTECTOMY: POST OP INSTRUCTIONS  Always review your discharge instruction sheet given to you by the facility where your surgery was performed.  IF YOU HAVE DISABILITY OR FAMILY LEAVE FORMS, YOU MUST BRING THEM TO THE OFFICE FOR PROCESSING.  DO NOT GIVE THEM TO YOUR DOCTOR.  1. A prescription for pain medication may be given to you upon discharge.  Take your pain medication as prescribed, if needed.  If narcotic pain medicine is not needed, then you may take acetaminophen (Tylenol) or ibuprofen (Advil) as needed. 2. Take your usually prescribed medications unless otherwise directed 3. If you need a refill on your pain medication, please contact your pharmacy.  They will contact our office to request authorization.  Prescriptions will not be filled after 5pm or on week-ends. 4. You should eat very light the first 24 hours after surgery, such as soup, crackers, pudding, etc.  Resume your normal diet the day after surgery. 5. Most patients will experience some swelling and bruising in the breast.  Ice packs and a good support bra will help.  Swelling and bruising can take several days to resolve.  6. It is common to experience some constipation if taking pain medication after surgery.  Increasing fluid intake and taking a stool softener will usually help or prevent this problem from occurring.  A mild laxative (Milk of Magnesia or Miralax) should be taken according to package directions if there are no bowel movements after 48 hours. 7. Unless discharge instructions indicate otherwise, you may remove your bandages 24-48 hours after surgery, and you may shower at that time.  You may have steri-strips (small skin tapes) in place directly over the incision.  These strips should be left on the skin for 7-10 days.  If your surgeon used skin glue on the incision, you may shower in 24 hours.  The glue will flake off over the  next 2-3 weeks.  Any sutures or staples will be removed at the office during your follow-up visit. 8. ACTIVITIES:  You may resume regular daily activities (gradually increasing) beginning the next day.  Wearing a good support bra or sports bra minimizes pain and swelling.  You may have sexual intercourse when it is comfortable. a. You may drive when you no longer are taking prescription pain medication, you can comfortably wear a seatbelt, and you can safely maneuver your car and apply brakes. b. RETURN TO WORK:  ______________________________________________________________________________________ 9. You should see your doctor in the office for a follow-up appointment approximately two weeks after your surgery.  Your doctors nurse will typically make your follow-up appointment when she calls you with your pathology report.  Expect your pathology report 2-3 business days after your surgery.  You may call to check if you do not hear from [place] after three days. 10. OTHER INSTRUCTIONS: _______________________________________________________________________________________________ _____________________________________________________________________________________________________________________________________ _____________________________________________________________________________________________________________________________________ _____________________________________________________________________________________________________________________________________  WHEN TO CALL YOUR DOCTOR: 1. Fever over 101.0 2. Nausea and/or vomiting. 3. Extreme swelling or bruising. 4. Continued bleeding from incision. 5. Increased pain, redness, or drainage from the incision.  The clinic staff is available to answer your questions during regular business hours.  Please dont hesitate to call and ask to speak to one of the nurses for clinical concerns.  If you have a medical emergency, go to the nearest  emergency room or call 911.  A surgeon from [facility] is always on call at the hospital.  For further questions, please visit [email or web address] Central  [place] Office Phone Number [phone]  BREAST BIOPSY/ PARTIAL MASTECTOMY: POST OP INSTRUCTIONS  Always review your discharge instruction sheet given to you by the facility where your surgery was performed.  IF YOU HAVE DISABILITY OR FAMILY LEAVE FORMS, YOU MUST BRING THEM TO THE OFFICE FOR PROCESSING.  DO NOT GIVE THEM TO YOUR DOCTOR.  11. A prescription for pain medication may be given to you upon discharge.  Take your pain medication as prescribed, if needed.  If narcotic pain medicine is not needed, then you may take acetaminophen (Tylenol) or ibuprofen (Advil) as needed. 12. Take your usually prescribed medications unless otherwise directed 13. If you need a refill on your pain medication, please contact your pharmacy.  They will contact our office to request authorization.  Prescriptions will not be filled after 5pm or on week-ends. 14. You should eat very light the first 24 hours after surgery, such as soup, crackers, pudding, etc.  Resume your normal diet the day after surgery. 15. Most patients will experience some swelling and bruising in the breast.  Ice packs and a good support bra will help.  Swelling and bruising can take several days to resolve.  16. It is common to experience some constipation if taking pain medication after surgery.  Increasing fluid intake and taking a stool softener will usually help or prevent this problem from occurring.  A mild laxative (Milk of Magnesia or Miralax) should be taken according to package directions if there are no bowel movements after 48 hours. 17. Unless discharge instructions indicate otherwise, you may remove your bandages 24-48 hours after surgery, and you may shower at that time.  You may have steri-strips (small skin tapes) in place directly over the  incision.  These strips should be left on the skin for 7-10 days.  If your surgeon used skin glue on the incision, you may shower in 24 hours.  The glue will flake off over the next 2-3 weeks.  Any sutures or staples will be removed at the office during your follow-up visit. 18. ACTIVITIES:  You may resume regular daily activities (gradually increasing) beginning the next day.  Wearing a good support bra or sports bra minimizes pain and swelling.  You may have sexual intercourse when it is comfortable. a. You may drive when you no longer are taking prescription pain medication, you can comfortably wear a seatbelt, and you can safely maneuver your car and apply brakes. b. RETURN TO WORK:  ______________________________________________________________________________________ 19. You should see your doctor in the office for a follow-up appointment approximately two weeks after your surgery.  Your doctors nurse will typically make your follow-up appointment when she calls you with your pathology report.  Expect your pathology report 2-3 business days after your surgery.  You may call to check if you do not hear from [place] after three days. 20. OTHER INSTRUCTIONS: _______________________________________________________________________________________________ _____________________________________________________________________________________________________________________________________ _____________________________________________________________________________________________________________________________________ _____________________________________________________________________________________________________________________________________  WHEN TO CALL YOUR DOCTOR: 6. Fever over 101.0 7. Nausea and/or vomiting. 8. Extreme swelling or bruising. 9. Continued bleeding from incision. 10. Increased pain, redness, or drainage from the incision.  The clinic staff is available to answer your questions  during regular business hours.  Please dont hesitate to call and ask to speak to one of the nurses for clinical concerns.  If you have a medical emergency, go to the nearest emergency room or call 911.  A surgeon from [facility] is always on call at the hospital.  For further questions, please visit [email or web address]  Post Anesthesia Home Care Instructions ° °Activity: °Get plenty of rest for the remainder of the day. A responsible adult should stay with you for 24 hours following the procedure.  °For the next 24 hours, DO NOT: °-Drive a car °-Operate machinery °-Drink alcoholic beverages °-Take any medication unless instructed by your physician °-Make any legal decisions or sign important papers. ° °Meals: °Start with liquid foods such as gelatin or soup. Progress to regular foods as tolerated. Avoid greasy, spicy, heavy foods. If nausea and/or vomiting occur, drink only clear liquids until the nausea and/or vomiting subsides. Call your physician if vomiting continues. ° °Special Instructions/Symptoms: °Your throat may feel dry or sore from the anesthesia or the breathing tube placed in your throat during surgery. If this causes discomfort, gargle with warm salt water. The discomfort should disappear within 24 hours. ° °

## 2014-09-05 NOTE — Interval H&P Note (Signed)
History and Physical Interval Note:  09/05/2014 10:44 AM Re excision margin again showed DCIS within 12mm of new inferior margin.  After further discussion we have elected to proceed with further re excision of the inferior margin.  Risks and possible outcomes as we have discussed previously again reviewed with pt and family Janice Higgins  has presented today for surgery, with the diagnosis of DCIS left breast  The various methods of treatment have been discussed with the patient and family. After consideration of risks, benefits and other options for treatment, the patient has consented to  Procedure(s): RE-EXCISION OF BREAST LUMPECTOMY (Left) as a surgical intervention .  The patient's history has been reviewed, patient examined, no change in status, stable for surgery.  I have reviewed the patient's chart and labs.  Questions were answered to the patient's satisfaction.     Janice Higgins T

## 2014-09-05 NOTE — Transfer of Care (Signed)
Immediate Anesthesia Transfer of Care Note  Patient: Janice Higgins  Procedure(s) Performed: Procedure(s): RE-EXCISION OF BREAST LUMPECTOMY (Left)  Patient Location: PACU  Anesthesia Type:General  Level of Consciousness: sedated  Airway & Oxygen Therapy: Patient Spontanous Breathing and Patient connected to face mask oxygen  Post-op Assessment: Report given to RN and Post -op Vital signs reviewed and stable  Post vital signs: Reviewed and stable  Last Vitals:  Filed Vitals:   09/05/14 0953  BP: 126/69  Pulse: 79  Temp: 36.8 C  Resp: 20    Complications: No apparent anesthesia complications

## 2014-09-05 NOTE — Op Note (Signed)
Preoperative Diagnosis: DCIS left breast  Postoprative Diagnosis: DCIS left breast  Procedure: Procedure(s): RE-EXCISION OF BREAST LUMPECTOMY   Surgeon: Excell Seltzer T   Assistants: None  Anesthesia:  General LMA anesthesia  Indications: Patient is a 59 year old female recently status post left breast lumpectomy for abnormal mammogram and core biopsy revealing papillomatosis. Initial lumpectomy revealed DCIS with a negative but close inferior margin of less than 1 mm. After discussion we proceeded with reexcision of her inferior margin which again revealed DCIS microscopically negative but within 1 mm. Again after discussion of various surgical treatment options we have elected to proceed again with reexcision of the inferior margin of her left breast lumpectomy. Indications and risks have been discussed in detailed elsewhere    Procedure Detail:  Patient was brought to the operating room, placed in supine position on the operating table, and laryngeal mask general anesthesia induced. She received preoperative IV antibiotics. The left breast was widely sterilely prepped and draped. Patient timeout was performed and correct procedure verified. The previous incision was opened sharply and previous suture material removed. The lumpectomy cavity was widely opened and explored and I identified the previously placed marking clip at the inferior margin of the lumpectomy cavity. The inferior margin was exposed and then widely excised back another approximately 5-6 mm. This margin was oriented with suture. It was sent for permanent pathology. The lumpectomy cavity was irrigated and complete hemostasis assured. The soft tissue was infiltrated with Marcaine. Deep and subcutaneous tissues closed with interrupted 3-0 Vicryl and the skin with subcuticular 4-0 Monocryl and Dermabond. Sponge needle and instrument counts were correct.    Findings: As above  Estimated Blood Loss:  Minimal          Drains: none          Specimens: Reexcision left breast lumpectomy inferior margin oriented with suture on new margin surface        Complications:  * No complications entered in OR log *         Disposition: PACU - hemodynamically stable.         Condition: stable

## 2014-09-05 NOTE — H&P (View-Only) (Signed)
Janice Higgins 08/22/2014   [facility] Patient #: [id number] DOB: 1955/08/14 Married / Language: English / Race: White Female  History of Present Illness Marland Kitchen T. Candy Ziegler MD; 07/29/2014 2:08 PM) Patient words: New Left Breast.  The patient is a 59 year old female with a recent Dx of DCIS left breast. She recently presented for her routine screening mammogram. This showed an area of asymmetry in the left breast and further imaging was recommended. Diagnostic mammogram with spot compression views revealed a small oval mass laterally in the left breast at the 3 o'clock position. Ultrasound confirmed a slightly irregular oval hypoechoic mass measuring 5 x 5 x 4 mm at the 3:30 position in the left breast 4 cm from the nipple. A large core needle biopsy was recommended and performed. This  revealed ductal papillomatosis. Complete excision was recommended.  This was performed last week revealing DCIS, 55mm, within 1 mm of the inferior margin.  I have reccommended re excision of the inferior margin. She has experienced no breast symptoms specifically lump or nipple discharge or skin changes or pain. She has no personal history or family history of breast problems or breast cancer. She has not had any problems since the biopsy.   Other Problems Erasmo Leventhal, RN, BSN; 07/29/2014 1:49 PM) Arthritis Hemorrhoids Kidney Stone  Past Surgical History Erasmo Leventhal, RN, BSN; 07/29/2014 1:49 PM) Oral Surgery Tonsillectomy  Diagnostic Studies History (Erasmo Leventhal, RN, BSN; 07/29/2014 1:49 PM) Colonoscopy never Mammogram within last year Pap Smear 1-5 years ago  Allergies Erasmo Leventhal, RN, BSN; 07/29/2014 1:50 PM) Sulfabenzamide *CHEMICALS*  Medication History Erasmo Leventhal, RN, BSN; 07/29/2014 1:52 PM) Diclofenac Sodium (75MG  Tablet DR, Oral daily) Active.  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 No alcohol  use No drug use Tobacco use Never smoker.  Family History (Erasmo Leventhal, RN, BSN; 07/29/2014 1:49 PM) Arthritis Father. Depression Daughter. Diabetes Mellitus Family Members In General. Heart Disease Mother. Heart disease in female family member before age 58 Kidney Disease Mother. Respiratory Condition Father, Mother. Thyroid problems Father.  Pregnancy / Birth History (Erasmo Leventhal, RN, BSN; 07/29/2014 1:49 PM) Age at menarche 75 years. 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 Skin Not Present- Change in Wart/Mole, Dryness, Hives, Jaundice, New Lesions, Non-Healing Wounds, Rash and Ulcer. HEENT Present- Seasonal Allergies. Not Present- Earache, Hearing Loss, Hoarseness, Nose Bleed, Oral Ulcers, Ringing in the Ears, Sinus Pain, Sore Throat, Visual Disturbances, Wears glasses/contact lenses and Yellow Eyes. Respiratory Present- Chronic Cough. Not Present- Bloody sputum, Difficulty Breathing, Snoring and Wheezing. Breast Present- Breast Mass. Not Present- Breast Pain, Nipple Discharge and Skin Changes. Cardiovascular Not Present- Chest Pain, Difficulty Breathing Lying Down, Leg Cramps, Palpitations, Rapid Heart Rate, Shortness of Breath and Swelling of Extremities. Gastrointestinal Not Present- Abdominal Pain, Bloating, Bloody Stool, Change in Bowel Habits, Chronic diarrhea, Constipation, Difficulty Swallowing, Excessive gas, Gets full quickly at meals, Hemorrhoids, Indigestion, Nausea, Rectal Pain and Vomiting. Female Genitourinary Present- Frequency. Not Present- Nocturia, Painful Urination, Pelvic Pain and Urgency. Musculoskeletal Present- Joint Pain and Joint Stiffness. Not Present- Back Pain, Muscle Pain, Muscle Weakness and Swelling of Extremities. Neurological Present-  Headaches. Not Present- Decreased Memory, Fainting, Numbness, Seizures, Tingling, Tremor, Trouble walking and Weakness. Psychiatric Not Present- Anxiety, Bipolar, Change in Sleep Pattern, Depression, Fearful and Frequent crying.  Endocrine Not Present- Cold Intolerance, Excessive Hunger, Hair Changes, Heat Intolerance, Hot flashes and New Diabetes. Hematology Not Present- Easy Bruising, Excessive bleeding, Gland problems, HIV and Persistent Infections.   Vitals Occupational hygienist, BSN; 07/29/2014 1:50 PM) 07/29/2014 1:50 PM Weight: 161.2 lb Height: 61in Body Surface Area: 1.77 m Body Mass Index: 30.46 kg/m Temp.: 98.47F(Oral)  Pulse: 78 (Regular)  Resp.: 20 (Unlabored)  BP: 126/82 (Sitting, Left Arm, Standard)    Physical Exam Marland Kitchen T. Montina Dorrance MD; 07/29/2014 2:08 PM) The physical exam findings are as follows: Note:General: Alert, well-developed and well nourished Caucasian female, in no distress Skin: Warm and dry without rash or infection. HEENT: No palpable masses or thyromegaly. Sclera nonicteric. Pupils equal round and reactive. Breasts: No palpable masses or other abnormalities in either breast. No nipple discharge. No palpable axillary adenopathy. Lymph nodes: No cervical, supraclavicular, or inguinal nodes palpable. Lungs: Breath sounds clear and equal. No wheezing or increased work of breathing. Cardiovascular: Regular rate and rhythm without murmer. No JVD or edema. Peripheral pulses intact. No carotid bruits. Extremities: No edema or joint swelling or deformity. No chronic venous stasis changes. Neurologic: Alert and fully oriented. Gait normal. No focal weakness. Psychiatric: Normal mood and affect. Thought content appropriate with normal judgement and insight    Assessment & Plan Marland Kitchen T. Girtrude Enslin MD; 07/29/2014 2:18 PM) DCIS left breast, close inferior margin on excisional biopsy  Plan re excision of inferior margin left breast  lumpectomy   Signed by Edward Jolly, MD (07/29/2014 2:22 PM)

## 2014-09-06 ENCOUNTER — Encounter (HOSPITAL_BASED_OUTPATIENT_CLINIC_OR_DEPARTMENT_OTHER): Payer: Self-pay | Admitting: General Surgery

## 2014-09-12 NOTE — Progress Notes (Addendum)
Location of Breast Cancer:Left breast dcis  Histology per Pathology Report:08/23/14 Breast, excision, additional inferior margin, left - INTERMEDIATE GRADE DUCTAL CARCINOMA IN SITU. - DUCTAL CARCINOMA IN SITU IS FOCALLY LESS THAN 0.1 CM TO THE NEAREST NEW INFERIOR MARGIN.  Specimen Gross and Clinical Info Receptor Status: ER(+), PR (+), Her2-neu ()  Did patient present with symptoms (if so, please note symptoms) or was this found on screening mammography?: 06/21/14  Ultrasound left breast 5 mm mass at 3:30 position 05/31/15 Digital screening mammogram.  Past/Anticipated interventions by surgeon, if any:08/12/14 BREAST LUMPECTOMY WITH RADIOACTIVE SEED LOCALIZATION  09/05/14:RE-EXCISION OF BREAST LUMPECTOMY  Past/Anticipated interventions by medical oncology, if any: going to see Dr. Whitney Muse 09/28/14 for follow up  Lymphedema issues, if any: no   Pain issues, if any: breast tenderness at night takes Vicodin before bed  SAFETY ISSUES:  Prior radiation? No  Pacemaker/ICD?No   Possible current pregnancy?No  Is the patient on methotrexate?No  Current Complaints / other details:Menarche 13, G1 P1, age of birth Hormonal replacement therapy Allergies to sulfa No smoking or etoh history.    Arlyss Repress, RN 09/12/2014,2:44 PM

## 2014-09-14 ENCOUNTER — Encounter: Payer: Self-pay | Admitting: Radiation Oncology

## 2014-09-15 ENCOUNTER — Encounter: Payer: Self-pay | Admitting: Adult Health

## 2014-09-15 ENCOUNTER — Encounter: Payer: Self-pay | Admitting: *Deleted

## 2014-09-15 ENCOUNTER — Encounter: Payer: Self-pay | Admitting: Radiation Oncology

## 2014-09-15 ENCOUNTER — Ambulatory Visit
Admission: RE | Admit: 2014-09-15 | Discharge: 2014-09-15 | Disposition: A | Discharge status: Home / Self Care | Payer: 59 | Source: Ambulatory Visit | Attending: Radiation Oncology | Admitting: Radiation Oncology

## 2014-09-15 VITALS — BP 137/74 | HR 64 | Temp 97.9°F | Resp 12 | Wt 154.5 lb

## 2014-09-15 DIAGNOSIS — Z510 Encounter for antineoplastic radiation therapy: Secondary | ICD-10-CM | POA: Diagnosis present

## 2014-09-15 DIAGNOSIS — N6012 Diffuse cystic mastopathy of left breast: Secondary | ICD-10-CM

## 2014-09-15 DIAGNOSIS — D0512 Intraductal carcinoma in situ of left breast: Secondary | ICD-10-CM | POA: Diagnosis not present

## 2014-09-15 DIAGNOSIS — D242 Benign neoplasm of left breast: Secondary | ICD-10-CM | POA: Insufficient documentation

## 2014-09-15 NOTE — Addendum Note (Signed)
Addendum  created 09/15/14 3220 by Albertha Ghee, MD   Modules edited: Anesthesia Attestations

## 2014-09-15 NOTE — Progress Notes (Signed)
[place] Psychosocial Distress Screening Clinical Social Work  Clinical Social Work was referred by distress screening protocol.  The patient scored a 9 on the Psychosocial Distress Thermometer which indicates severe distress. Clinical Social Worker met with pt and her husband today after her radonc consult to assess for distress and other psychosocial needs. Pt lives in [place] with her husband and 58 yo grandson. He recently moved in with them and this has been a big change for them. She shared it is a good change, but has resulted in a little adjustment as well. She shared she was anxious about transportation today, but husband reports his work is very flexible and he feels he can bring her daily with out incident. CSW discussed resources through financial counseling for gas cards, Therapist, art for Nutritional therapist and reviewed [employer] calendar. CSW also reviewed Pt and Family Support Team members that are available to assist her along her cancer journey. Pt shared her concerns with anxiety and depression are mild and are due to her diagnosis. She currently feels she is managing this well. CSW discussed some common emotions pt experience and also reviewed coping techniques. CSW encouraged pt to reach out to CSW, MD and/or Pt and Family Support Team for support if anxiety and/or depression become an issue for her. Pt and husband aware and agree to contact as needed.   ONCBCN DISTRESS SCREENING 09/15/2014  Screening Type Initial Screening  Distress experienced in past week (1-10) 9  Practical problem type Work/school;Transportation  Family Problem type Children  Emotional problem type Depression;Nervousness/Anxiety;Adjusting to illness  Spiritual/Religous concerns type Relating to God  Information Concerns Type Lack of info about treatment  Physical Problem type Sleep/insomnia;Other (comment)  Physician notified of physical symptoms Yes  Referral to clinical psychology Yes    Clinical  Social Worker follow up needed: No.  If yes, follow up plan:  Loren Racer, [place]  [facility] Phone: [phone] Fax: [phone]

## 2014-09-15 NOTE — Progress Notes (Addendum)
  Radiation Oncology         [id number]) [phone] ________________________________  Initial outpatient Consultation - Date: 09/15/2014   Name: Janice Higgins MRN: [id number]   DOB: April 11, 1956  REFERRING PHYSICIAN: Vallery Ridge*  DIAGNOSIS:    ICD-9-CM ICD-10-CM   1. 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 Ductal carcinoma in situ (DCIS) of left breast 233.0 D05.12     STAGE: 0  HISTORY OF PRESENT ILLNESS::Janice Higgins is a 59 y.o. female  Who presented with an abnormal mammogram. This was biopsied and was a papilloma. She underwent excision which showed associated DCIS with a positive margin for DCIS. She underwent re-excision on 2/8 which was close and then a final re-excision on 2/22 which was negative. She has met with Dr. Whitney Muse to discuss adjuvant antiestrogen treatment. She does not require a pre-RT mammogram as she did not have calcs on her original. She is accompanied by her husband. She is G1P1 with Menarche at 61. She was on HRT many years ago. She works at [employer]. She has no family history of breast cancer.   PREVIOUS RADIATION THERAPY: No  Past medical, social and family history were reviewed in the electronic chart. Review of symptoms was reviewed in the electronic chart. Medications were reviewed in the electronic chart.   PHYSICAL EXAM:  Filed Vitals:   09/15/14 0831  BP: 137/74  Pulse: 64  Temp: 97.9 F (36.6 C)  Resp: 12  .154 lb 8 oz (70.081 kg). Pleasant female no distress. Incision on the left breast is healing well with no signs of infection. There eis a palpable seroma under the incision which is tender. No lymphadenopathy bilaterally.   IMPRESSION: DCIS of the left breast  PLAN: We discussed the role of radiation and decreasing local failures in patients who undergo lumpectomy.  I have recommended radiation to the whole breast. We discussed the process of simulation and the placement of tattoos. We discussed  possible side effects during treatment including but not limited to skin irritation darkness and fatigue. We discussed long-term effects of treatment which are extremely unlikely but possible including damage to the lungs and ribs.  We discussed the use of breath hold technique for cardiac sparing if necessary. We discussed the low likelihood of secondary malignancies. We discussed the possible late side effects including but not limited to permanent skin darkening, and breast swelling.   We discussed her unlikely disease course again and I think she is feeling more comfortable.  She signed informed consent and we scheduled her for simulation later this month. We will work with her on intermittent disability as she will need to leave work early to make it to treatment.   She was able to meet with social work, our breast cancer navigator, and our survivorship NP today.   I spent 40 minutes  face to face with the patient and more than 50% of that time was spent in counseling and/or coordination of care.   ------------------------------------------------  Thea Silversmith, MD

## 2014-09-15 NOTE — Progress Notes (Signed)
I briefly met with Ms. Paver during her Radiation Oncology consultation today with Dr. Pablo Ledger. We discussed the purpose of the Survivorship Clinic, which will include monitoring for recurrence, coordinating completion of age and gender-appropriate cancer screenings, promotion of overall wellness, as well as managing potential late/long-term side effects of anti-cancer treatments.    As of today, the intent of treatment for Ms. Redler is cure. Therefore, she will be eligible for the Survivorship Clinic upon her completion of treatment.  Her survivorship care plan (SCP) document will be drafted and updated throughout the course of her treatment trajectory. She will receive the SCP in an office visit with myself in the Survivorship Clinic once she has completed treatment.   Ms. Cothron was encouraged to ask questions and all questions were answered to her satisfaction.  She was given my business card and encouraged to contact me with any concerns regarding survivorship.  I look forward to participating in her care.   Mike Craze, NP [place] [phone]

## 2014-09-28 ENCOUNTER — Encounter (HOSPITAL_COMMUNITY): Payer: 59 | Attending: Hematology & Oncology | Admitting: Hematology & Oncology

## 2014-09-28 ENCOUNTER — Encounter (HOSPITAL_COMMUNITY): Payer: Self-pay | Admitting: Hematology & Oncology

## 2014-09-28 ENCOUNTER — Encounter: Payer: Self-pay | Admitting: *Deleted

## 2014-09-28 VITALS — BP 111/67 | HR 82 | Temp 100.0°F | Resp 18 | Wt 156.0 lb

## 2014-09-28 DIAGNOSIS — D0512 Intraductal carcinoma in situ of left breast: Secondary | ICD-10-CM

## 2014-09-28 DIAGNOSIS — F329 Major depressive disorder, single episode, unspecified: Secondary | ICD-10-CM

## 2014-09-28 NOTE — Patient Instructions (Signed)
..  [place] at [facility] Discharge Instructions  RECOMMENDATIONS MADE BY THE CONSULTANT AND ANY TEST RESULTS WILL BE SENT TO YOUR REFERRING PHYSICIAN.  Information given on Cancer support group and Star program  Return in 6 weeks  Thank you for choosing [place] at [facility] to provide your oncology and hematology care.  To afford each patient quality time with our provider, please arrive at least 15 minutes before your scheduled appointment time.    You need to re-schedule your appointment should you arrive 10 or more minutes late.  We strive to give you quality time with our providers, and arriving late affects you and other patients whose appointments are after yours.  Also, if you no show three or more times for appointments you may be dismissed from the clinic at the providers discretion.     Again, thank you for choosing [facility].  Our hope is that these requests will decrease the amount of time that you wait before being seen by our physicians.       _____________________________________________________________  Should you have questions after your visit to [facility], please contact our office at [phone] between the hours of 8:30 a.m. and 4:30 p.m.  Voicemails left after 4:30 p.m. will not be returned until the following business day.  For prescription refill requests, have your pharmacy contact our office.

## 2014-09-28 NOTE — Progress Notes (Signed)
[facility] CONSULT NOTE  Patient Care Team: Merlyn Albert, MD as PCP - General (Family Medicine) Hubbard Hartshorn, NP as Nurse Practitioner (Nurse Practitioner) Lurline Hare, MD as Consulting Physician (Radiation Oncology)  CHIEF COMPLAINTS/PURPOSE OF CONSULTATION:  Digital screening mammogram on 05/30/2014, BI-RADS Category 0 with asymmetry in the left breast  Ultrasound of the left breast on 06/21/2014 showing slightly suspicious 5 mm left breast mass located at 3:30 position  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 Johna Sheriff on 08/12/2014, final pathology with intermediate grade DCIS, ER positive 99%, PR positive 22%  Reexcision on 08/22/2014 secondary to inferior margin being focally less than 1 mm  Final excision 2/22 negative for disease  HISTORY OF PRESENTING ILLNESS:  Janice Higgins 59 y.o. female is here for follow-up of DCIS. Her final excision was on February 22 and negative. She has met with Dr. Michell Heinrich in regards to radiation and goes back to [facility] next Tuesday. She states she has been quite tearful over the last 2 weeks and it is not normal for her. She states she is having a difficult time processing having breast cancer. She is concerned because her mother and sister both have a history of depression. The patient states she has never had difficulties with this and it concerns her. She is inquiring as to resources available for cancer patients. In Index.  MEDICAL HISTORY:  Past Medical History  Diagnosis Date  . Osteoarthritis     hands and feet  . History of kidney stones   . 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 History of bronchitis     SURGICAL HISTORY: Past Surgical History  Procedure Laterality Date  . Foot surgery Right 1995    exc. bone spur  . Tonsillectomy    . Tubal ligation    . Lasik    . Breast lumpectomy  with radioactive seed localization Left 08/12/2014    Procedure:  RADIOACTIVE SEED LOCALIZATION LEFT BREAST LUMPECTOMY;  Surgeon: Glenna Fellows, MD;  Location:  SURGERY CENTER;  Service: General;  Laterality: Left;  . Re-excision of breast lumpectomy Left 08/22/2014    Procedure: RE-EXCISION OF LEFT BREAST LUMPECTOMY;  Surgeon: Glenna Fellows, MD;  Location: [facility] OR;  Service: General;  Laterality: Left;  . Re-excision of breast lumpectomy Left 09/05/2014    Procedure: RE-EXCISION OF BREAST LUMPECTOMY;  Surgeon: Glenna Fellows, MD;  Location:  SURGERY CENTER;  Service: General;  Laterality: Left;    SOCIAL HISTORY: History   Social History  . Marital Status: Married    Spouse Name: N/A  . Number of Children: N/A  . Years of Education: N/A   Occupational History  . Not on file.   Social History Main Topics  . Smoking status: Never Smoker   . Smokeless tobacco: Never Used  . Alcohol Use: No  . Drug Use: No  . Sexual Activity: Yes    Birth Control/ Protection: Post-menopausal   Other Topics Concern  . Not on file   Social History Narrative   she is married for 36 years. She was born and [place]. She has worked at [employer] for greater than 30 years.  FAMILY HISTORY: Family History  Problem Relation Age of Onset  . Hypertension Father   . Hypertension Mother   . Heart disease Mother 21   indicated that her mother is deceased. She indicated that her father is alive.  Her mother died 53 years ago at the age of 106 from an MI. She had asthma. Her father is alive at 10 with a new diagnosis of hypertension and thyroid problems. He is very independent. She had one brother that died at the age of 61 months from "water head". She has a sister who she states has psychiatric illness. Her nephew had the same disease as her brother and died at the age of 53.  ALLERGIES:  is allergic to sulfa antibiotics.  MEDICATIONS:  Current Outpatient Prescriptions    Medication Sig Dispense Refill  . diclofenac (VOLTAREN) 75 MG EC tablet TAKE 1 TABLET BY MOUTH TWICE DAILY WITH MEALS 60 tablet 5  . HYDROcodone-acetaminophen (NORCO/VICODIN) 5-325 MG per tablet Take 1-2 tablets by mouth every 4 (four) hours as needed for moderate pain or severe pain. 20 tablet 0  . albuterol (PROVENTIL HFA;VENTOLIN HFA) 108 (90 BASE) MCG/ACT inhaler Inhale 2 puffs into the lungs every 4 (four) hours as needed for wheezing. (Patient not taking: Reported on 09/28/2014) 1 Inhaler 5   No current facility-administered medications for this visit.    Review of Systems  Constitutional: Negative for fever, chills, weight loss and malaise/fatigue.  HENT: Negative for congestion, hearing loss, nosebleeds, sore throat and tinnitus.   Eyes: Negative for blurred vision, double vision, pain and discharge.  Respiratory: Negative for cough, hemoptysis, sputum production, shortness of breath and wheezing.   Cardiovascular: Negative for chest pain, palpitations, claudication, leg swelling and PND.  Gastrointestinal: Negative for heartburn, nausea, vomiting, abdominal pain, diarrhea, constipation, blood in stool and melena.  Genitourinary: Negative for dysuria, urgency, frequency and hematuria.  Musculoskeletal: Negative for myalgias, joint pain and falls.  Skin: Negative for itching and rash.  Neurological: Negative for dizziness, tingling, tremors, sensory change, speech change, focal weakness, seizures, loss of consciousness, weakness and headaches.  Endo/Heme/Allergies: Does not bruise/bleed easily.  Psychiatric/Behavioral: Positive for depression. Negative for suicidal ideas, memory loss and substance abuse. The patient is not nervous/anxious and does not have insomnia.     PHYSICAL EXAMINATION: ECOG PERFORMANCE STATUS: 0 - Asymptomatic  Filed Vitals:   09/28/14 1132  BP: 111/67  Pulse: 82  Temp: 100 F (37.8 C)  Resp: 18   Filed Weights   09/28/14 1132  Weight: 156 lb (70.761  kg)     Physical Exam  Constitutional: She is oriented to person, place, and time and well-developed, well-nourished, and in no distress.  HENT:  Head: Normocephalic and atraumatic.  Nose: Nose normal.  Mouth/Throat: Oropharynx is clear and moist. No oropharyngeal exudate.  Eyes: Conjunctivae and EOM are normal. Pupils are equal, round, and reactive to light. Right eye exhibits no discharge. Left eye exhibits no discharge. No scleral icterus.  Neck: Normal range of motion. Neck supple. No tracheal deviation present. No thyromegaly present.  Cardiovascular: Normal rate, regular rhythm and normal heart sounds.  Exam reveals no gallop and no friction rub.   No murmur heard. Pulmonary/Chest: Effort normal and breath sounds normal. She has no wheezes. She has no rales.  Left Breast surgical incision site is C/D/I .  No erythema or warmth, small palpable seroma under incision   Abdominal: Soft. Bowel sounds are normal. She exhibits no distension and no mass. There is no tenderness. There is no rebound and no guarding.  Musculoskeletal: Normal range of motion. She exhibits no edema.  Lymphadenopathy:    She has no cervical adenopathy.  Neurological: She is alert and oriented to person, place, and time. She has  normal reflexes. No cranial nerve deficit. Gait normal. Coordination normal.  Skin: Skin is warm and dry. No rash noted.  Psychiatric: Mood, memory, affect and judgment normal.  Nursing note and vitals reviewed.    LABORATORY DATA:  I have reviewed the data as listed Lab Results  Component Value Date   WBC 7.8 08/19/2014   HGB 12.4 09/05/2014   HCT 42.9 08/19/2014   MCV 91.9 08/19/2014   PLT 294 08/19/2014     Chemistry      Component Value Date/Time   NA 138 08/19/2014 1615   K 3.6 08/19/2014 1615   CL 104 08/19/2014 1615   CO2 27 08/19/2014 1615   BUN 9 08/19/2014 1615   CREATININE 0.65 08/19/2014 1615   CREATININE 0.58 12/13/2013 0801      Component Value Date/Time     CALCIUM 10.2 08/19/2014 1615   ALKPHOS 45 12/13/2013 0801   AST 25 12/13/2013 0801   ALT 33 12/13/2013 0801   BILITOT 0.6 12/13/2013 0801       ASSESSMENT & PLAN:   DCIS left breast  59 year old female with DCIS, ER positive. She has completed definitive surgical therapy and will be moving forward with radiation next week. She has met with Dr. Pablo Ledger. I will plan on seeing her back in 6 weeks at which time we can discuss endocrine therapy.  We discussed the process she will go through emotionally with her cancer diagnosis. I reassured her that the way she is feeling is not unexpected. I advised her if her symptoms however do not improve that she is to call and we can start her on a low-dose antidepressant to help her over the next several months. She is very open to this. I also provided her with a list of local resources here in [place] including our new support group and yoga therapy.  All questions were answered. The patient knows to call the clinic with any problems, questions or concerns.  This note was electronically signed.    Molli Hazard, MD MD 09/28/2014 12:15 PM

## 2014-09-29 NOTE — Progress Notes (Signed)
[place] Clinical Social Work  Clinical Social Work was referred by [facility] rounding for assessment of psychosocial needs due to transportation concerns.  Clinical Social Worker met with patient at [facility] to offer support and assess for needs.  CSW had met with pt at [facility] during radiation consult as well. Transportation to radonc appointments is a concern. CSW had discussed with pt assistance through [employer] and pt has SIM next week and plans to meet with fin counselor at that appointment to receive gas card assistance. Overall, pt reports she is coping well currently. CSW to follow and assist accordingly.  Clinical Social Work interventions: Emotional support Resource assistance    Loren Racer, [place] Tuesdays 8:30-1pm Wednesdays 8:30-12pm  [phone]

## 2014-10-04 ENCOUNTER — Ambulatory Visit
Admission: RE | Admit: 2014-10-04 | Discharge: 2014-10-04 | Disposition: A | Discharge status: Home / Self Care | Payer: 59 | Source: Ambulatory Visit | Attending: Radiation Oncology | Admitting: Radiation Oncology

## 2014-10-04 DIAGNOSIS — D0512 Intraductal carcinoma in situ of left breast: Secondary | ICD-10-CM

## 2014-10-04 DIAGNOSIS — Z510 Encounter for antineoplastic radiation therapy: Secondary | ICD-10-CM | POA: Diagnosis not present

## 2014-10-04 NOTE — Progress Notes (Signed)
Radiation Oncology         [phone]) [phone] ________________________________  Name: Janice Higgins      MRN: [id number]          Date: 10/04/2014              DOB: 05-07-1956  Optical Surface Tracking Plan:  Since intensity modulated radiotherapy (IMRT) and 3D conformal radiation treatment methods are predicated on accurate and precise positioning for treatment, intrafraction motion monitoring is medically necessary to ensure accurate and safe treatment delivery.  The ability to quantify intrafraction motion without excessive ionizing radiation dose can only be performed with optical surface tracking. Accordingly, surface imaging offers the opportunity to obtain 3D measurements of patient position throughout IMRT and 3D treatments without excessive radiation exposure.  I am ordering optical surface tracking for this patient's upcoming course of radiotherapy. ________________________________ Signature   Reference:   Ursula Alert, J, et al. Surface imaging-based analysis of intrafraction motion for breast radiotherapy patients.Journal of [place], n. 6, nov. 2014. ISSN [id number].   Available at: <http://www.jacmp.org/index.php/jacmp/article/view/4957>.

## 2014-10-04 NOTE — Progress Notes (Signed)
Name: Janice Higgins   MRN: [id number]  Date:  10/04/2014  DOB: [birth year]  Status:outpatient   DIAGNOSIS: Left Breast cancer.  CONSENT VERIFIED: yes SET UP: Patient is setup supine  IMMOBILIZATION:  The following immobilization was used:Custom Moldable Pillow, breast board.  NARRATIVE: [name] was brought to the [facility].  Identity was confirmed.  All relevant records and images related to the planned course of therapy were reviewed.  Then, the patient was positioned in a stable reproducible clinical set-up for radiation therapy.  Wires were placed to delineate the clinical extent of breast tissue. A wire was placed on the scar as well.  CT images were obtained.  An isocenter was placed. Skin markings were placed.  The position of the heart was then analyzed.  Due to the proximity of the heart to the chest wall, I felt she would benefit from deep inspiration breath hold for cardiac sparing.  She was then coached and rescanned in the breath hold position.  Acceptable cardiac sparing was achieved. The CT images were loaded into the planning software where the target and avoidance structures were contoured.  The radiation prescription was entered and confirmed. The patient was discharged in stable condition and tolerated simulation well.    TREATMENT PLANNING NOTE/3D Simulation Note Treatment planning then occurred. I have requested : MLC's, isodose plan, basic dose calculation  3D simulation was performed.  I personally designed and supervised the construction of 3 medically necessary complex treatment devices in the form of MLCs which will be used for beam modification and to protect critical structures including the heart and lung as well as the immobilization device which is necessary for reproducible set up.  I have requested a dose volume histogram of the heart, lung and tumor cavity.    RESPIRATORY MOTION MANAGEMENT SIMULATION - Deep Inspiration Breath Hold  NARRATIVE:  In  order to account for effect of respiratory motion on target structures and other organs in the planning and delivery of radiotherapy, this patient underwent respiratory motion management simulation.  To accomplish this, when the patient was brought to the CT simulation planning suite, a bellows was placed on the her abdomen.  Wave forms of the patient's breathing were obtained. Coaching was performed and practice sessions initiated to monitor her ability to obtain and maintain deep inspiration breath hold.  The CT images were loaded into the planning software and fused with her free breathing images by physics.  Acceptable cardiac sparing was achieved through the use of deep inspiration breath hold.  Planning will be performed on her breath hold scan

## 2014-10-07 DIAGNOSIS — Z510 Encounter for antineoplastic radiation therapy: Secondary | ICD-10-CM | POA: Diagnosis not present

## 2014-10-10 ENCOUNTER — Other Ambulatory Visit (HOSPITAL_COMMUNITY): Payer: Self-pay | Admitting: Oncology

## 2014-10-10 DIAGNOSIS — F419 Anxiety disorder, unspecified: Secondary | ICD-10-CM

## 2014-10-10 MED ORDER — ESCITALOPRAM OXALATE 10 MG PO TABS
10.0000 mg | ORAL_TABLET | Freq: Every day | ORAL | Status: DC
Start: 2014-10-10 — End: 2015-01-30

## 2014-10-11 ENCOUNTER — Ambulatory Visit: Admission: RE | Admit: 2014-10-11 | Payer: 59 | Source: Ambulatory Visit | Admitting: Radiation Oncology

## 2014-10-11 ENCOUNTER — Ambulatory Visit
Admission: RE | Admit: 2014-10-11 | Discharge: 2014-10-11 | Disposition: A | Discharge status: Home / Self Care | Payer: 59 | Source: Ambulatory Visit | Attending: Radiation Oncology | Admitting: Radiation Oncology

## 2014-10-11 DIAGNOSIS — Z510 Encounter for antineoplastic radiation therapy: Secondary | ICD-10-CM | POA: Diagnosis not present

## 2014-10-12 ENCOUNTER — Ambulatory Visit
Admission: RE | Admit: 2014-10-12 | Discharge: 2014-10-12 | Disposition: A | Discharge status: Home / Self Care | Payer: 59 | Source: Ambulatory Visit | Attending: Radiation Oncology | Admitting: Radiation Oncology

## 2014-10-12 ENCOUNTER — Ambulatory Visit: Admission: RE | Admit: 2014-10-12 | Payer: 59 | Source: Ambulatory Visit

## 2014-10-13 ENCOUNTER — Ambulatory Visit: Payer: 59

## 2014-10-13 ENCOUNTER — Ambulatory Visit
Admission: RE | Admit: 2014-10-13 | Discharge: 2014-10-13 | Disposition: A | Discharge status: Home / Self Care | Payer: 59 | Source: Ambulatory Visit | Attending: Radiation Oncology | Admitting: Radiation Oncology

## 2014-10-13 DIAGNOSIS — Z510 Encounter for antineoplastic radiation therapy: Secondary | ICD-10-CM | POA: Diagnosis not present

## 2014-10-14 ENCOUNTER — Ambulatory Visit
Admission: RE | Admit: 2014-10-14 | Discharge: 2014-10-14 | Disposition: A | Discharge status: Home / Self Care | Payer: 59 | Source: Ambulatory Visit | Attending: Radiation Oncology | Admitting: Radiation Oncology

## 2014-10-14 DIAGNOSIS — Z510 Encounter for antineoplastic radiation therapy: Secondary | ICD-10-CM | POA: Diagnosis not present

## 2014-10-17 ENCOUNTER — Ambulatory Visit
Admission: RE | Admit: 2014-10-17 | Discharge: 2014-10-17 | Disposition: A | Discharge status: Home / Self Care | Payer: 59 | Source: Ambulatory Visit | Attending: Radiation Oncology | Admitting: Radiation Oncology

## 2014-10-17 DIAGNOSIS — D0512 Intraductal carcinoma in situ of left breast: Secondary | ICD-10-CM

## 2014-10-17 DIAGNOSIS — Z510 Encounter for antineoplastic radiation therapy: Secondary | ICD-10-CM | POA: Diagnosis not present

## 2014-10-17 NOTE — Progress Notes (Signed)
Routine of clinic explained.Given Radiation Therapy and You Booklet, radiaplex and alra deodorant.Reviewed side effects to include skin discoloration, possible peeling or rash, tenderness and swelling and fatigue.Patient able to verbalize back when to apply radiaplex gel.Informed not to wear under wire bra.Continue activities with normal limits of how she feels.Patient was started on lexapro last Tuesday.States she had been crying a lot.Did state she feels a little funny in the head."It feels like my head is being squeezed"We will discuss with Dr.Wentworth tomorrow.Current dose 10 mg she questioned if she could half the dose.

## 2014-10-18 ENCOUNTER — Ambulatory Visit
Admission: RE | Admit: 2014-10-18 | Discharge: 2014-10-18 | Disposition: A | Discharge status: Home / Self Care | Payer: 59 | Source: Ambulatory Visit | Attending: Radiation Oncology | Admitting: Radiation Oncology

## 2014-10-18 ENCOUNTER — Encounter: Payer: Self-pay | Admitting: *Deleted

## 2014-10-18 VITALS — BP 127/79 | HR 80 | Temp 98.3°F | Wt 161.4 lb

## 2014-10-18 DIAGNOSIS — Z510 Encounter for antineoplastic radiation therapy: Secondary | ICD-10-CM | POA: Diagnosis not present

## 2014-10-18 DIAGNOSIS — D0512 Intraductal carcinoma in situ of left breast: Secondary | ICD-10-CM

## 2014-10-18 DIAGNOSIS — N6012 Diffuse cystic mastopathy of left breast: Secondary | ICD-10-CM

## 2014-10-18 NOTE — Progress Notes (Signed)
Weekly Management Note Current Dose: 10.68  Gy  Projected Dose: 42.72 Gy   Narrative:  The patient presents for routine under treatment assessment.  CBCT/MVCT images/Port film x-rays were reviewed.  The chart was checked. Doing well. Concerned that the lexapro is causing headaches. Started Tuesday and feels she is crying less.   Physical Findings: Weight: 161 lb 6.4 oz (73.211 kg). No skin changes  Impression:  The patient is tolerating radiation.  Plan:  Continue treatment as planned. Continue radiaplex.

## 2014-10-18 NOTE — Progress Notes (Unsigned)
Met with patient today at her weekly check with Dr. Pablo Ledger.  She states that today is not one of her better days.  She has felt some burning from the radiation and she is using the cream and it seems to be helping some.  Encouraged her to call with any needs or concerns.  She voiced understanding.

## 2014-10-19 ENCOUNTER — Ambulatory Visit
Admission: RE | Admit: 2014-10-19 | Discharge: 2014-10-19 | Disposition: A | Discharge status: Home / Self Care | Payer: 59 | Source: Ambulatory Visit | Attending: Radiation Oncology | Admitting: Radiation Oncology

## 2014-10-19 DIAGNOSIS — Z510 Encounter for antineoplastic radiation therapy: Secondary | ICD-10-CM | POA: Diagnosis not present

## 2014-10-20 ENCOUNTER — Ambulatory Visit
Admission: RE | Admit: 2014-10-20 | Discharge: 2014-10-20 | Disposition: A | Discharge status: Home / Self Care | Payer: 59 | Source: Ambulatory Visit | Attending: Radiation Oncology | Admitting: Radiation Oncology

## 2014-10-20 DIAGNOSIS — Z510 Encounter for antineoplastic radiation therapy: Secondary | ICD-10-CM | POA: Diagnosis not present

## 2014-10-21 ENCOUNTER — Ambulatory Visit
Admission: RE | Admit: 2014-10-21 | Discharge: 2014-10-21 | Disposition: A | Discharge status: Home / Self Care | Payer: 59 | Source: Ambulatory Visit | Attending: Radiation Oncology | Admitting: Radiation Oncology

## 2014-10-21 ENCOUNTER — Encounter: Payer: Self-pay | Admitting: Radiation Oncology

## 2014-10-21 DIAGNOSIS — Z510 Encounter for antineoplastic radiation therapy: Secondary | ICD-10-CM | POA: Diagnosis not present

## 2014-10-21 NOTE — Progress Notes (Signed)
4.8.16:  Rec'd [employer] paperwork from patient.  Forward to RN for processing

## 2014-10-24 ENCOUNTER — Ambulatory Visit
Admission: RE | Admit: 2014-10-24 | Discharge: 2014-10-24 | Disposition: A | Discharge status: Home / Self Care | Payer: 59 | Source: Ambulatory Visit | Attending: Radiation Oncology | Admitting: Radiation Oncology

## 2014-10-24 DIAGNOSIS — Z510 Encounter for antineoplastic radiation therapy: Secondary | ICD-10-CM | POA: Diagnosis not present

## 2014-10-25 ENCOUNTER — Ambulatory Visit
Admission: RE | Admit: 2014-10-25 | Discharge: 2014-10-25 | Disposition: A | Discharge status: Home / Self Care | Payer: 59 | Source: Ambulatory Visit | Attending: Radiation Oncology | Admitting: Radiation Oncology

## 2014-10-25 VITALS — BP 125/66 | HR 92 | Temp 97.9°F | Wt 161.7 lb

## 2014-10-25 DIAGNOSIS — Z510 Encounter for antineoplastic radiation therapy: Secondary | ICD-10-CM | POA: Diagnosis not present

## 2014-10-25 DIAGNOSIS — D0512 Intraductal carcinoma in situ of left breast: Secondary | ICD-10-CM

## 2014-10-25 NOTE — Progress Notes (Signed)
Weekly Management Note Current Dose: 24.03  Gy  Projected Dose: 70.2 Gy   Narrative:  The patient presents for routine under treatment assessment.  CBCT/MVCT images/Port film x-rays were reviewed.  The chart was checked. Doing well. No headaches since taking lexapro in the evenings.   Physical Findings: Weight: 161 lb 11.2 oz (73.347 kg). Unchanged  Impression:  The patient is tolerating radiation.  Plan:  Continue treatment as planned. Continue radiaplex.

## 2014-10-26 ENCOUNTER — Ambulatory Visit
Admission: RE | Admit: 2014-10-26 | Discharge: 2014-10-26 | Disposition: A | Discharge status: Home / Self Care | Payer: 59 | Source: Ambulatory Visit | Attending: Radiation Oncology | Admitting: Radiation Oncology

## 2014-10-26 ENCOUNTER — Encounter: Payer: Self-pay | Admitting: Radiation Oncology

## 2014-10-26 DIAGNOSIS — Z510 Encounter for antineoplastic radiation therapy: Secondary | ICD-10-CM | POA: Diagnosis not present

## 2014-10-26 NOTE — Progress Notes (Signed)
Rec;d completed [employer] cancer claim form from RN/Physician.  Copy made for scanning.  Originals placed in envelope and given to machine for patient.  Forms faxed to [place] at [phone]

## 2014-10-27 ENCOUNTER — Ambulatory Visit
Admission: RE | Admit: 2014-10-27 | Discharge: 2014-10-27 | Disposition: A | Discharge status: Home / Self Care | Payer: 59 | Source: Ambulatory Visit | Attending: Radiation Oncology | Admitting: Radiation Oncology

## 2014-10-27 DIAGNOSIS — Z510 Encounter for antineoplastic radiation therapy: Secondary | ICD-10-CM | POA: Diagnosis not present

## 2014-10-28 ENCOUNTER — Ambulatory Visit
Admission: RE | Admit: 2014-10-28 | Discharge: 2014-10-28 | Disposition: A | Discharge status: Home / Self Care | Payer: 59 | Source: Ambulatory Visit | Attending: Radiation Oncology | Admitting: Radiation Oncology

## 2014-10-28 DIAGNOSIS — Z510 Encounter for antineoplastic radiation therapy: Secondary | ICD-10-CM | POA: Diagnosis not present

## 2014-10-31 ENCOUNTER — Ambulatory Visit
Admission: RE | Admit: 2014-10-31 | Discharge: 2014-10-31 | Disposition: A | Discharge status: Home / Self Care | Payer: 59 | Source: Ambulatory Visit | Attending: Radiation Oncology | Admitting: Radiation Oncology

## 2014-10-31 DIAGNOSIS — Z510 Encounter for antineoplastic radiation therapy: Secondary | ICD-10-CM | POA: Diagnosis not present

## 2014-11-01 ENCOUNTER — Ambulatory Visit
Admission: RE | Admit: 2014-11-01 | Discharge: 2014-11-01 | Disposition: A | Discharge status: Home / Self Care | Payer: 59 | Source: Ambulatory Visit | Attending: Radiation Oncology | Admitting: Radiation Oncology

## 2014-11-01 VITALS — BP 123/89 | HR 79 | Temp 98.2°F | Wt 161.8 lb

## 2014-11-01 DIAGNOSIS — D0512 Intraductal carcinoma in situ of left breast: Secondary | ICD-10-CM | POA: Insufficient documentation

## 2014-11-01 DIAGNOSIS — Z510 Encounter for antineoplastic radiation therapy: Secondary | ICD-10-CM | POA: Diagnosis not present

## 2014-11-01 MED ORDER — BIAFINE EX EMUL
CUTANEOUS | Status: DC | PRN
  Administered 2014-11-01: 16:00:00 via TOPICAL

## 2014-11-01 NOTE — Progress Notes (Signed)
Weekly Management Note Current Dose: 37.38  Gy  Projected Dose: 52.72 Gy   Narrative:  The patient presents for routine under treatment assessment.  CBCT/MVCT images/Port film x-rays were reviewed.  The chart was checked. Doing well. No complaints. Using biafene for itching now since hydorcortisone not working.   Physical Findings: Weight: 161 lb 12.8 oz (73.392 kg). Unchanged. Dermatitis medially. Pink breast.   Impression:  The patient is tolerating radiation.  Plan:  Continue treatment as planned. Start biafene. Follow up in 1 months. Has appt with [facility].

## 2014-11-01 NOTE — Progress Notes (Signed)
Weekly assessment of radiation to right breast.Mild tanning with itching rash.Changed to biafine to apply twice daily.Given card to schedule one month follow up.Increased fatigue.

## 2014-11-02 ENCOUNTER — Ambulatory Visit: Payer: 59

## 2014-11-02 ENCOUNTER — Ambulatory Visit
Admission: RE | Admit: 2014-11-02 | Discharge: 2014-11-02 | Disposition: A | Discharge status: Home / Self Care | Payer: 59 | Source: Ambulatory Visit | Attending: Radiation Oncology | Admitting: Radiation Oncology

## 2014-11-02 DIAGNOSIS — Z510 Encounter for antineoplastic radiation therapy: Secondary | ICD-10-CM | POA: Diagnosis not present

## 2014-11-03 ENCOUNTER — Ambulatory Visit: Payer: 59

## 2014-11-03 ENCOUNTER — Ambulatory Visit
Admission: RE | Admit: 2014-11-03 | Discharge: 2014-11-03 | Disposition: A | Discharge status: Home / Self Care | Payer: 59 | Source: Ambulatory Visit | Attending: Radiation Oncology | Admitting: Radiation Oncology

## 2014-11-04 ENCOUNTER — Ambulatory Visit: Payer: 59

## 2014-11-04 ENCOUNTER — Encounter: Payer: Self-pay | Admitting: Radiation Oncology

## 2014-11-04 ENCOUNTER — Ambulatory Visit
Admission: RE | Admit: 2014-11-04 | Discharge: 2014-11-04 | Disposition: A | Discharge status: Home / Self Care | Payer: 59 | Source: Ambulatory Visit | Attending: Radiation Oncology | Admitting: Radiation Oncology

## 2014-11-04 ENCOUNTER — Encounter: Payer: Self-pay | Admitting: *Deleted

## 2014-11-04 DIAGNOSIS — Z510 Encounter for antineoplastic radiation therapy: Secondary | ICD-10-CM | POA: Diagnosis not present

## 2014-11-04 NOTE — Progress Notes (Signed)
Saw pt with final xrt treatment and ring bell. Relate she is doing well. Confirmed 1 mon f/u. Gave pt encouragement and emotional support.  Encourage pt to call with any questions or concerns.

## 2014-11-07 ENCOUNTER — Ambulatory Visit: Payer: 59

## 2014-11-07 NOTE — Progress Notes (Signed)
  Radiation Oncology         [phone]) [phone] ________________________________  Name: Janice Higgins MRN: [id number]  Date: 11/04/2014  DOB: 1956-03-17  End of Treatment Note  Diagnosis:   DCIS of the left breast  Indication for treatment:  Curative     Radiation treatment dates:   10/13/2014-11/04/2014  Site/dose:   Left breast/ 42.72 Gy at 2.67 Gy per fraction x 21 fractions.   Beams/energy:  Opposed tangents with reduced fields / 6 MV photons  Narrative: The patient tolerated radiation treatment relatively well.  She had minimal skin irritation and was able to work during treatment.   Plan: The patient has completed radiation treatment. The patient will return to radiation oncology clinic for routine followup in one month. I advised them to call or return sooner if they have any questions or concerns related to their recovery or treatment.  ------------------------------------------------  Thea Silversmith, MD

## 2014-11-08 ENCOUNTER — Ambulatory Visit: Payer: 59

## 2014-11-09 ENCOUNTER — Encounter (HOSPITAL_COMMUNITY): Payer: Self-pay | Admitting: Hematology & Oncology

## 2014-11-09 ENCOUNTER — Ambulatory Visit: Payer: 59

## 2014-11-09 ENCOUNTER — Encounter (HOSPITAL_COMMUNITY): Payer: 59 | Attending: Hematology & Oncology | Admitting: Hematology & Oncology

## 2014-11-09 VITALS — BP 125/71 | HR 80 | Temp 98.3°F | Resp 16 | Wt 164.4 lb

## 2014-11-09 DIAGNOSIS — D0512 Intraductal carcinoma in situ of left breast: Secondary | ICD-10-CM | POA: Diagnosis not present

## 2014-11-09 DIAGNOSIS — F4321 Adjustment disorder with depressed mood: Secondary | ICD-10-CM

## 2014-11-09 MED ORDER — TAMOXIFEN CITRATE 20 MG PO TABS: 20.0000 mg | ORAL_TABLET | Freq: Every day | ORAL | Status: DC

## 2014-11-09 NOTE — Progress Notes (Signed)
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 female is here for follow-up of DCIS. She completed XRT on Friday. She complains of breast tenderness and itchiness. Her mood is improved on lexapro. She notes she does not cry all the time anymore. Her appetite is good. She wishes to take Tamoxifen but wants to start it once her breast is better healed.  She notes increased fatigue from her radiation.   MEDICAL HISTORY:  Past Medical History  Diagnosis Date  . Osteoarthritis     hands and feet  . History of kidney stones   . 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 History of bronchitis     SURGICAL HISTORY: Past Surgical History  Procedure Laterality Date  . Foot surgery Right 1995    exc. bone spur  . Tonsillectomy    . Tubal ligation    . Lasik    . Breast lumpectomy with radioactive seed localization Left 08/12/2014    Procedure:  RADIOACTIVE SEED LOCALIZATION LEFT BREAST LUMPECTOMY;  Surgeon: Excell Seltzer, MD;  Location: [place];  Service: General;  Laterality: Left;  .  Re-excision of breast lumpectomy Left 08/22/2014    Procedure: RE-EXCISION OF LEFT BREAST LUMPECTOMY;  Surgeon: Excell Seltzer, MD;  Location: [facility];  Service: General;  Laterality: Left;  . Re-excision of breast lumpectomy Left 09/05/2014    Procedure: RE-EXCISION OF BREAST LUMPECTOMY;  Surgeon: Excell Seltzer, MD;  Location: [facility];  Service: General;  Laterality: Left;    SOCIAL HISTORY: History   Social History  . Marital Status: Married    Spouse Name: N/A  . Number of Children: N/A  . Years of Education: N/A   Occupational History  . Not on file.   Social History Main Topics  . Smoking status: Never Smoker   . Smokeless tobacco: Never Used  . Alcohol Use: No  . Drug Use: No  . Sexual Activity: Yes    Birth Control/ Protection: Post-menopausal   Other Topics Concern  . Not on file   Social History Narrative   she is married for 36 years. She was born and [place]. She has worked at [employer] for greater than 30 years.  FAMILY HISTORY: Family History  Problem Relation Age of Onset  . Hypertension Father   . Hypertension Mother   . Heart disease Mother 11   indicated that her mother is deceased. She indicated that her father is alive.   Her mother died 77 years ago at the age of 74 from an MI. She had asthma. Her father is alive at 75 with a new diagnosis of hypertension and thyroid problems. He is very independent. She had  one brother that died at the age of 23 months from "water head". She has a sister who she states has psychiatric illness. Her nephew had the same disease as her brother and died at the age of 32.  ALLERGIES:  is allergic to sulfa antibiotics.  MEDICATIONS:  Current Outpatient Prescriptions  Medication Sig Dispense Refill  . acetaminophen (TYLENOL) 325 MG tablet Take 650 mg by mouth every 6 (six) hours as needed.    . diclofenac (VOLTAREN) 75 MG EC tablet TAKE 1 TABLET BY MOUTH TWICE DAILY WITH MEALS 60 tablet 5  .  Doxylamine Succinate, Sleep, (SLEEP AID PO) Take 1 tablet by mouth at bedtime as needed.    Marland Kitchen emollient (BIAFINE) cream Apply topically as needed.    Marland Kitchen escitalopram (LEXAPRO) 10 MG tablet Take 1 tablet (10 mg total) by mouth daily. 30 tablet 2  . albuterol (PROVENTIL HFA;VENTOLIN HFA) 108 (90 BASE) MCG/ACT inhaler Inhale 2 puffs into the lungs every 4 (four) hours as needed for wheezing. (Patient not taking: Reported on 09/28/2014) 1 Inhaler 5  . HYDROcodone-acetaminophen (NORCO/VICODIN) 5-325 MG per tablet Take 1-2 tablets by mouth every 4 (four) hours as needed for moderate pain or severe pain. (Patient not taking: Reported on 10/18/2014) 20 tablet 0  . non-metallic deodorant (ALRA) MISC Apply 1 application topically daily as needed.    . tamoxifen (NOLVADEX) 20 MG tablet Take 1 tablet (20 mg total) by mouth daily. 30 tablet 2   No current facility-administered medications for this visit.    Review of Systems  Constitutional: Positive for malaise/fatigue. Negative for fever, chills and weight loss.  HENT: Negative for congestion, hearing loss, nosebleeds, sore throat and tinnitus.   Eyes: Negative for blurred vision, double vision, pain and discharge.  Respiratory: Negative for cough, hemoptysis, sputum production, shortness of breath and wheezing.   Cardiovascular: Negative for chest pain, palpitations, claudication, leg swelling and PND.  Gastrointestinal: Negative for heartburn, nausea, vomiting, abdominal pain, diarrhea, constipation, blood in stool and melena.  Genitourinary: Negative for dysuria, urgency, frequency and hematuria.  Musculoskeletal: Negative for myalgias, joint pain and falls.  Skin: Negative for itching and rash.  Neurological: Negative for dizziness, tingling, tremors, sensory change, speech change, focal weakness, seizures, loss of consciousness, weakness and headaches.  Endo/Heme/Allergies: Does not bruise/bleed easily.  Psychiatric/Behavioral: Negative for depression,  suicidal ideas, memory loss and substance abuse. The patient is not nervous/anxious and does not have insomnia.     PHYSICAL EXAMINATION: ECOG PERFORMANCE STATUS: 0 - Asymptomatic  Filed Vitals:   11/09/14 1148  BP: 125/71  Pulse: 80  Temp: 98.3 F (36.8 C)  Resp: 16   Filed Weights   11/09/14 1148  Weight: 164 lb 6.4 oz (74.571 kg)     Physical Exam  Constitutional: She is oriented to person, place, and time and well-developed, well-nourished, and in no distress.  HENT:  Head: Normocephalic and atraumatic.  Nose: Nose normal.  Mouth/Throat: Oropharynx is clear and moist. No oropharyngeal exudate.  Eyes: Conjunctivae and EOM are normal. Pupils are equal, round, and reactive to light. Right eye exhibits no discharge. Left eye exhibits no discharge. No scleral icterus.  Neck: Normal range of motion. Neck supple. No tracheal deviation present. No thyromegaly present.  Cardiovascular: Normal rate, regular rhythm and normal heart sounds.  Exam reveals no gallop and no friction rub.   No murmur heard. Pulmonary/Chest: Effort normal and breath sounds normal. She has no wheezes. She has no rales.  Left Breast with XRT changes, erythema  and mild desquamation under the breast.  Abdominal: Soft. Bowel sounds are normal. She exhibits no distension and no mass. There is no tenderness. There is no rebound and no guarding.  Musculoskeletal: Normal range of motion. She exhibits no edema.  Lymphadenopathy:    She has no cervical adenopathy.  Neurological: She is alert and oriented to person, place, and time. She has normal reflexes. No cranial nerve deficit. Gait normal. Coordination normal.  Skin: Skin is warm and dry. No rash noted.  Psychiatric: Mood, memory, affect and judgment normal.  Nursing note and vitals reviewed.    LABORATORY DATA:  I have reviewed the data as listed Lab Results  Component Value Date   WBC 7.8 08/19/2014   HGB 12.4 09/05/2014   HCT 42.9 08/19/2014   MCV  91.9 08/19/2014   PLT 294 08/19/2014     Chemistry      Component Value Date/Time   NA 138 08/19/2014 1615   K 3.6 08/19/2014 1615   CL 104 08/19/2014 1615   CO2 27 08/19/2014 1615   BUN 9 08/19/2014 1615   CREATININE 0.65 08/19/2014 1615   CREATININE 0.58 12/13/2013 0801      Component Value Date/Time   CALCIUM 10.2 08/19/2014 1615   ALKPHOS 45 12/13/2013 0801   AST 25 12/13/2013 0801   ALT 33 12/13/2013 0801   BILITOT 0.6 12/13/2013 0801       ASSESSMENT & PLAN:   DCIS left breast 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 She has completed definitive surgical therapy and radiation. I called her in some nystatin powder to use under the left breast. I have advised her to continue using the prescription Dr. Pablo Ledger has given her.  We again reviewed the risks and benefits of Tamoxifen therapy.  I have called in a prescription for 20mg  Tamoxifen which she will begin in the next several weeks once her fatigue has improved.  i will see her back in 2 months to review her tolerance.  She was advised to call in the interim with any problems or concerns. All questions were answered.   She is to continue with her Lexapro.  This note was electronically signed.    Molli Hazard, MD  11/09/2014 7:50 PM

## 2014-11-09 NOTE — Patient Instructions (Addendum)
[place] at [facility] Discharge Instructions  RECOMMENDATIONS MADE BY THE CONSULTANT AND ANY TEST RESULTS WILL BE SENT TO YOUR REFERRING PHYSICIAN.  Exam and discussion by Dr. Whitney Muse.  Tamoxifen 20 mg daily - start in a couple of weeks Nyastatin Powder - apply as directed Report any new lumps, bone pain, shortness of breath or other symptoms.  Follow-up in 2 months with labs and office visit.   Thank you for choosing [place] at [facility] to provide your oncology and hematology care.  To afford each patient quality time with our provider, please arrive at least 15 minutes before your scheduled appointment time.    You need to re-schedule your appointment should you arrive 10 or more minutes late.  We strive to give you quality time with our providers, and arriving late affects you and other patients whose appointments are after yours.  Also, if you no show three or more times for appointments you may be dismissed from the clinic at the providers discretion.     Again, thank you for choosing [facility].  Our hope is that these requests will decrease the amount of time that you wait before being seen by our physicians.       _____________________________________________________________  Should you have questions after your visit to [facility], please contact our office at [phone] between the hours of 8:30 a.m. and 4:30 p.m.  Voicemails left after 4:30 p.m. will not be returned until the following business day.  For prescription refill requests, have your pharmacy contact our office.    Tamoxifen oral tablet What is this medicine? TAMOXIFEN (ta MOX i fen) blocks the effects of estrogen. It is commonly used to treat breast cancer. It is also used to decrease the chance of breast cancer coming back in women who have received treatment for the disease. It may also help prevent breast cancer in women who have a  high risk of developing breast cancer. This medicine may be used for other purposes; ask your health care provider or pharmacist if you have questions. COMMON BRAND NAME(S): Nolvadex What should I tell my health care provider before I take this medicine? They need to know if you have any of these conditions: -blood clots -blood disease -cataracts or impaired eyesight -endometriosis -high calcium levels -high cholesterol -irregular menstrual cycles -liver disease -stroke -uterine fibroids -an unusual or allergic reaction to tamoxifen, other medicines, foods, dyes, or preservatives -pregnant or trying to get pregnant -breast-feeding How should I use this medicine? Take this medicine by mouth with a glass of water. Follow the directions on the prescription label. You can take it with or without food. Take your medicine at regular intervals. Do not take your medicine more often than directed. Do not stop taking except on your doctor's advice. A special MedGuide will be given to you by the pharmacist with each prescription and refill. Be sure to read this information carefully each time. Talk to your pediatrician regarding the use of this medicine in children. While this drug may be prescribed for selected conditions, precautions do apply. Overdosage: If you think you have taken too much of this medicine contact a poison control center or emergency room at once. NOTE: This medicine is only for you. Do not share this medicine with others. What if I miss a dose? If you miss a dose, take it as soon as you can. If it is almost time for your  next dose, take only that dose. Do not take double or extra doses. What may interact with this medicine? -aminoglutethimide -bromocriptine -chemotherapy drugs -female hormones, like estrogens and birth control pills -letrozole -medroxyprogesterone -phenobarbital -rifampin -warfarin This list may not describe all possible interactions. Give your health  care provider a list of all the medicines, herbs, non-prescription drugs, or dietary supplements you use. Also tell them if you smoke, drink alcohol, or use illegal drugs. Some items may interact with your medicine. What should I watch for while using this medicine? Visit your doctor or health care professional for regular checks on your progress. You will need regular pelvic exams, breast exams, and mammograms. If you are taking this medicine to reduce your risk of getting breast cancer, you should know that this medicine does not prevent all types of breast cancer. If breast cancer or other problems occur, there is no guarantee that it will be found at an early stage. Do not become pregnant while taking this medicine or for 2 months after stopping this medicine. Stop taking this medicine if you get pregnant or think you are pregnant and contact your doctor. This medicine may harm your unborn baby. Women who can possibly become pregnant should use birth control methods that do not use hormones during tamoxifen treatment and for 2 months after therapy has stopped. Talk with your health care provider for birth control advice. Do not breast feed while taking this medicine. What side effects may I notice from receiving this medicine? Side effects that you should report to your doctor or health care professional as soon as possible: -changes in vision (blurred vision) -changes in your menstrual cycle -difficulty breathing or shortness of breath -difficulty walking or talking -new breast lumps -numbness -pelvic pain or pressure -redness, blistering, peeling or loosening of the skin, including inside the mouth -skin rash or itching (hives) -sudden chest pain -swelling of lips, face, or tongue -swelling, pain or tenderness in your calf or leg -unusual bruising or bleeding -vaginal discharge that is bloody, brown, or rust -weakness -yellowing of the whites of the eyes or skin Side effects that usually  do not require medical attention (report to your doctor or health care professional if they continue or are bothersome): -fatigue -hair loss, although uncommon and is usually mild -headache -hot flashes -impotence (in men) -nausea, vomiting (mild) -vaginal discharge (white or clear) This list may not describe all possible side effects. Call your doctor for medical advice about side effects. You may report side effects to FDA at 1-800-FDA-1088. Where should I keep my medicine? Keep out of the reach of children. Store at room temperature between 20 and 25 degrees C (68 and 77 degrees F). Protect from light. Keep container tightly closed. Throw away any unused medicine after the expiration date. NOTE: This sheet is a summary. It may not cover all possible information. If you have questions about this medicine, talk to your doctor, pharmacist, or health care provider.  2015, Elsevier/Gold Standard. (2008-03-17 12:01:56)

## 2014-11-10 ENCOUNTER — Ambulatory Visit: Payer: 59

## 2014-11-11 ENCOUNTER — Ambulatory Visit: Payer: 59

## 2014-11-14 ENCOUNTER — Ambulatory Visit: Payer: 59

## 2014-11-15 ENCOUNTER — Telehealth: Payer: Self-pay | Admitting: Adult Health

## 2014-11-15 ENCOUNTER — Ambulatory Visit: Payer: 59

## 2014-11-15 NOTE — Telephone Encounter (Signed)
Attempted to reach Ms. Riles to schedule her [facility] appointment now that she has completed her anti-cancer treatment. Unable to leave a voicemail at home or on her cell phone.  Will try again at another time.   Mike Craze, NP [place] [phone]

## 2014-11-30 NOTE — Progress Notes (Signed)
   Department of Radiation Oncology  Phone:  [phone] Fax:        [phone]   Name: Janice Higgins MRN: [id number]  DOB: 1955-09-24  Date: 12/01/2014  Follow Up Visit Note  Diagnosis: DCIS of the left breast  Summary and Interval since last radiation: 11/04/2014 Site/dose: Left breast/ 42.72 Gy at 2.67 Gy per fraction x 21 fractions.   Interval History: Janice Higgins presents today for routine followup. She continues to use lotion with vitamin e. She has new left leg pain that is relieved with hydrocodone. She states that Dr.Penland will order a MRI if the pain continues. Started tamoxifen at the end of April. The pt complains of some hot flashes, she has about 4 to 5 a day, but for the last 2 nights she has had night sweats. She also noted skin pealing on the left breast which has resolved. She is pleased with the cosmetic results. She has a follow up with Dr. Whitney Muse in 1 month.  Physical Exam:  There were no vitals filed for this visit. Completely healed left breast skin. Well healed incision. Excellent cosmetic result.  IMPRESSION: Janice Higgins is a 58 y.o. female with DCIS of the left breast with resolving acute effects of treatment.  PLAN:  She is doing well. We discussed the need for follow up every 4-6 months which she has scheduled. We discussed the need for yearly mammograms which she can schedule with her OBGYN or with medical oncology. We discussed the need for sun protection in the treated area. She can always call me with questions. I will follow up with her on an as needed basis.   This document serves as a record of services personally performed by Thea Silversmith, MD. It was created on her behalf by Darcus Austin, a trained medical scribe. The creation of this record is based on the scribe's personal observations and the provider's statements to them. This document has been checked and approved by the attending provider.   Thea Silversmith, MD

## 2014-12-01 ENCOUNTER — Ambulatory Visit
Admission: RE | Admit: 2014-12-01 | Discharge: 2014-12-01 | Disposition: A | Discharge status: Home / Self Care | Payer: 59 | Source: Ambulatory Visit | Attending: Radiation Oncology | Admitting: Radiation Oncology

## 2014-12-01 DIAGNOSIS — D0512 Intraductal carcinoma in situ of left breast: Secondary | ICD-10-CM

## 2014-12-01 NOTE — Progress Notes (Signed)
One month follow up completion of radiation to left breast on 11/04/14.Breast has healed nicely.Continue lotion with vitamin e.New left leg pain relieved with hydrocodone.State Dr.Penland will order mri if pain continues.Started tamoxifen end of April.Has some hot flashes.

## 2014-12-08 ENCOUNTER — Telehealth (HOSPITAL_COMMUNITY): Payer: Self-pay | Admitting: *Deleted

## 2014-12-29 ENCOUNTER — Other Ambulatory Visit (HOSPITAL_COMMUNITY): Payer: Self-pay

## 2014-12-30 ENCOUNTER — Other Ambulatory Visit (HOSPITAL_COMMUNITY): Payer: Self-pay

## 2014-12-30 DIAGNOSIS — D0512 Intraductal carcinoma in situ of left breast: Secondary | ICD-10-CM

## 2014-12-30 DIAGNOSIS — N6012 Diffuse cystic mastopathy of left breast: Secondary | ICD-10-CM

## 2015-01-09 ENCOUNTER — Encounter (HOSPITAL_BASED_OUTPATIENT_CLINIC_OR_DEPARTMENT_OTHER): Payer: 59

## 2015-01-09 ENCOUNTER — Encounter (HOSPITAL_COMMUNITY): Payer: Self-pay | Admitting: Hematology & Oncology

## 2015-01-09 ENCOUNTER — Encounter (HOSPITAL_COMMUNITY): Payer: 59 | Attending: Hematology & Oncology | Admitting: Hematology & Oncology

## 2015-01-09 VITALS — BP 117/69 | HR 83 | Temp 98.3°F | Resp 18 | Wt 169.6 lb

## 2015-01-09 DIAGNOSIS — N6012 Diffuse cystic mastopathy of left breast: Secondary | ICD-10-CM

## 2015-01-09 DIAGNOSIS — N898 Other specified noninflammatory disorders of vagina: Secondary | ICD-10-CM | POA: Diagnosis not present

## 2015-01-09 DIAGNOSIS — D0512 Intraductal carcinoma in situ of left breast: Secondary | ICD-10-CM | POA: Insufficient documentation

## 2015-01-09 DIAGNOSIS — D0510 Intraductal carcinoma in situ of unspecified breast: Secondary | ICD-10-CM

## 2015-01-09 DIAGNOSIS — N939 Abnormal uterine and vaginal bleeding, unspecified: Secondary | ICD-10-CM

## 2015-01-09 DIAGNOSIS — D242 Benign neoplasm of left breast: Secondary | ICD-10-CM | POA: Diagnosis present

## 2015-01-09 LAB — COMPREHENSIVE METABOLIC PANEL
ALBUMIN: 4 g/dL (ref 3.5–5.0)
ALK PHOS: 46 U/L (ref 38–126)
ALT: 26 U/L (ref 14–54)
ANION GAP: 8 (ref 5–15)
AST: 22 U/L (ref 15–41)
BILIRUBIN TOTAL: 0.4 mg/dL (ref 0.3–1.2)
BUN: 17 mg/dL (ref 6–20)
CO2: 27 mmol/L (ref 22–32)
Calcium: 9.3 mg/dL (ref 8.9–10.3)
Chloride: 107 mmol/L (ref 101–111)
Creatinine, Ser: 0.7 mg/dL (ref 0.44–1.00)
Glucose, Bld: 89 mg/dL (ref 65–99)
POTASSIUM: 3.6 mmol/L (ref 3.5–5.1)
SODIUM: 142 mmol/L (ref 135–145)
Total Protein: 6.8 g/dL (ref 6.5–8.1)

## 2015-01-09 LAB — CBC
HCT: 40.3 % (ref 36.0–46.0)
Hemoglobin: 13 g/dL (ref 12.0–15.0)
MCH: 30 pg (ref 26.0–34.0)
MCHC: 32.3 g/dL (ref 30.0–36.0)
MCV: 93.1 fL (ref 78.0–100.0)
PLATELETS: 289 10*3/uL (ref 150–400)
RBC: 4.33 MIL/uL (ref 3.87–5.11)
RDW: 13.1 % (ref 11.5–15.5)
WBC: 7.2 10*3/uL (ref 4.0–10.5)

## 2015-01-09 NOTE — Progress Notes (Signed)
LABS DRAWN

## 2015-01-09 NOTE — Patient Instructions (Signed)
[place] at [facility] Discharge Instructions  RECOMMENDATIONS MADE BY THE CONSULTANT AND ANY TEST RESULTS WILL BE SENT TO YOUR REFERRING PHYSICIAN.  Return in 3 months for follow up. Report any issues/concerns as needed prior to appointment.  Thank you for choosing [place] at [facility] to provide your oncology and hematology care.  To afford each patient quality time with our provider, please arrive at least 15 minutes before your scheduled appointment time.    You need to re-schedule your appointment should you arrive 10 or more minutes late.  We strive to give you quality time with our providers, and arriving late affects you and other patients whose appointments are after yours.  Also, if you no show three or more times for appointments you may be dismissed from the clinic at the providers discretion.     Again, thank you for choosing [facility].  Our hope is that these requests will decrease the amount of time that you wait before being seen by our physicians.       _____________________________________________________________  Should you have questions after your visit to [facility], please contact our office at [phone] between the hours of 8:30 a.m. and 4:30 p.m.  Voicemails left after 4:30 p.m. will not be returned until the following business day.  For prescription refill requests, have your pharmacy contact our office.

## 2015-01-09 NOTE — Progress Notes (Signed)
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 female is here for follow-up of DCIS. She completed XRT.  Her appetite is good.  She is present alone today and has complaints of vaginal bleeding and discharge. She notes she had two episodes over the weekend. She attributed this to "stopping her tamoxifen." She also had a yeast infection with severe itching. Alleviated with Monistat. The patient denies any burning during urination.  The patient dislikes taking Tomoxifen and has stopped taking it after a month and a half; she says that it makes her feel awful. She reported fatigue, leg pain and "generalized discomfort."  She states that she does have weight gain  Her mood is much better, she has not been crying.   MEDICAL HISTORY:  Past Medical History  Diagnosis Date  . Osteoarthritis     hands and feet  . History of kidney stones   . 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 History of bronchitis   . Radiation 10/13/14-11/04/14    Left breast    SURGICAL HISTORY: Past Surgical History  Procedure  Laterality Date  . Foot surgery Right 1995    exc. bone spur  . Tonsillectomy    . Tubal ligation    . Lasik    . Breast lumpectomy with radioactive seed localization Left 08/12/2014    Procedure:  RADIOACTIVE SEED LOCALIZATION LEFT BREAST LUMPECTOMY;  Surgeon: Excell Seltzer, MD;  Location: [facility];  Service: General;  Laterality: Left;  . Re-excision of breast lumpectomy Left 08/22/2014    Procedure: RE-EXCISION OF LEFT BREAST LUMPECTOMY;  Surgeon: Excell Seltzer, MD;  Location: [facility];  Service: General;  Laterality: Left;  . Re-excision of breast lumpectomy Left 09/05/2014    Procedure: RE-EXCISION OF BREAST LUMPECTOMY;  Surgeon: Excell Seltzer, MD;  Location: [facility];  Service: General;  Laterality: Left;    SOCIAL HISTORY: History   Social History  . Marital Status: Married    Spouse Name: N/A  . Number of Children: N/A  . Years of Education: N/A   Occupational History  . Not on file.   Social History Main Topics  . Smoking status: Never Smoker   . Smokeless tobacco: Never Used  . Alcohol Use: No  . Drug Use: No  . Sexual Activity: Yes    Birth Control/ Protection: Post-menopausal   Other Topics Concern  . Not on file   Social History Narrative   she is married for 36 years. She was born and [place]. She has worked at [employer] for greater than 30 years.  FAMILY HISTORY: Family History  Problem Relation Age of Onset  . Hypertension Father   . Hypertension  Mother   . Heart disease Mother 3   indicated that her mother is deceased. She indicated that her father is alive.   Her mother died 38 years ago at the age of 44 from an MI. She had asthma. Her father is alive at 95 with a new diagnosis of hypertension and thyroid problems. He is very independent. She had one brother that died at the age of 4 months from "water head". She has a sister who she states has psychiatric illness. Her nephew had the same disease as her  brother and died at the age of 6.  ALLERGIES:  is allergic to sulfa antibiotics.  MEDICATIONS:  Current Outpatient Prescriptions  Medication Sig Dispense Refill  . acetaminophen (TYLENOL) 325 MG tablet Take 650 mg by mouth every 6 (six) hours as needed.    . diclofenac (VOLTAREN) 75 MG EC tablet TAKE 1 TABLET BY MOUTH TWICE DAILY WITH MEALS 60 tablet 5  . Doxylamine Succinate, Sleep, (SLEEP AID PO) Take 1 tablet by mouth at bedtime as needed.    Marland Kitchen albuterol (PROVENTIL HFA;VENTOLIN HFA) 108 (90 BASE) MCG/ACT inhaler Inhale 2 puffs into the lungs every 4 (four) hours as needed for wheezing. (Patient not taking: Reported on 09/28/2014) 1 Inhaler 5  . escitalopram (LEXAPRO) 10 MG tablet Take 1 tablet (10 mg total) by mouth daily. (Patient not taking: Reported on 01/09/2015) 30 tablet 2  . HYDROcodone-acetaminophen (NORCO/VICODIN) 5-325 MG per tablet Take 1-2 tablets by mouth every 4 (four) hours as needed for moderate pain or severe pain. (Patient not taking: Reported on 10/18/2014) 20 tablet 0  . tamoxifen (NOLVADEX) 20 MG tablet Take 1 tablet (20 mg total) by mouth daily. (Patient not taking: Reported on 01/09/2015) 30 tablet 2   No current facility-administered medications for this visit.    Review of Systems  Constitutional: Positive for malaise/fatigue. Negative for fever, chills and weight loss.  HENT: Negative for congestion, hearing loss, nosebleeds, sore throat and tinnitus.   Eyes: Negative for blurred vision, double vision, pain and discharge.  Respiratory: Negative for cough, hemoptysis, sputum production, shortness of breath and wheezing.   Cardiovascular: Negative for chest pain, palpitations, claudication, leg swelling and PND.  Gastrointestinal: Negative for heartburn, nausea, vomiting, abdominal pain, diarrhea, constipation, blood in stool and melena.  Genitourinary: Negative for dysuria, urgency, frequency and hematuria.  Musculoskeletal: Negative for myalgias, joint pain and  falls.  Skin: Negative for itching and rash.  Neurological: Negative for dizziness, tingling, tremors, sensory change, speech change, focal weakness, seizures, loss of consciousness, weakness and headaches.  Endo/Heme/Allergies: Does not bruise/bleed easily.  Psychiatric/Behavioral: Negative for depression, suicidal ideas, memory loss and substance abuse. The patient is not nervous/anxious and does not have insomnia.   14 point review of systems was performed and is negative except as detailed under history of present illness and above   PHYSICAL EXAMINATION: ECOG PERFORMANCE STATUS: 0 - Asymptomatic  Filed Vitals:   01/09/15 1300  BP: 117/69  Pulse: 83  Temp: 98.3 F (36.8 C)  Resp: 18   Filed Weights   01/09/15 1300  Weight: 169 lb 9.6 oz (76.93 kg)    Physical Exam  Constitutional: She is oriented to person, place, and time and well-developed, well-nourished, and in no distress.  HENT:  Head: Normocephalic and atraumatic.  Nose: Nose normal.  Mouth/Throat: Oropharynx is clear and moist. No oropharyngeal exudate.  Eyes: Conjunctivae and EOM are normal. Pupils are equal, round, and reactive to light. Right eye exhibits no discharge. Left  eye exhibits no discharge. No scleral icterus.  Neck: Normal range of motion. Neck supple. No tracheal deviation present. No thyromegaly present.  Cardiovascular: Normal rate, regular rhythm and normal heart sounds.  Exam reveals no gallop and no friction rub.   No murmur heard. Pulmonary/Chest: Effort normal and breath sounds normal. She has no wheezes. She has no rales.  Abdominal: Soft. Bowel sounds are normal. She exhibits no distension and no mass. There is no tenderness. There is no rebound and no guarding.  Musculoskeletal: Normal range of motion. She exhibits no edema.  Lymphadenopathy:    She has no cervical adenopathy.  Neurological: She is alert and oriented to person, place, and time. She has normal reflexes. No cranial nerve  deficit. Gait normal. Coordination normal.  Skin: Skin is warm and dry. No rash noted.  Psychiatric: Mood, memory, affect and judgment normal.  Nursing note and vitals reviewed.    LABORATORY DATA:  I have reviewed the data as listed Lab Results  Component Value Date   WBC 7.2 01/09/2015   HGB 13.0 01/09/2015   HCT 40.3 01/09/2015   MCV 93.1 01/09/2015   PLT 289 01/09/2015     Chemistry      Component Value Date/Time   NA 142 01/09/2015 1234   K 3.6 01/09/2015 1234   CL 107 01/09/2015 1234   CO2 27 01/09/2015 1234   BUN 17 01/09/2015 1234   CREATININE 0.70 01/09/2015 1234   CREATININE 0.58 12/13/2013 0801      Component Value Date/Time   CALCIUM 9.3 01/09/2015 1234   ALKPHOS 46 01/09/2015 1234   AST 22 01/09/2015 1234   ALT 26 01/09/2015 1234   BILITOT 0.4 01/09/2015 1234       ASSESSMENT & PLAN:   DCIS left breast 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 She has completed definitive surgical therapy and radiation. He is very adamant today that she cannot take tamoxifen. She has stated that she does not wish to retry it. She feels she absolutely had no quality on it. She also states she feels that 6 weeks was a  good enough trial of the medication. She does not wish to try an AI.  I have advised her she must f/u with her gynecologist for evaluation of her vaginal spotting. She agrees to call and set up the appointment asap.  I offered to schedule this for her today but she states she will be sure to follow-up.  Follow up, 3 months, breast exam, we will also schedule her mammogram.  This note was electronically signed.   This document serves as a record of services personally performed by Ancil Linsey, MD. It was created on her behalf by Janace Hoard, a trained medical scribe. The creation of this record is based on the scribe's personal observations and the provider's statements to them. This document has been  checked and approved by the attending provider.  I have reviewed the above documentation for accuracy and completeness, and I agree with the above.  Shanon K. Whitney Muse, MD

## 2015-01-30 ENCOUNTER — Other Ambulatory Visit (HOSPITAL_COMMUNITY): Payer: Self-pay | Admitting: Oncology

## 2015-01-30 ENCOUNTER — Other Ambulatory Visit: Payer: Self-pay | Admitting: Family Medicine

## 2015-01-30 NOTE — Telephone Encounter (Signed)
This and 3 rf, follow up dr Richardson Landry in the fall

## 2015-01-31 NOTE — Telephone Encounter (Signed)
Needs office visit.

## 2015-02-13 ENCOUNTER — Encounter (HOSPITAL_COMMUNITY): Payer: 59 | Admitting: Genetic Counselor

## 2015-03-15 ENCOUNTER — Telehealth: Payer: Self-pay | Admitting: Adult Health

## 2015-03-15 NOTE — Telephone Encounter (Signed)
I left Ms. Battisti a Advertising account executive and asked that she return my call so that we may discuss her options for survivorship now that she has completed treatment for breast cancer.  I left my direct office number to return my call.  If we do not receive a return call from Ms. Sarra in 1 week, we will plan on mailing her a copy of her survivorship care plan, along with a letter to contact us with any questions or concerns.   Mike Craze, NP [place] [phone]

## 2015-03-23 ENCOUNTER — Encounter (INDEPENDENT_AMBULATORY_CARE_PROVIDER_SITE_OTHER): Payer: Self-pay | Admitting: *Deleted

## 2015-03-23 ENCOUNTER — Other Ambulatory Visit (INDEPENDENT_AMBULATORY_CARE_PROVIDER_SITE_OTHER): Payer: Self-pay | Admitting: Internal Medicine

## 2015-03-23 ENCOUNTER — Telehealth (INDEPENDENT_AMBULATORY_CARE_PROVIDER_SITE_OTHER): Payer: Self-pay | Admitting: *Deleted

## 2015-03-23 DIAGNOSIS — Z1211 Encounter for screening for malignant neoplasm of colon: Secondary | ICD-10-CM

## 2015-03-23 MED ORDER — SUPREP BOWEL PREP KIT 17.5-3.13-1.6 GM/177ML PO SOLN: 1.0000 | Freq: Once | ORAL | Status: DC

## 2015-03-23 NOTE — Telephone Encounter (Signed)
Patient needs suprep 

## 2015-04-04 ENCOUNTER — Encounter: Payer: Self-pay | Admitting: Nurse Practitioner

## 2015-04-04 ENCOUNTER — Telehealth (INDEPENDENT_AMBULATORY_CARE_PROVIDER_SITE_OTHER): Payer: Self-pay | Admitting: *Deleted

## 2015-04-04 NOTE — Telephone Encounter (Signed)
agree

## 2015-04-04 NOTE — Telephone Encounter (Signed)
Referring MD/PCP: steve luking   Procedure: tcs  Reason/Indication:  screening  Has patient had this procedure before?  no  If so, when, by whom and where?    Is there a family history of colon cancer?  no  Who?  What age when diagnosed?    Is patient diabetic?   no      Does patient have prosthetic heart valve?  no  Do you have a pacemaker?  no  Has patient ever had endocarditis? no  Has patient had joint replacement within last 12 months?  no  Does patient tend to be constipated or take laxatives? no  Does patient have a history of alcohol/drug use? no  Is patient on Coumadin, Plavix and/or Aspirin? no  Medications: see epic  Allergies: see epic  Medication Adjustment:   Procedure date & time: 05/04/15 at 730

## 2015-04-04 NOTE — Progress Notes (Signed)
The Survivorship Care Plan was mailed to [name] as she reported not being able to come in to the Survivorship Clinic for an in-person visit at this time. A letter was mailed to her outlining the purpose of the content of the care plan, as well as encouraging her to reach out to me with any questions or concerns.  My business card was included in the correspondence to the patient as well.  A copy of the care plan was also routed/faxed/mailed to Mickie Hillier, MD, the patient's PCP.  I will not be placing any follow-up appointments to the Survivorship Clinic for Janice Higgins, but I am happy to see her at any time in the future for any survivorship concerns that may arise. Thank you for allowing me to participate in her care!  Kenn File, [place] [phone]

## 2015-04-06 ENCOUNTER — Encounter (HOSPITAL_COMMUNITY): Payer: Self-pay | Admitting: Genetic Counselor

## 2015-04-06 ENCOUNTER — Encounter (HOSPITAL_COMMUNITY): Payer: 59 | Attending: Genetic Counselor | Admitting: Genetic Counselor

## 2015-04-06 DIAGNOSIS — Z315 Encounter for genetic counseling: Secondary | ICD-10-CM

## 2015-04-06 DIAGNOSIS — D0512 Intraductal carcinoma in situ of left breast: Secondary | ICD-10-CM | POA: Diagnosis not present

## 2015-04-06 DIAGNOSIS — C50919 Malignant neoplasm of unspecified site of unspecified female breast: Secondary | ICD-10-CM | POA: Insufficient documentation

## 2015-04-06 DIAGNOSIS — Z803 Family history of malignant neoplasm of breast: Secondary | ICD-10-CM | POA: Diagnosis not present

## 2015-04-06 DIAGNOSIS — D0510 Intraductal carcinoma in situ of unspecified breast: Secondary | ICD-10-CM

## 2015-04-06 NOTE — Progress Notes (Signed)
REFERRING PROVIDER: Mikey Kirschner, MD [place],  [address]   Ancil Linsey, MD  PRIMARY PROVIDER:  Mickie Hillier, MD  PRIMARY REASON FOR VISIT:  1. DCIS (ductal carcinoma in situ), unspecified laterality   2. Family history of breast cancer      HISTORY OF PRESENT ILLNESS:   Janice Higgins, a 59 y.o. female, was seen for a [facility] cancer genetics consultation at the request of Dr. Wolfgang Phoenix due to a personal and family history of cancer.  Janice Higgins presents to clinic today to discuss the possibility of a hereditary predisposition to cancer, genetic testing, and to further clarify her future cancer risks, as well as potential cancer risks for family members.   In 2015, at the age of 58, Janice Higgins was diagnosed with DCIS of the lef breast. This was treated with lumpectomy and radiation.  She took tamoxifen for several months but could not tolerate it well.    CANCER HISTORY:    Breast cancer, left breast   05/30/2014 Mammogram Left breast: possible asymmetry warranting urther evaluation    06/21/2014 Breast US Left breast: small, slightly irregular, oval hypoechoic mass located at 3:30 position, 4 cm from the nipple, measuring 5 x 5 x 4 mm in size.   06/28/2014 Initial Biopsy Left breast needle core bx: Ductal papilloma   06/28/2014 Clinical Stage Stage 0: Tis N0   08/12/2014 Definitive Surgery Left lumpectomy ([facility]): DCIS, intermediate grade, 0.9 cm, ER+ (99%), PR+ (22%), focally less than 0.1 cm to the inferior margin   08/12/2014 Pathologic Stage Stage 0: pTis pNx   08/22/2014 Surgery Excision of additional left inferior margin ([facility]): DCIS, intermediate frade, focally less than 0.1 cm to the nearest new inferior margin   09/05/2014 Surgery Re-excision of additional left margin: no evidence of tumor.   10/13/2014 - 11/04/2014 Radiation Therapy Adjuvant RT completed ([facility]): Left breast 42.72 Gy over 21 fractions.   11/2014 -  Anti-estrogen oral therapy  Tamoxifen 20 mg daily ([facility]). Planned duration of therapy: 5 years.   04/04/2015 Survivorship Survivorship care plan completed and mailed to patient in lieu of in-person visit.     HORMONAL RISK FACTORS:  Menarche was at age 22.  First live birth at age 43.  OCP use for approximately 3 years.  Ovaries intact: yes.  Hysterectomy: no.  Menopausal status: postmenopausal.  HRT use: 5 years. Colonoscopy: no  Mammogram within the last year: yes. Number of breast biopsies: 1. Up to date with pelvic exams:  yes. Any excessive radiation exposure in the past:  Treatment for breast cancer  Past Medical History  Diagnosis Date  . Osteoarthritis     hands and feet  . History of kidney stones   . 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 History of bronchitis   . Radiation 10/13/14-11/04/14    Left breast  . Breast cancer 2015    DCIS  . Family history of breast cancer     Past Surgical History  Procedure Laterality Date  . Foot surgery Right 1995    exc. bone spur  . Tonsillectomy    . Tubal ligation    . Lasik    . Breast lumpectomy with radioactive seed localization Left 08/12/2014    Procedure:  RADIOACTIVE SEED LOCALIZATION LEFT BREAST LUMPECTOMY;  Surgeon: Excell Seltzer, MD;  Location: [facility];  Service: General;  Laterality: Left;  . Re-excision of breast lumpectomy Left 08/22/2014    Procedure:  RE-EXCISION OF LEFT BREAST LUMPECTOMY;  Surgeon: Excell Seltzer, MD;  Location: [facility];  Service: General;  Laterality: Left;  . Re-excision of breast lumpectomy Left 09/05/2014    Procedure: RE-EXCISION OF BREAST LUMPECTOMY;  Surgeon: Excell Seltzer, MD;  Location: [facility];  Service: General;  Laterality: Left;    Social History   Social History  . Marital Status: Married    Spouse Name: N/A  . Number of Children: N/A  . Years of Education: N/A   Social History Main Topics  . Smoking status: Never Smoker   . Smokeless  tobacco: Never Used  . Alcohol Use: No  . Drug Use: No  . Sexual Activity: Yes    Birth Control/ Protection: Post-menopausal   Other Topics Concern  . None   Social History Narrative     FAMILY HISTORY:  We obtained a detailed, 4-generation family history.  Significant diagnoses are listed below: Family History  Problem Relation Age of Onset  . Hypertension Father   . Hypertension Mother   . Heart disease Mother 24  . Breast cancer Sister 37   The patient has one son and one sister.  Her sister was diagnosed with breast cancer at 48.  One brother died at 29 months of age.  Her mother died of a heart attack at 77 and her father is alive at 83.  Her mother was one of 8 children, but 6 died from what sounds like RH- blood incompatibility.  Her father was one of 12 siblings and none had cancer.  Patient's maternal ancestors are of Caucasian descent, and paternal ancestors are of Caucasian descent. There is no reported Ashkenazi Jewish ancestry. There is no known consanguinity.  GENETIC COUNSELING ASSESSMENT: Janice Higgins is a 59 y.o. female with a personal and family history of breast cancer which somewhat suggestive of a familial breast cancer but not hereditary predisposition to cancer. We, therefore, discussed and recommended the following at today's visit.   DISCUSSION: We reviewed the characteristics, features and inheritance patterns of hereditary cancer syndromes. We discussed with Janice Higgins that the family history is not highly consistent with a familial hereditary cancer syndrome, and we feel she is at low risk to harbor a gene mutation associated with such a condition. Thus, we did not recommend any genetic testing, at this time, and recommended Janice Higgins continue to follow the cancer screening guidelines given by her primary healthcare provider.  Lastly, we encouraged Janice Higgins to remain in contact with cancer genetics annually so that we can continuously update the family history and inform  her of any changes in cancer genetics and testing that may be of benefit for this family.   Ms.  Higgins questions were answered to her satisfaction today. Our contact information was provided should additional questions or concerns arise. Thank you for the referral and allowing [place] to share in the care of your patient.   Karen P. Florene Glen, [place], [facility] Certified Genetic Counselor [email or web address] phone: [phone]  The patient was seen for a total of 35 minutes in face-to-face genetic counseling.  This patient was discussed with Drs. Magrinat, Lindi Adie and/or Burr Medico who agrees with the above.    _______________________________________________________________________ For Office Staff:  Number of people involved in session: 2 Was an Intern/ student involved with case: no

## 2015-04-11 ENCOUNTER — Encounter (HOSPITAL_COMMUNITY): Payer: 59

## 2015-04-11 ENCOUNTER — Encounter (HOSPITAL_COMMUNITY): Payer: Self-pay | Admitting: Hematology & Oncology

## 2015-04-11 ENCOUNTER — Encounter (HOSPITAL_BASED_OUTPATIENT_CLINIC_OR_DEPARTMENT_OTHER): Payer: 59 | Admitting: Hematology & Oncology

## 2015-04-11 ENCOUNTER — Other Ambulatory Visit (HOSPITAL_COMMUNITY): Payer: Self-pay | Admitting: Hematology & Oncology

## 2015-04-11 VITALS — BP 109/75 | HR 85 | Temp 97.9°F | Resp 18 | Wt 183.2 lb

## 2015-04-11 DIAGNOSIS — Z1239 Encounter for other screening for malignant neoplasm of breast: Secondary | ICD-10-CM | POA: Diagnosis not present

## 2015-04-11 DIAGNOSIS — C50912 Malignant neoplasm of unspecified site of left female breast: Secondary | ICD-10-CM

## 2015-04-11 DIAGNOSIS — Z853 Personal history of malignant neoplasm of breast: Secondary | ICD-10-CM

## 2015-04-11 NOTE — Progress Notes (Signed)
[facility] Progress Note  Patient Care Team: Merlyn Albert, MD as PCP - General (Family Medicine) Hubbard Hartshorn, NP as Nurse Practitioner (Nurse Practitioner) Lurline Hare, MD as Consulting Physician (Radiation Oncology) Allene Pyo, MD as Consulting Physician (Hematology and Oncology) Salomon Fick, NP as Nurse Practitioner (Nurse Practitioner) Glenna Fellows, MD as Consulting Physician (General Surgery)  CHIEF COMPLAINTS/PURPOSE OF CONSULTATION:  Digital screening mammogram on 05/30/2014, BI-RADS Category 0 with asymmetry in the left breast  Ultrasound of the left breast on 06/21/2014 showing slightly suspicious 5 mm left breast mass located at 3:30 position  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 Not interested in pursuing another therapy  HISTORY OF PRESENTING ILLNESS:  Janice Higgins 59 y.o. female is here for follow-up of DCIS. She completed XRT.  Her appetite is good.  The patient reports that she is tender and has sharp pain in her L breast.  She believes the weather causes her to ache at times.  She has not yet been to the gynecologist for a previous episode of vaginal bleeding. She notes that "it hasn't happened again." She currently goes to the PA at  Dr. Fletcher Anon office for her annual exams.  She notes that movement with her left arm is uncomfortable due to her breast being uncomfortable.  She denies swelling in her left arm.  She complains of gaining  weight but she notes that she is eating "everything she sees."   MEDICAL HISTORY:  Past Medical History  Diagnosis Date  . Osteoarthritis     hands and feet  . History of kidney stones   . 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 History of bronchitis   . Radiation 10/13/14-11/04/14    Left breast  . Breast cancer 2015    DCIS  . Family history of breast cancer     SURGICAL HISTORY: Past Surgical History  Procedure Laterality Date  . Foot surgery Right 1995    exc. bone spur  . Tonsillectomy    . Tubal ligation    . Lasik    . Breast lumpectomy with radioactive seed localization Left 08/12/2014    Procedure:  RADIOACTIVE SEED LOCALIZATION LEFT BREAST LUMPECTOMY;  Surgeon: Glenna Fellows, MD;  Location: [facility];  Service: General;  Laterality: Left;  . Re-excision of breast lumpectomy Left 08/22/2014    Procedure: RE-EXCISION OF LEFT BREAST LUMPECTOMY;  Surgeon: Glenna Fellows, MD;  Location: [facility];  Service: General;  Laterality: Left;  . Re-excision of breast lumpectomy Left 09/05/2014    Procedure: RE-EXCISION OF BREAST LUMPECTOMY;  Surgeon: Glenna Fellows, MD;  Location: [facility];  Service: General;  Laterality: Left;    SOCIAL HISTORY: Social History   Social History  . Marital Status: Married    Spouse Name: N/A  . Number of Children: N/A  . Years of Education: N/A   Occupational History  . Not on file.   Social History Main Topics  . Smoking status: Never Smoker   . Smokeless tobacco:  Never Used  . Alcohol Use: No  . Drug Use: No  . Sexual Activity: Yes    Birth Control/ Protection: Post-menopausal   Other Topics Concern  . Not on file   Social History Narrative   she is married for 36 years. She was born and [place]. She has worked at [employer] for greater than 30 years.  FAMILY HISTORY: Family History  Problem Relation Age of Onset  . Hypertension Father   . Hypertension Mother   . Heart  disease Mother 53  . Breast cancer Sister 64   indicated that her mother is deceased. She indicated that her father is alive. She indicated that her sister is alive. She indicated that her brother is deceased. She indicated that her maternal grandmother is deceased. She indicated that her maternal grandfather is deceased. She indicated that her paternal grandmother is deceased. She indicated that her paternal grandfather is deceased.   Her mother died 67 years ago at the age of 3 from an MI. She had asthma. Her father is alive at 26 with a new diagnosis of hypertension and thyroid problems. He is very independent. She had one brother that died at the age of 24 months from "water head". She has a sister who she states has psychiatric illness. Her nephew had the same disease as her brother and died at the age of 39.  ALLERGIES:  is allergic to sulfa antibiotics.  MEDICATIONS:  Current Outpatient Prescriptions  Medication Sig Dispense Refill  . acetaminophen (TYLENOL) 325 MG tablet Take 650 mg by mouth every 6 (six) hours as needed.    . diclofenac (VOLTAREN) 75 MG EC tablet TAKE 1 TABLET BY MOUTH TWICE DAILY WITH MEALS 60 tablet 3  . Doxylamine Succinate, Sleep, (SLEEP AID PO) Take 1 tablet by mouth at bedtime as needed.    Marland Kitchen escitalopram (LEXAPRO) 10 MG tablet TAKE 1 TABLET BY MOUTH ONCE DAILY 30 tablet 2  . albuterol (PROVENTIL HFA;VENTOLIN HFA) 108 (90 BASE) MCG/ACT inhaler Inhale 2 puffs into the lungs every 4 (four) hours as needed for wheezing. (Patient not taking: Reported on 09/28/2014) 1 Inhaler 5  . SUPREP BOWEL PREP SOLN Take 1 kit by mouth once. 1 Bottle 0  . tamoxifen (NOLVADEX) 20 MG tablet Take 1 tablet (20 mg total) by mouth daily. (Patient not taking: Reported on 01/09/2015) 30 tablet 2   No current facility-administered medications for this visit.    Review of Systems  Constitutional: Positive for malaise/fatigue. Negative for fever, chills and weight loss.  HENT: Negative for  congestion, hearing loss, nosebleeds, sore throat and tinnitus.   Eyes: Negative for blurred vision, double vision, pain and discharge.  Respiratory: Negative for cough, hemoptysis, sputum production, shortness of breath and wheezing.   Cardiovascular: Negative for chest pain, palpitations, claudication, leg swelling and PND.  Gastrointestinal: Negative for heartburn, nausea, vomiting, abdominal pain, diarrhea, constipation, blood in stool and melena.  Genitourinary: Negative for dysuria, urgency, frequency and hematuria.  Musculoskeletal: Negative for myalgias, joint pain and falls.  Skin: Negative for itching and rash.  Neurological: Negative for dizziness, tingling, tremors, sensory change, speech change, focal weakness, seizures, loss of consciousness, weakness and headaches.  Endo/Heme/Allergies: Does not bruise/bleed easily.  Psychiatric/Behavioral: Negative for depression, suicidal ideas, memory loss and substance abuse. The patient is not nervous/anxious and does not have insomnia.   14 point review of systems was performed and is negative except as detailed under history of present illness and above  PHYSICAL EXAMINATION: ECOG PERFORMANCE  STATUS: 0 - Asymptomatic  Filed Vitals:   04/11/15 1300  BP: 109/75  Pulse: 85  Temp: 97.9 F (36.6 C)  Resp: 18   Filed Weights   04/11/15 1300  Weight: 183 lb 3.2 oz (83.099 kg)    Physical Exam  Constitutional: She is oriented to person, place, and time and well-developed, well-nourished, and in no distress.  HENT:  Head: Normocephalic and atraumatic.  Nose: Nose normal.  Mouth/Throat: Oropharynx is clear and moist. No oropharyngeal exudate.  Eyes: Conjunctivae and EOM are normal. Pupils are equal, round, and reactive to light. Right eye exhibits no discharge. Left eye exhibits no discharge. No scleral icterus.  Neck: Normal range of motion. Neck supple. No tracheal deviation present. No thyromegaly present.  Cardiovascular: Normal  rate, regular rhythm and normal heart sounds.  Exam reveals no gallop and no friction rub.   No murmur heard. Pulmonary/Chest: Effort normal and breath sounds normal. She has no wheezes. She has no rales.  Abdominal: Soft. Bowel sounds are normal. She exhibits no distension and no mass. There is no tenderness. There is no rebound and no guarding.  Musculoskeletal: Normal range of motion. She exhibits no edema.  Lymphadenopathy:    She has no cervical adenopathy.  Neurological: She is alert and oriented to person, place, and time. She has normal reflexes. No cranial nerve deficit. Gait normal. Coordination normal.  Skin: Skin is warm and dry. No rash noted.  Psychiatric: Mood, memory, affect and judgment normal.  Nursing note and vitals reviewed. Breast exam: Skin of the L breast is darkened from radiation.  L breast is slightly firmer than the R.  She complains of tenderness with palpation. No palpable masses noted. No abnormal skin or nipple changes to the L breast. R breast exam is unremarkable. Both axillary regions are without palpable abnormality.   LABORATORY DATA:  I have reviewed the data as listed Lab Results  Component Value Date   WBC 7.2 01/09/2015   HGB 13.0 01/09/2015   HCT 40.3 01/09/2015   MCV 93.1 01/09/2015   PLT 289 01/09/2015     Chemistry      Component Value Date/Time   NA 142 01/09/2015 1234   K 3.6 01/09/2015 1234   CL 107 01/09/2015 1234   CO2 27 01/09/2015 1234   BUN 17 01/09/2015 1234   CREATININE 0.70 01/09/2015 1234   CREATININE 0.58 12/13/2013 0801      Component Value Date/Time   CALCIUM 9.3 01/09/2015 1234   ALKPHOS 46 01/09/2015 1234   AST 22 01/09/2015 1234   ALT 26 01/09/2015 1234   BILITOT 0.4 01/09/2015 1234      ASSESSMENT & PLAN:  DCIS left breast, adjuvant XRT, no endocrine therapy Adjustment disorder from cancer diagnosis, resolved L breast tenderness Episode of vaginal bleeding  59 year old female with DCIS, ER positive. She  has completed definitive surgical therapy and radiation. She was intolerant of tamoxifen. She is not interested in pursuing any other endocrine therapy.  I have advised her she must f/u with a gynecologist for evaluation of her vaginal bleeding.at our last appointment she had agreed to arrange this. She has not had follow-up. Therefore I am going to refer her to gynecology. I discussed with her my concerns and need for evaluation.   I have ordered her screening mammogram for November. I will see her back in 3 months.  Discussed methods weight loss including not eating late at night, watching the types of foods that she eats, portion control,  and of course exercise. I discussed with her setting realistic goals that are achievable.   This note was electronically signed.   This document serves as a record of services personally performed by Ancil Linsey, MD. It was created on her behalf by Janace Hoard, a trained medical scribe. The creation of this record is based on the scribe's personal observations and the provider's statements to them. This document has been checked and approved by the attending provider.  I have reviewed the above documentation for accuracy and completeness, and I agree with the above.  Shanon K. Whitney Muse, MD

## 2015-04-11 NOTE — Patient Instructions (Signed)
[place] at [facility] Discharge Instructions  RECOMMENDATIONS MADE BY THE CONSULTANT AND ANY TEST RESULTS WILL BE SENT TO YOUR REFERRING PHYSICIAN.  Exam and discussion by Dr. Whitney Muse. Will make a referral for you to see Derrek Monaco NP at GYN related to your vaginal bleeding. Mammogram to be scheduled. Report any new lumps, bone pain, shortness of breath or other symptoms.  Follow-up in 3 months.   Thank you for choosing [place] at [facility] to provide your oncology and hematology care.  To afford each patient quality time with our provider, please arrive at least 15 minutes before your scheduled appointment time.    You need to re-schedule your appointment should you arrive 10 or more minutes late.  We strive to give you quality time with our providers, and arriving late affects you and other patients whose appointments are after yours.  Also, if you no show three or more times for appointments you may be dismissed from the clinic at the providers discretion.     Again, thank you for choosing [facility].  Our hope is that these requests will decrease the amount of time that you wait before being seen by our physicians.       _____________________________________________________________  Should you have questions after your visit to [facility], please contact our office at [phone] between the hours of 8:30 a.m. and 4:30 p.m.  Voicemails left after 4:30 p.m. will not be returned until the following business day.  For prescription refill requests, have your pharmacy contact our office.

## 2015-04-12 ENCOUNTER — Encounter (HOSPITAL_COMMUNITY): Payer: Self-pay | Admitting: Hematology & Oncology

## 2015-04-25 ENCOUNTER — Encounter: Payer: Self-pay | Admitting: Adult Health

## 2015-04-25 ENCOUNTER — Other Ambulatory Visit (HOSPITAL_COMMUNITY)
Admission: RE | Admit: 2015-04-25 | Discharge: 2015-04-25 | Disposition: A | Discharge status: Home / Self Care | Payer: 59 | Source: Ambulatory Visit | Attending: Adult Health | Admitting: Adult Health

## 2015-04-25 ENCOUNTER — Ambulatory Visit (INDEPENDENT_AMBULATORY_CARE_PROVIDER_SITE_OTHER): Payer: 59 | Admitting: Adult Health

## 2015-04-25 VITALS — BP 140/80 | HR 84 | Ht 61.5 in | Wt 178.5 lb

## 2015-04-25 DIAGNOSIS — Z01419 Encounter for gynecological examination (general) (routine) without abnormal findings: Secondary | ICD-10-CM

## 2015-04-25 DIAGNOSIS — N950 Postmenopausal bleeding: Secondary | ICD-10-CM

## 2015-04-25 DIAGNOSIS — Z1212 Encounter for screening for malignant neoplasm of rectum: Secondary | ICD-10-CM | POA: Diagnosis not present

## 2015-04-25 DIAGNOSIS — Z853 Personal history of malignant neoplasm of breast: Secondary | ICD-10-CM

## 2015-04-25 DIAGNOSIS — Z1151 Encounter for screening for human papillomavirus (HPV): Secondary | ICD-10-CM | POA: Diagnosis not present

## 2015-04-25 HISTORY — DX: Personal history of malignant neoplasm of breast: Z85.3

## 2015-04-25 HISTORY — DX: Postmenopausal bleeding: N95.0

## 2015-04-25 LAB — HEMOCCULT GUIAC POC 1CARD (OFFICE): Fecal Occult Blood, POC: NEGATIVE

## 2015-04-25 NOTE — Patient Instructions (Signed)
Postmenopausal Bleeding Postmenopausal bleeding is any bleeding a woman has after she has entered into menopause. Menopause is the end of a woman's fertile years. After menopause, a woman no longer ovulates or has menstrual periods.  Postmenopausal bleeding can be caused by various things. Any type of postmenopausal bleeding, even if it appears to be a typical menstrual period, is concerning. This should be evaluated by your health care provider. Any treatment will depend on the cause of the bleeding. HOME CARE INSTRUCTIONS Monitor your condition for any changes. The following actions may help to alleviate any discomfort you are experiencing:  Avoid the use of tampons and douches as directed by your health care provider.  Change your pads frequently.  Get regular pelvic exams and Pap tests.  Keep all follow-up appointments for diagnostic tests as directed by your health care provider. SEEK MEDICAL CARE IF:   Your bleeding lasts more than 1 week.  You have abdominal pain.  You have bleeding with sexual intercourse. SEEK IMMEDIATE MEDICAL CARE IF:   You have a fever, chills, headache, dizziness, muscle aches, and bleeding.  You have severe pain with bleeding.  You are passing blood clots.  You have bleeding and need more than 1 pad an hour.  You feel faint. MAKE SURE YOU:  Understand these instructions.  Will watch your condition.  Will get help right away if you are not doing well or get worse.   This information is not intended to replace advice given to you by your health care provider. Make sure you discuss any questions you have with your health care provider.   Document Released: 10/09/2005 Document Revised: 04/21/2013 Document Reviewed: 01/28/2013 Elsevier Interactive Patient Education 2016 [place] [facility] [place] 10/18 at 4 pm

## 2015-04-25 NOTE — Progress Notes (Signed)
Patient ID: Jhordyn Hoopingarner, female   DOB: 16-Apr-1956, 59 y.o.   MRN: [id number] History of Present Illness: Mera is a 59 year old white female,married in for well woman gyn exam and pap.She had DCIS of left breast,ER+, and has had 3 surgeries, by Dr Excell Seltzer, and had chemo and radiation and was on tamoxifen for about 2 months and felt bad so she stopped it and then has has some spotting.She was referred by Dr Whitney Muse. PCP is C.Hoskins,FNP.   Current Medications, Allergies, Past Medical History, Past Surgical History, Family History and Social History were reviewed in [employer] record.     Review of Systems: Patient denies any headaches, hearing loss, fatigue, blurred vision, shortness of breath, chest pain, abdominal pain, problems with bowel movements, urination, or intercourse. No joint pain or mood swings.Has some weight gain, +PMB.Has cough since getting flu shot last week.    Physical Exam:BP 140/80 mmHg  Pulse 84  Ht 5' 1.5" (1.562 m)  Wt 178 lb 8 oz (80.967 kg)  BMI 33.19 kg/m2 General:  Well developed, well nourished, no acute distress Skin:  Warm and dry Neck:  Midline trachea, normal thyroid, good ROM, no lymphadenopathy Lungs; Clear to auscultation bilaterally Breast:  No dominant palpable mass, retraction, or nipple discharge, has well healed scar left breast at 3-5 o'clock and it is tender. Cardiovascular: Regular rate and rhythm Abdomen:  Soft, non tender, no hepatosplenomegaly Pelvic:  External genitalia is normal in appearance, no lesions.  The vagina has decreased color, moisture and rugae. Urethra has no lesions or masses. The cervix is smooth and stenotic, pap with HPV performed.  Uterus is felt to be normal size, shape, and contour.  No adnexal masses or tenderness noted.Bladder is non tender, no masses felt. Rectal: Good sphincter tone, no polyps, or hemorrhoids felt.  Hemoccult negative. Extremities/musculoskeletal:  No swelling or varicosities  noted, no clubbing or cyanosis Psych:  No mood changes, alert and cooperative,seems happy She has colonoscopy scheduled for 10/20 with Dr Laural Golden. Discussed getting [place] to assess uterus and endometrial lining.  Impression: Well woman gyn exam and pap PMB History of breast cancer    Plan: Return in 1 week for [place] Physical in 1 year Mammogram every 6 months per Dr Whitney Muse Labs with PCP Colonoscopy 05/04/15

## 2015-04-27 LAB — CYTOLOGY - PAP

## 2015-05-02 ENCOUNTER — Ambulatory Visit (INDEPENDENT_AMBULATORY_CARE_PROVIDER_SITE_OTHER): Payer: 59

## 2015-05-02 DIAGNOSIS — N950 Postmenopausal bleeding: Secondary | ICD-10-CM | POA: Diagnosis not present

## 2015-05-02 NOTE — Progress Notes (Signed)
PELVIC US TA/TV: heterogenous anteverted uterus,normal ov's bilat (mobile),no free fluid,EEC 4mm,no pain during ultrasound

## 2015-05-03 ENCOUNTER — Telehealth: Payer: Self-pay | Admitting: Adult Health

## 2015-05-03 NOTE — Telephone Encounter (Signed)
Pt aware [place] was normal no need for biopsy

## 2015-05-04 ENCOUNTER — Encounter (HOSPITAL_COMMUNITY): Payer: Self-pay | Admitting: *Deleted

## 2015-05-04 ENCOUNTER — Encounter (HOSPITAL_COMMUNITY)
Admission: RE | Disposition: A | Discharge status: Home / Self Care | Payer: Self-pay | Source: Ambulatory Visit | Attending: Internal Medicine

## 2015-05-04 ENCOUNTER — Ambulatory Visit (HOSPITAL_COMMUNITY)
Admission: RE | Admit: 2015-05-04 | Discharge: 2015-05-04 | Disposition: A | Discharge status: Home / Self Care | Payer: 59 | Source: Ambulatory Visit | Attending: Internal Medicine | Admitting: Internal Medicine

## 2015-05-04 DIAGNOSIS — D125 Benign neoplasm of sigmoid colon: Secondary | ICD-10-CM | POA: Insufficient documentation

## 2015-05-04 DIAGNOSIS — Z1211 Encounter for screening for malignant neoplasm of colon: Secondary | ICD-10-CM | POA: Diagnosis not present

## 2015-05-04 DIAGNOSIS — Z882 Allergy status to sulfonamides status: Secondary | ICD-10-CM | POA: Diagnosis not present

## 2015-05-04 DIAGNOSIS — K648 Other hemorrhoids: Secondary | ICD-10-CM | POA: Diagnosis not present

## 2015-05-04 DIAGNOSIS — K644 Residual hemorrhoidal skin tags: Secondary | ICD-10-CM | POA: Diagnosis not present

## 2015-05-04 DIAGNOSIS — Z853 Personal history of malignant neoplasm of breast: Secondary | ICD-10-CM | POA: Insufficient documentation

## 2015-05-04 DIAGNOSIS — Z87442 Personal history of urinary calculi: Secondary | ICD-10-CM | POA: Diagnosis not present

## 2015-05-04 HISTORY — PX: COLONOSCOPY: SHX5424

## 2015-05-04 SURGERY — COLONOSCOPY
Anesthesia: Moderate Sedation

## 2015-05-04 MED ORDER — MIDAZOLAM HCL 5 MG/5ML IJ SOLN
INTRAMUSCULAR | Status: AC
  Filled 2015-05-04: qty 5

## 2015-05-04 MED ORDER — MIDAZOLAM HCL 5 MG/5ML IJ SOLN
INTRAMUSCULAR | Status: AC
  Filled 2015-05-04: qty 10

## 2015-05-04 MED ORDER — MIDAZOLAM HCL 5 MG/5ML IJ SOLN
INTRAMUSCULAR | Status: DC | PRN
  Administered 2015-05-04 (×2): 1 mg via INTRAVENOUS
  Administered 2015-05-04: 2 mg via INTRAVENOUS
  Administered 2015-05-04 (×2): 1 mg via INTRAVENOUS
  Administered 2015-05-04: 2 mg via INTRAVENOUS
  Administered 2015-05-04 (×3): 1 mg via INTRAVENOUS

## 2015-05-04 MED ORDER — SODIUM CHLORIDE 0.9 % IV SOLN
INTRAVENOUS | Status: DC
  Administered 2015-05-04: 07:00:00 via INTRAVENOUS

## 2015-05-04 MED ORDER — MEPERIDINE HCL 50 MG/ML IJ SOLN
INTRAMUSCULAR | Status: AC
  Filled 2015-05-04: qty 1

## 2015-05-04 MED ORDER — MEPERIDINE HCL 50 MG/ML IJ SOLN
INTRAMUSCULAR | Status: DC | PRN
  Administered 2015-05-04 (×2): 25 mg via INTRAVENOUS

## 2015-05-04 MED ORDER — LIDOCAINE HCL 2 % EX GEL
CUTANEOUS | Status: AC
  Filled 2015-05-04: qty 30

## 2015-05-04 MED ORDER — STERILE WATER FOR IRRIGATION IR SOLN
Status: DC | PRN
  Administered 2015-05-04: 08:00:00

## 2015-05-04 NOTE — H&P (Signed)
Janice Higgins is an 59 y.o. female.   Chief Complaint: Patient is here for colonoscopy. HPI: Patient is 59 year old Caucasian female who is here for screening colonoscopy. She denies abdominal pain change in bowel habits or rectal bleeding. This is patient's first exam. Personal history is significant for ductal carcinoma in situ of left breast diagnosed in January this year. She was treated with lumpectomy and radiation and is doing fine. She was intolerant of tamoxifen. History is negative for CRC.  Past Medical History  Diagnosis Date  . Osteoarthritis     hands and feet  . History of kidney stones   . 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 History of bronchitis   . Radiation 10/13/14-11/04/14    Left breast  . Breast cancer ([place]) 2015    DCIS  . Family history of breast cancer   . PMB (postmenopausal bleeding) 04/25/2015  . History of breast cancer 04/25/2015    Past Surgical History  Procedure Laterality Date  . Foot surgery Right 1995    exc. bone spur  . Tonsillectomy    . Tubal ligation    . Lasik    . Breast lumpectomy with radioactive seed localization Left 08/12/2014    Procedure:  RADIOACTIVE SEED LOCALIZATION LEFT BREAST LUMPECTOMY;  Surgeon: Excell Seltzer, MD;  Location: [facility];  Service: General;  Laterality: Left;  . Re-excision of breast lumpectomy Left 08/22/2014    Procedure: RE-EXCISION OF LEFT BREAST LUMPECTOMY;  Surgeon: Excell Seltzer, MD;  Location: [facility];  Service: General;  Laterality: Left;  . Re-excision of breast lumpectomy Left 09/05/2014    Procedure: RE-EXCISION OF BREAST LUMPECTOMY;  Surgeon: Excell Seltzer, MD;  Location: [facility];  Service: General;  Laterality: Left;    Family History  Problem Relation Age of Onset  . Hypertension Father   . Hypertension Mother   . Heart disease Mother 29  . Breast cancer Sister 35  . Other Brother     lived 3 months  . Heart attack Maternal  Grandfather   . Emphysema Paternal Grandfather   . COPD Paternal Grandfather    Social History:  reports that she has never smoked. She has never used smokeless tobacco. She reports that she does not drink alcohol or use illicit drugs.  Allergies:  Allergies  Allergen Reactions  . Sulfa Antibiotics Hives    Medications Prior to Admission  Medication Sig Dispense Refill  . acetaminophen (TYLENOL) 325 MG tablet Take 650 mg by mouth every 6 (six) hours as needed.    . diclofenac (VOLTAREN) 75 MG EC tablet TAKE 1 TABLET BY MOUTH TWICE DAILY WITH MEALS (Patient taking differently: daily) 60 tablet 3  . Doxylamine Succinate, Sleep, (SLEEP AID PO) Take 1 tablet by mouth at bedtime as needed.    [place] BOWEL PREP SOLN Take 1 kit by mouth once. 1 Bottle 0    No results found for this or any previous visit (from the past 48 hour(s)). US Transvaginal Non-ob  05/02/2015  GYNECOLOGIC SONOGRAM Janice Higgins is a 59 y.o. G1P1 is here for a pelvic sonogram for postmenopausal bleeding, she was on tamoxifen x 2 mons. Uterus                      6.3 x 3.6 x 2.9 cm, heterogenous anteverted uterus Endometrium          3 mm, symmetrical, wnl Right ovary  1.7 x 1.6 x 1.1 cm, wnl Left ovary                1.9 x 2.2 x 1.1 cm, wnl Technician Comments: PELVIC US TA/TV: heterogenous anteverted uterus,normal ov's bilat (mobile),no free fluid,EEC 37mm,no pain during ultrasound [employer] 05/02/2015 5:24 PM Clinical Impression and recommendations: I have reviewed the sonogram results above, combined with the patient's current clinical course, below are my impressions and any appropriate recommendations for management based on the sonographic findings. Clinical Impression and recommendations: I have reviewed the sonogram results above, combined with the patient's current clinical course, below are my impressions and any appropriate recommendations for management based on the sonographic findings. Normal thin  endometrium, <5 mm, does not require biopsy Normal uterus and ovaries otherwise EURE,LUTHER H 05/02/2015 9:39 PM   US Pelvis Complete  05/02/2015  GYNECOLOGIC SONOGRAM Janice Higgins is a 59 y.o. G1P1 is here for a pelvic sonogram for postmenopausal bleeding, she was on tamoxifen x 2 mons. Uterus                      6.3 x 3.6 x 2.9 cm, heterogenous anteverted uterus Endometrium          3 mm, symmetrical, wnl Right ovary             1.7 x 1.6 x 1.1 cm, wnl Left ovary                1.9 x 2.2 x 1.1 cm, wnl Technician Comments: PELVIC US TA/TV: heterogenous anteverted uterus,normal ov's bilat (mobile),no free fluid,EEC 42mm,no pain during ultrasound [employer] 05/02/2015 5:24 PM Clinical Impression and recommendations: I have reviewed the sonogram results above, combined with the patient's current clinical course, below are my impressions and any appropriate recommendations for management based on the sonographic findings. Clinical Impression and recommendations: I have reviewed the sonogram results above, combined with the patient's current clinical course, below are my impressions and any appropriate recommendations for management based on the sonographic findings. Normal thin endometrium, <5 mm, does not require biopsy Normal uterus and ovaries otherwise EURE,LUTHER H 05/02/2015 9:39 PM    ROS  Blood pressure 115/77, pulse 89, temperature 98 F (36.7 C), temperature source Oral, resp. rate 19, height 5' 1.5" (1.562 m), weight 171 lb (77.565 kg), SpO2 93 %. Physical Exam  Constitutional: She appears well-developed and well-nourished.  HENT:  Mouth/Throat: Oropharynx is clear and moist.  Eyes: Conjunctivae are normal. No scleral icterus.  Neck: No thyromegaly present.  Cardiovascular: Normal rate, regular rhythm and normal heart sounds.   No murmur heard. Respiratory: Effort normal and breath sounds normal.  GI: Soft. She exhibits no distension and no mass. There is no tenderness.  Musculoskeletal:  She exhibits no edema.  Lymphadenopathy:    She has no cervical adenopathy.  Neurological: She is alert.  Skin: Skin is warm and dry.     Assessment/Plan Average risk screening colonoscopy.  Jacon Whetzel U 05/04/2015, 7:33 AM

## 2015-05-04 NOTE — Discharge Instructions (Signed)
No aspirin or NSAIDs for 1 week. Resume other medications and diet as before. No driving for 24 hours. His edition Will call with biopsy results..   Colonoscopy, Care After Refer to this sheet in the next few weeks. These instructions provide you with information on caring for yourself after your procedure. Your health care provider may also give you more specific instructions. Your treatment has been planned according to current medical practices, but problems sometimes occur. Call your health care provider if you have any problems or questions after your procedure. WHAT TO EXPECT AFTER THE PROCEDURE  After your procedure, it is typical to have the following:  A small amount of blood in your stool.  Moderate amounts of gas and mild abdominal cramping or bloating. HOME CARE INSTRUCTIONS  Do not drive, operate machinery, or sign important documents for 24 hours.  You may shower and resume your regular physical activities, but move at a slower pace for the first 24 hours.  Take frequent rest periods for the first 24 hours.  Walk around or put a warm pack on your abdomen to help reduce abdominal cramping and bloating.  Drink enough fluids to keep your urine clear or pale yellow.  You may resume your normal diet as instructed by your health care provider. Avoid heavy or fried foods that are hard to digest.  Avoid drinking alcohol for 24 hours or as instructed by your health care provider.  Only take over-the-counter or prescription medicines as directed by your health care provider.  If a tissue sample (biopsy) was taken during your procedure:  Do not take aspirin or blood thinners for 7 days, or as instructed by your health care provider.  Do not drink alcohol for 7 days, or as instructed by your health care provider.  Eat soft foods for the first 24 hours. SEEK MEDICAL CARE IF: You have persistent spotting of blood in your stool 2-3 days after the procedure. SEEK IMMEDIATE  MEDICAL CARE IF:  You have more than a small spotting of blood in your stool.  You pass large blood clots in your stool.  Your abdomen is swollen (distended).  You have nausea or vomiting.  You have a fever.  You have increasing abdominal pain that is not relieved with medicine.   This information is not intended to replace advice given to you by your health care provider. Make sure you discuss any questions you have with your health care provider.   Document Released: 02/13/2004 Document Revised: 04/21/2013 Document Reviewed: 03/08/2013 Elsevier Interactive Patient Education 2016 Elsevier Inc.  Colon Polyps Polyps are lumps of extra tissue growing inside the body. Polyps can grow in the large intestine (colon). Most colon polyps are noncancerous (benign). However, some colon polyps can become cancerous over time. Polyps that are larger than a pea may be harmful. To be safe, caregivers remove and test all polyps. CAUSES  Polyps form when mutations in the genes cause your cells to grow and divide even though no more tissue is needed. RISK FACTORS There are a number of risk factors that can increase your chances of getting colon polyps. They include:  Being older than 50 years.  Family history of colon polyps or colon cancer.  Long-term colon diseases, such as colitis or Crohn disease.  Being overweight.  Smoking.  Being inactive.  Drinking too much alcohol. SYMPTOMS  Most small polyps do not cause symptoms. If symptoms are present, they may include:  Blood in the stool. The stool may look  dark red or black.  Constipation or diarrhea that lasts longer than 1 week. DIAGNOSIS People often do not know they have polyps until their caregiver finds them during a regular checkup. Your caregiver can use 4 tests to check for polyps:  Digital rectal exam. The caregiver wears gloves and feels inside the rectum. This test would find polyps only in the rectum.  Barium enema. The  caregiver puts a liquid called barium into your rectum before taking X-rays of your colon. Barium makes your colon look white. Polyps are dark, so they are easy to see in the X-ray pictures.  Sigmoidoscopy. A thin, flexible tube (sigmoidoscope) is placed into your rectum. The sigmoidoscope has a light and tiny camera in it. The caregiver uses the sigmoidoscope to look at the last third of your colon.  Colonoscopy. This test is like sigmoidoscopy, but the caregiver looks at the entire colon. This is the most common method for finding and removing polyps. TREATMENT  Any polyps will be removed during a sigmoidoscopy or colonoscopy. The polyps are then tested for cancer. PREVENTION  To help lower your risk of getting more colon polyps:  Eat plenty of fruits and vegetables. Avoid eating fatty foods.  Do not smoke.  Avoid drinking alcohol.  Exercise every day.  Lose weight if recommended by your caregiver.  Eat plenty of calcium and folate. Foods that are rich in calcium include milk, cheese, and broccoli. Foods that are rich in folate include chickpeas, kidney beans, and spinach. HOME CARE INSTRUCTIONS Keep all follow-up appointments as directed by your caregiver. You may need periodic exams to check for polyps. SEEK MEDICAL CARE IF: You notice bleeding during a bowel movement.   This information is not intended to replace advice given to you by your health care provider. Make sure you discuss any questions you have with your health care provider.   Document Released: 03/27/2004 Document Revised: 07/22/2014 Document Reviewed: 09/10/2011 Elsevier Interactive Patient Education [employer].

## 2015-05-04 NOTE — Op Note (Signed)
COLONOSCOPY PROCEDURE REPORT  PATIENT:  Janice Higgins  MR#:  [id number] Birthdate:  04-29-1956, 59 y.o., female Endoscopist:  Dr. Rogene Houston, MD Referred By:  Dr. Kelle Darting, MD Procedure Date: 05/04/2015  Procedure:   Colonoscopy with snare polypectomy  Indications:  Patient is 59 year old Caucasian female who is undergoing average risk screening colonoscopy. This is patient's first exam. 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  Informed Consent:  The procedure and risks were reviewed with the patient and informed consent was obtained.  Medications:  Demerol 50 mg IV Versed 12 mg IV  Description of procedure:  After a digital rectal exam was performed, that colonoscope was advanced from the anus through the rectum and colon to the area of the cecum, ileocecal valve and appendiceal orifice. The cecum was deeply intubated. These structures were well-seen and photographed for the record. From the level of the cecum and ileocecal valve, the scope was slowly and cautiously withdrawn. The mucosal surfaces were carefully surveyed utilizing scope tip to flexion to facilitate fold flattening as needed. The scope was pulled down into the rectum where a thorough exam including retroflexion was performed. Pediatric colonoscope was exchanged with Slim scope in order to complete the examination.  Findings:   Prep excellent. Redundant sigmoid colon. Mucosa of cecum, ascending colon, hepatic flexure, transverse colon, splenic flexure and descending colon. Small pedunculated polyp noted at proximal sigmoid colon. Snare polypectomy performed. Single 360 clip applied stoup polypectomy site. Mucosa rest of the sigmoid colon and rectum was normal. Small hemorrhoids below the dentate line.    Therapeutic/Diagnostic Maneuvers Performed:  See above  Complications:  None  EBL: None  Cecal Withdrawal Time:  10 minutes  Impression:   Examination performed to cecum. Redundant sigmoid colon. Small pedunculated polyp hot snared from proximal sigmoid colon and 360 clip applied to polypectomy site. All external hemorrhoids.  Recommendations:  Standard instructions given. No aspirin or NSAIDs for 1 week. I will contact patient with biopsy results and further recommendations.  REHMAN,NAJEEB U  05/04/2015 8:33 AM  CC: Dr. Mickie Hillier, MD & Dr. Rayne Du ref. provider found

## 2015-05-09 ENCOUNTER — Encounter (HOSPITAL_COMMUNITY): Payer: Self-pay | Admitting: Internal Medicine

## 2015-05-09 ENCOUNTER — Other Ambulatory Visit (HOSPITAL_COMMUNITY): Payer: Self-pay | Admitting: Hematology & Oncology

## 2015-05-09 DIAGNOSIS — Z853 Personal history of malignant neoplasm of breast: Secondary | ICD-10-CM

## 2015-06-06 ENCOUNTER — Ambulatory Visit (HOSPITAL_COMMUNITY)
Admission: RE | Admit: 2015-06-06 | Discharge: 2015-06-06 | Disposition: A | Discharge status: Home / Self Care | Payer: 59 | Source: Ambulatory Visit | Attending: Hematology & Oncology | Admitting: Hematology & Oncology

## 2015-06-06 ENCOUNTER — Encounter (HOSPITAL_COMMUNITY): Payer: 59

## 2015-06-06 DIAGNOSIS — Z853 Personal history of malignant neoplasm of breast: Secondary | ICD-10-CM | POA: Insufficient documentation

## 2015-06-14 ENCOUNTER — Encounter: Payer: Self-pay | Admitting: Nurse Practitioner

## 2015-06-14 ENCOUNTER — Ambulatory Visit (INDEPENDENT_AMBULATORY_CARE_PROVIDER_SITE_OTHER): Payer: 59 | Admitting: Nurse Practitioner

## 2015-06-14 VITALS — BP 144/90 | Ht 61.5 in | Wt 181.5 lb

## 2015-06-14 DIAGNOSIS — J069 Acute upper respiratory infection, unspecified: Secondary | ICD-10-CM | POA: Diagnosis not present

## 2015-06-14 DIAGNOSIS — J209 Acute bronchitis, unspecified: Secondary | ICD-10-CM

## 2015-06-14 DIAGNOSIS — B9689 Other specified bacterial agents as the cause of diseases classified elsewhere: Secondary | ICD-10-CM

## 2015-06-14 MED ORDER — HYDROCODONE-HOMATROPINE 5-1.5 MG/5ML PO SYRP: 5.0000 mL | ORAL_SOLUTION | ORAL | Status: DC | PRN

## 2015-06-14 MED ORDER — NALTREXONE-BUPROPION HCL ER 8-90 MG PO TB12: ORAL_TABLET | ORAL | Status: DC

## 2015-06-14 MED ORDER — AZITHROMYCIN 250 MG PO TABS: ORAL_TABLET | ORAL | Status: DC

## 2015-06-14 MED ORDER — ALBUTEROL SULFATE HFA 108 (90 BASE) MCG/ACT IN AERS: 2.0000 | INHALATION_SPRAY | RESPIRATORY_TRACT | Status: DC | PRN

## 2015-06-16 ENCOUNTER — Encounter: Payer: Self-pay | Admitting: Nurse Practitioner

## 2015-06-16 NOTE — Progress Notes (Signed)
Subjective:  Presents to discuss her weight. Recently went treatment for breast cancer. States she basically gave up as far as her diet and exercise would like to get back on track. Would like medication to help her jumpstart her weight loss. Has had some mild swelling since her weight gain. No chest pain/ischemic type pain. Shortness of breath only related to inactivity. Also complaints of sinus symptoms for the past 3 days. Headache. Sore throat. Occasional cough. Ear pain. Slight wheezing at times. No fever. Taking fluids well. Needs refill on her albuterol inhaler.  Objective:   BP 144/90 mmHg  Ht 5' 1.5" (1.562 m)  Wt 181 lb 8 oz (82.328 kg)  BMI 33.74 kg/m2 NAD. Alert, oriented. TMs clear effusion, no erythema. Pharynx erythematous with green PND noted. Neck supple with mild soft anterior adenopathy. Lungs faint expiratory crackles, no wheezing or tachypnea. Heart regular rate rhythm. Lower extremities no edema.  Assessment:  Problem List Items Addressed This Visit      Other   Morbid obesity due to excess calories (HCC)   Relevant Medications   Naltrexone-Bupropion HCl ER 8-90 MG TB12   Naltrexone-Bupropion HCl ER 8-90 MG TB12    Other Visit Diagnoses    Bacterial upper respiratory infection    -  Primary    Relevant Medications    azithromycin (ZITHROMAX Z-PAK) 250 MG tablet    Acute bronchitis, unspecified organism           Plan:  Meds ordered this encounter  Medications  . azithromycin (ZITHROMAX Z-PAK) 250 MG tablet    Sig: Take 2 tablets (500 mg) on  Day 1,  followed by 1 tablet (250 mg) once daily on Days 2 through 5.    Dispense:  6 each    Refill:  0    Order Specific Question:  Supervising Provider    Answer:  Mikey Kirschner [[id number]]  . albuterol (PROVENTIL HFA;VENTOLIN HFA) 108 (90 BASE) MCG/ACT inhaler    Sig: Inhale 2 puffs into the lungs every 4 (four) hours as needed.    Dispense:  1 Inhaler    Refill:  0    Order Specific Question:  Supervising Provider     Answer:  Mikey Kirschner [[id number]]  . HYDROcodone-homatropine (HYCODAN) 5-1.5 MG/5ML syrup    Sig: Take 5 mLs by mouth every 4 (four) hours as needed.    Dispense:  120 mL    Refill:  0    Order Specific Question:  Supervising Provider    Answer:  Mikey Kirschner [[id number]]  . Naltrexone-Bupropion HCl ER 8-90 MG TB12    Sig: One po qam x 1 week then one po BID x 1 week then 2 po qam and one po qpm x 1 week then 2 po BID    Dispense:  90 tablet    Refill:  0    Order Specific Question:  Supervising Provider    Answer:  Mikey Kirschner [[id number]]  . Naltrexone-Bupropion HCl ER 8-90 MG TB12    Sig: 2 po BID    Dispense:  120 tablet    Refill:  1    Order Specific Question:  Supervising Provider    Answer:  Mikey Kirschner [[id number]]   Do not take Contrave with Hycodan syrup. Start med once illness has resolved. Call back in 7-10 d if no improvement, sooner if worse. Encouraged healthy diet with reduced sugar and simple carbohydrates. Gradually start daily walking program. Also recommend organized weight loss program.  Return in about 3 months (around 09/12/2015) for recheck.

## 2015-06-19 ENCOUNTER — Encounter: Payer: Self-pay | Admitting: Nurse Practitioner

## 2015-06-23 ENCOUNTER — Telehealth: Payer: Self-pay | Admitting: Family Medicine

## 2015-06-23 NOTE — Telephone Encounter (Signed)
Rx prior auth APPROVED for pt's Naltrexone-Bupropion HCl ER 8-90 MG TB12 (Contrave) Valid 06/20/15-10/10/15 through [employer] (they notified pt)  Faxed approval to [place]

## 2015-06-23 NOTE — Telephone Encounter (Signed)
Correction to previous phone message, faxed approval for Contrave to [facility]

## 2015-07-11 ENCOUNTER — Ambulatory Visit (HOSPITAL_COMMUNITY): Payer: 59 | Admitting: Hematology & Oncology

## 2015-07-12 ENCOUNTER — Encounter (HOSPITAL_COMMUNITY): Payer: Self-pay | Admitting: Hematology & Oncology

## 2015-07-12 ENCOUNTER — Encounter (HOSPITAL_COMMUNITY): Payer: 59 | Attending: Hematology & Oncology | Admitting: Hematology & Oncology

## 2015-07-12 VITALS — BP 139/89 | HR 99 | Temp 98.4°F | Resp 16 | Wt 176.8 lb

## 2015-07-12 DIAGNOSIS — Z1239 Encounter for other screening for malignant neoplasm of breast: Secondary | ICD-10-CM

## 2015-07-12 DIAGNOSIS — N939 Abnormal uterine and vaginal bleeding, unspecified: Secondary | ICD-10-CM | POA: Diagnosis not present

## 2015-07-12 DIAGNOSIS — D0512 Intraductal carcinoma in situ of left breast: Secondary | ICD-10-CM

## 2015-07-12 NOTE — Progress Notes (Signed)
[facility] PROGRESS NOTE  Patient Care Team: Mikey Kirschner, MD as PCP - General (Family Medicine) Holley Bouche, NP as Nurse Practitioner (Nurse Practitioner) Thea Silversmith, MD as Consulting Physician (Radiation Oncology) Patrici Ranks, MD as Consulting Physician (Hematology and Oncology) Sylvan Cheese, NP as Nurse Practitioner (Nurse Practitioner) Excell Seltzer, MD as Consulting Physician (General Surgery)  CHIEF COMPLAINTS/PURPOSE OF CONSULTATION:  Digital screening mammogram on 05/30/2014, BI-RADS Category 0 with asymmetry in the left breast  Ultrasound of the left breast on 06/21/2014 showing slightly suspicious 5 mm left breast mass located at 3:30 position  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 Not interested in pursuing another therapy  HISTORY OF PRESENTING ILLNESS:  Janice Higgins 59 y.o. female is here for follow-up of DCIS. She completed XRT.  Her appetite is good.  Mrs. Guier returns to the [employer] alone today. She says that she's been having sinus issues since yesterday.  She comments that "work is work," but it's going well. She remarks that everything else is also going well. She had a good Christmas and got to visit with her sister.  She had a mammogram in November and afterward received a letter that alarmed her. After a weekend of anxiety, she was told by the [facility] in [place] that she'd received the letter in error. She spoke on the  phone with Anderson Malta  at the [facility].  Her PCP is Dr. Wolfgang Phoenix. She has had her colonoscopy since her last appointment.  She experiences financial difficulties covering the costs of her medical follow-up. She has no concerns regarding her breasts. She overall feels well. Mood is good.   MEDICAL HISTORY:  Past Medical History  Diagnosis Date  . Osteoarthritis     hands and feet  . History of kidney stones   . 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 History of bronchitis   . Radiation 10/13/14-11/04/14    Left breast  . Breast cancer ([place]) 2015    DCIS  . Family history of breast cancer   . PMB (postmenopausal bleeding) 04/25/2015  . History of breast cancer 04/25/2015    SURGICAL HISTORY: Past Surgical History  Procedure Laterality Date  . Foot surgery Right 1995    exc. bone spur  . Tonsillectomy    . Tubal ligation    . Lasik    . Breast lumpectomy with radioactive seed localization Left 08/12/2014    Procedure:  RADIOACTIVE SEED LOCALIZATION LEFT BREAST LUMPECTOMY;  Surgeon: Excell Seltzer, MD;  Location: [facility];  Service: General;  Laterality: Left;  . Re-excision of breast lumpectomy Left 08/22/2014    Procedure: RE-EXCISION OF LEFT BREAST LUMPECTOMY;  Surgeon: Excell Seltzer, MD;  Location: [place];  Service: General;  Laterality: Left;  . Re-excision of breast lumpectomy Left 09/05/2014    Procedure: RE-EXCISION OF BREAST LUMPECTOMY;  Surgeon: Excell Seltzer, MD;  Location: [facility];  Service: General;  Laterality: Left;  . Colonoscopy N/A 05/04/2015    Procedure: COLONOSCOPY;  Surgeon: Rogene Houston, MD;  [facility];  Service: Endoscopy;  Laterality: N/A;  7:30    SOCIAL HISTORY: Social History   Social History  . Marital Status: Married    Spouse Name: N/A  . Number of Children: N/A  . Years of Education: N/A   Occupational History  . Not on file.   Social History Main Topics  .  Smoking status: Never Smoker   . Smokeless tobacco: Never Used  . Alcohol Use: No  . Drug Use: No  . Sexual Activity: Yes    Birth Control/ Protection: Post-menopausal   Other Topics Concern  . Not on file   Social History Narrative   she is married for 36 years. She was born and [place]. She has worked at [employer] for greater than 30 years.  FAMILY HISTORY: Family History  Problem Relation Age of Onset  . Hypertension Father   . Hypertension Mother   . Heart disease Mother 22  . Breast cancer Sister 78  . Other Brother     lived 3 months  . Heart attack Maternal Grandfather   . Emphysema Paternal Grandfather   . COPD Paternal Grandfather    indicated that her mother is deceased. She indicated that her father is alive. She indicated that her sister is alive. She indicated that her brother is deceased. She indicated that her maternal grandmother is deceased. She indicated that her maternal grandfather is deceased. She indicated that her paternal grandmother is deceased. She indicated that her paternal grandfather is deceased. She indicated that her son is alive.   Her mother died 51 years ago at the age of 54 from an MI. She had asthma. Her father is alive at 42 with a new diagnosis of hypertension and thyroid problems. He is very independent. She had one brother that died at the age of 80 months from "water head". She has a sister who she states has psychiatric illness. Her nephew had the same disease as her brother and died at the age of 50.  ALLERGIES:  is allergic to sulfa antibiotics.  MEDICATIONS:  Current Outpatient Prescriptions  Medication Sig Dispense Refill  . acetaminophen (TYLENOL) 325 MG tablet Take 650 mg by mouth every 6 (six) hours as needed.    Marland Kitchen albuterol (PROVENTIL HFA;VENTOLIN HFA) 108 (90 BASE) MCG/ACT inhaler Inhale 2 puffs into the lungs every 4 (four) hours as needed. 1 Inhaler 0  . Doxylamine Succinate, Sleep, (SLEEP AID PO) Take 1 tablet by mouth at  bedtime as needed.    . Naltrexone-Bupropion HCl ER 8-90 MG TB12 2 po BID 120 tablet 1  . azithromycin (ZITHROMAX Z-PAK) 250 MG tablet Take 2 tablets (500 mg) on  Day 1,  followed by 1 tablet (250 mg) once daily on Days 2 through 5. (Patient not taking: Reported on 07/12/2015) 6 each 0  . HYDROcodone-homatropine (HYCODAN) 5-1.5 MG/5ML syrup Take 5 mLs by mouth every 4 (four) hours as needed. (Patient not taking: Reported on 07/12/2015) 120 mL 0  . Naltrexone-Bupropion HCl ER 8-90 MG TB12 One po qam x 1 week then one po BID x 1 week then 2 po qam and one po qpm x 1 week then 2 po BID (Patient not taking: Reported on 07/12/2015) 90 tablet 0   No current facility-administered medications for this visit.    Review of Systems  Constitutional: Positive for malaise/fatigue. Negative for fever, chills and weight loss.  HENT: Negative for congestion, hearing loss, nosebleeds, sore throat  and tinnitus.   Eyes: Negative for blurred vision, double vision, pain and discharge.  Respiratory: Negative for cough, hemoptysis, sputum production, shortness of breath and wheezing.   Cardiovascular: Negative for chest pain, palpitations, claudication, leg swelling and PND.  Gastrointestinal: Negative for heartburn, nausea, vomiting, abdominal pain, diarrhea, constipation, blood in stool and melena.  Genitourinary: Negative for dysuria, urgency, frequency and hematuria.  Musculoskeletal: Negative for myalgias, joint pain and falls.  Skin: Negative for itching and rash.  Neurological: Negative for dizziness, tingling, tremors, sensory change, speech change, focal weakness, seizures, loss of consciousness, weakness and headaches.  Endo/Heme/Allergies: Does not bruise/bleed easily.  Psychiatric/Behavioral: Negative for depression, suicidal ideas, memory loss and substance abuse. The patient is not nervous/anxious and does not have insomnia.   14 point review of systems was performed and is negative except as detailed  under history of present illness and above   PHYSICAL EXAMINATION: ECOG PERFORMANCE STATUS: 0 - Asymptomatic  Filed Vitals:   07/12/15 1356  BP: 139/89  Pulse: 99  Temp: 98.4 F (36.9 C)  Resp: 16   Filed Weights   07/12/15 1356  Weight: 176 lb 12.8 oz (80.196 kg)    Physical Exam  Constitutional: She is oriented to person, place, and time and well-developed, well-nourished, and in no distress.  HENT:  Head: Normocephalic and atraumatic.  Nose: Nose normal.  Mouth/Throat: Oropharynx is clear and moist. No oropharyngeal exudate.  Eyes: Conjunctivae and EOM are normal. Pupils are equal, round, and reactive to light. Right eye exhibits no discharge. Left eye exhibits no discharge. No scleral icterus.  Neck: Normal range of motion. Neck supple. No tracheal deviation present. No thyromegaly present.  Cardiovascular: Normal rate, regular rhythm and normal heart sounds.  Exam reveals no gallop and no friction rub.   No murmur heard. Pulmonary/Chest: Effort normal and breath sounds normal. She has no wheezes. She has no rales.  Abdominal: Soft. Bowel sounds are normal. She exhibits no distension and no mass. There is no tenderness. There is no rebound and no guarding.  Musculoskeletal: Normal range of motion. She exhibits no edema.  Lymphadenopathy:    She has no cervical adenopathy.  Neurological: She is alert and oriented to person, place, and time. She has normal reflexes. No cranial nerve deficit. Gait normal. Coordination normal.  Skin: Skin is warm and dry. No rash noted.  Psychiatric: Mood, memory, affect and judgment normal.  Nursing note and vitals reviewed. Breast exam: Skin of the L breast is darkened from radiation.  L breast is slightly firmer than the R.  She complains of tenderness with palpation. No palpable masses noted. No abnormal skin or nipple changes to the L breast. R breast exam is unremarkable. Both axillary regions are without palpable  abnormality.   LABORATORY DATA:  I have reviewed the data as listed  Lab Results  Component Value Date   WBC 7.2 01/09/2015   HGB 13.0 01/09/2015   HCT 40.3 01/09/2015   MCV 93.1 01/09/2015   PLT 289 01/09/2015     Chemistry      Component Value Date/Time   NA 142 01/09/2015 1234   K 3.6 01/09/2015 1234   CL 107 01/09/2015 1234   CO2 27 01/09/2015 1234   BUN 17 01/09/2015 1234   CREATININE 0.70 01/09/2015 1234   CREATININE 0.58 12/13/2013 0801      Component Value Date/Time   CALCIUM 9.3 01/09/2015 1234   ALKPHOS 46 01/09/2015 1234   AST 22 01/09/2015 1234   ALT  26 01/09/2015 1234   BILITOT 0.4 01/09/2015 1234      ASSESSMENT & PLAN:  DCIS left breast, adjuvant XRT, no endocrine therapy Adjustment disorder from cancer diagnosis, resolved L breast tenderness Episode of vaginal bleeding  59 year old female with DCIS, ER positive. She has completed definitive surgical therapy and radiation. She was intolerant of tamoxifen. She is not interested in pursuing any other endocrine therapy.  She was evaluated by ob/gyn. She is also up to date with both mammography and colonoscopy.  She has current financial difficulties covering her medical bills. Given that she is not on endocrine therapy and she continues to follow with surgery I will move her visit out to 6 months.   We will see her back in 6 months for follow-up.  All questions were answered. The patient knows to call the clinic with any problems, questions or concerns. We can certainly see the patient much sooner if necessary.  This document serves as a record of services personally performed by Ancil Linsey, MD. It was created on her behalf by Toni Amend, a trained medical scribe. The creation of this record is based on the scribe's personal observations and the provider's statements to them. This document has been checked and approved by the attending provider.  I have reviewed the above documentation for  accuracy and completeness, and I agree with the above.  This note was electronically signed.   Shanon K. Whitney Muse, MD

## 2015-07-12 NOTE — Patient Instructions (Signed)
[place] at [facility] Discharge Instructions  RECOMMENDATIONS MADE BY THE CONSULTANT AND ANY TEST RESULTS WILL BE SENT TO YOUR REFERRING PHYSICIAN.   Exam completed by Dr Whitney Muse today If you find any new lumps or bumps please call us Return to see doctor in 6 months Please call the clinic if you have any questions or concerns     Thank you for choosing [place] at [facility] to provide your oncology and hematology care.  To afford each patient quality time with our provider, please arrive at least 15 minutes before your scheduled appointment time.    You need to re-schedule your appointment should you arrive 10 or more minutes late.  We strive to give you quality time with our providers, and arriving late affects you and other patients whose appointments are after yours.  Also, if you no show three or more times for appointments you may be dismissed from the clinic at the providers discretion.     Again, thank you for choosing [facility].  Our hope is that these requests will decrease the amount of time that you wait before being seen by our physicians.       _____________________________________________________________  Should you have questions after your visit to [facility], please contact our office at [phone] between the hours of 8:30 a.m. and 4:30 p.m.  Voicemails left after 4:30 p.m. will not be returned until the following business day.  For prescription refill requests, have your pharmacy contact our office.

## 2015-07-13 ENCOUNTER — Encounter (HOSPITAL_COMMUNITY): Payer: Self-pay | Admitting: Hematology & Oncology

## 2015-08-16 ENCOUNTER — Telehealth: Payer: Self-pay | Admitting: Nurse Practitioner

## 2015-08-16 NOTE — Telephone Encounter (Signed)
Patient states that her left knee all the way down to her toes are swollen. Not sure if you want more than just her knee xrayed or not. Patient states that she is not having much pain at all.

## 2015-08-16 NOTE — Telephone Encounter (Signed)
Pt called stating that she fell last week and her knee is hurting and her foot is swollen. Pt wants to know if she can get an xray of her knee ordered.

## 2015-08-17 NOTE — Telephone Encounter (Signed)
Recommend office visit. She will probably need xray but may require more depending on exam.

## 2015-08-18 ENCOUNTER — Encounter: Payer: Self-pay | Admitting: Family Medicine

## 2015-08-18 ENCOUNTER — Ambulatory Visit (INDEPENDENT_AMBULATORY_CARE_PROVIDER_SITE_OTHER): Payer: 59 | Admitting: Family Medicine

## 2015-08-18 VITALS — BP 132/80 | Ht 61.5 in | Wt 177.0 lb

## 2015-08-18 DIAGNOSIS — S8012XA Contusion of left lower leg, initial encounter: Secondary | ICD-10-CM

## 2015-08-18 DIAGNOSIS — S8002XA Contusion of left knee, initial encounter: Secondary | ICD-10-CM

## 2015-08-18 MED ORDER — HYDROCODONE-ACETAMINOPHEN 5-325 MG PO TABS: 1.0000 | ORAL_TABLET | Freq: Every evening | ORAL | Status: DC | PRN

## 2015-08-18 NOTE — Progress Notes (Signed)
   Subjective:    Patient ID: Janice Higgins, female    DOB: 23-Dec-1955, 60 y.o.   MRN: [id number]  HPIRunning to answer phone and fell and hurt left leg. Pain and swelling leg down to foot.   Pain primarily left anterior knee. Radiating to anterior shin. Patient  Has noted blood now dissecting to the foot. With some swelling and pain in the front part of the ankle.   No calf pain  Technique anti-inflammatory medicine once per day having difficulty going to sleep with aching discomfort    Review of Systems No rash no hip pain ROS otherwise negative    Objective:   Physical Exam  Work vitals stable distress lungs clear heart rhythm left knee and anterior shin impressive hematoma with dissection down to ankle. Good range of motion knee no obvious point tenderness ankle good range of motion negative Homans sign      Assessment & Plan:  Impression contusion with secondary hematoma plan increase anti-inflammatory twice a day. Keep ambulating but when sitting feet foot up. Local measures discussed increase anti-inflammatory twice a day. Add nightly hydrocodone expect slow resolution WSL

## 2015-08-18 NOTE — Telephone Encounter (Signed)
Patient transferred to front desk to schedule appointment.  

## 2015-08-25 ENCOUNTER — Other Ambulatory Visit: Payer: Self-pay | Admitting: Family Medicine

## 2015-08-25 MED FILL — DICLOFENAC SOD EC 75 MG TAB: 75 | 30 days supply | Qty: 60 | Fill #0

## 2015-09-11 ENCOUNTER — Ambulatory Visit (INDEPENDENT_AMBULATORY_CARE_PROVIDER_SITE_OTHER): Payer: 59 | Admitting: Nurse Practitioner

## 2015-09-11 ENCOUNTER — Encounter: Payer: Self-pay | Admitting: Nurse Practitioner

## 2015-09-11 VITALS — BP 130/82 | Ht 61.5 in | Wt 181.0 lb

## 2015-09-11 DIAGNOSIS — M722 Plantar fascial fibromatosis: Secondary | ICD-10-CM | POA: Diagnosis not present

## 2015-09-11 MED ORDER — NALTREXONE-BUPROPION HCL ER 8-90 MG PO TB12: ORAL_TABLET | ORAL | Status: DC

## 2015-09-11 NOTE — Patient Instructions (Signed)
[email or web address]

## 2015-09-13 ENCOUNTER — Encounter: Payer: Self-pay | Admitting: Nurse Practitioner

## 2015-09-13 NOTE — Progress Notes (Signed)
Subjective:   Presents for recheck on her weight. Has struggled even with concentrating due to decreased activity. Patient fell recently and bruised her knee. This has improved. Would like to continue Contrave for now. Also has had a flareup of her plantar fasciitis. Plans to make appointment with local podiatrist for evaluation.  Objective:   BP 130/82 mmHg  Ht 5' 1.5" (1.562 m)  Wt 181 lb (82.101 kg)  BMI 33.65 kg/m2  NAD. Alert, oriented. Lungs clear. Heart regular rate rhythm. Has lost 4 pounds since her previous visit on to 317.  Assessment: PLANTAR FACIITIS  Morbid obesity due to excess calories ([place])  Plan:  Meds ordered this encounter  Medications  . DISCONTD: Naltrexone-Bupropion HCl ER 8-90 MG TB12    Sig: 2 po BID    Dispense:  120 tablet    Refill:  2    Order Specific Question:  Supervising Provider    Answer:  Mikey Kirschner [[id number]]  . Naltrexone-Bupropion HCl ER 8-90 MG TB12    Sig: 2 po BID    Dispense:  120 tablet    Refill:  2    Order Specific Question:  Supervising Provider    Answer:  Maggie Font    Continue contrave for now. Increase activity as tolerated. Healthy diet. Recheck in 3 months, will decide then whether to continue. Follow-up with podiatry as planned.

## 2015-11-17 MED FILL — DICLOFENAC SOD EC 75 MG TAB: 75 | 30 days supply | Qty: 60 | Fill #1

## 2015-12-06 ENCOUNTER — Ambulatory Visit: Payer: 59 | Admitting: Nurse Practitioner

## 2015-12-06 DIAGNOSIS — M79671 Pain in right foot: Secondary | ICD-10-CM | POA: Diagnosis not present

## 2015-12-06 DIAGNOSIS — M722 Plantar fascial fibromatosis: Secondary | ICD-10-CM | POA: Diagnosis not present

## 2015-12-14 ENCOUNTER — Telehealth: Payer: Self-pay | Admitting: *Deleted

## 2015-12-14 NOTE — Telephone Encounter (Signed)
Fax from [employer]. contrave denied see form on your desk. Do you want to change med.

## 2015-12-15 NOTE — Telephone Encounter (Signed)
Please ask Janice Higgins to check with her insurance to see if they will cover any other weight loss meds.

## 2015-12-15 NOTE — Telephone Encounter (Signed)
Spoke with patient and informed her that we received a denial for Contrave. Asked her per Linzie Collin if she can call her insurance company and see if they cover any other weight loss medication. Patient verbalized understanding and stated that she would contact her insurance.

## 2015-12-27 DIAGNOSIS — M79672 Pain in left foot: Secondary | ICD-10-CM | POA: Diagnosis not present

## 2015-12-27 DIAGNOSIS — M722 Plantar fascial fibromatosis: Secondary | ICD-10-CM | POA: Diagnosis not present

## 2015-12-28 MED FILL — METHYLPREDNISOLONE 4 MG TAB: 4 | 6 days supply | Qty: 21 | Fill #0

## 2016-01-06 IMAGING — MG MM AP DIAG 15 MIN
6 of 10 series · 6 of 26 positions shown · non-contrast
Comparison: Previous exam(s).

CLINICAL DATA: History of treated left breast cancer, status post
lumpectomy in 0804.

EXAM:
DIGITAL DIAGNOSTIC BILATERAL MAMMOGRAM WITH 3D TOMOSYNTHESIS AND CAD

[L CC (1 of 3)]
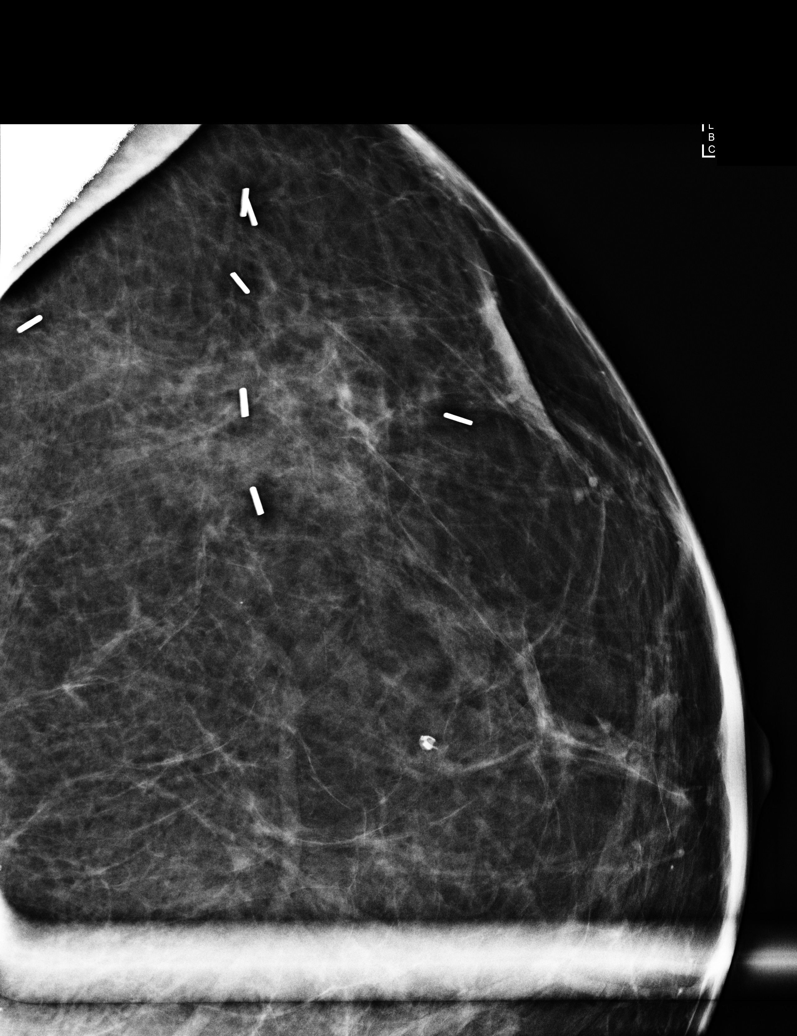

[L CC (2 of 3)]
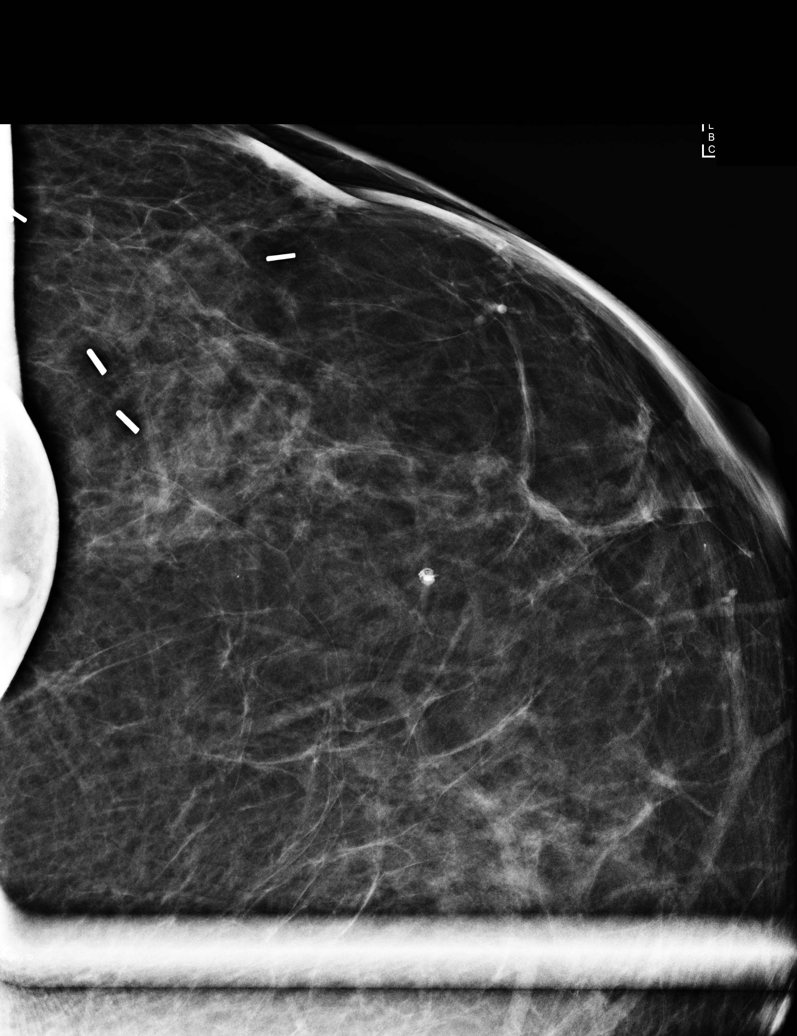

[L MLO]
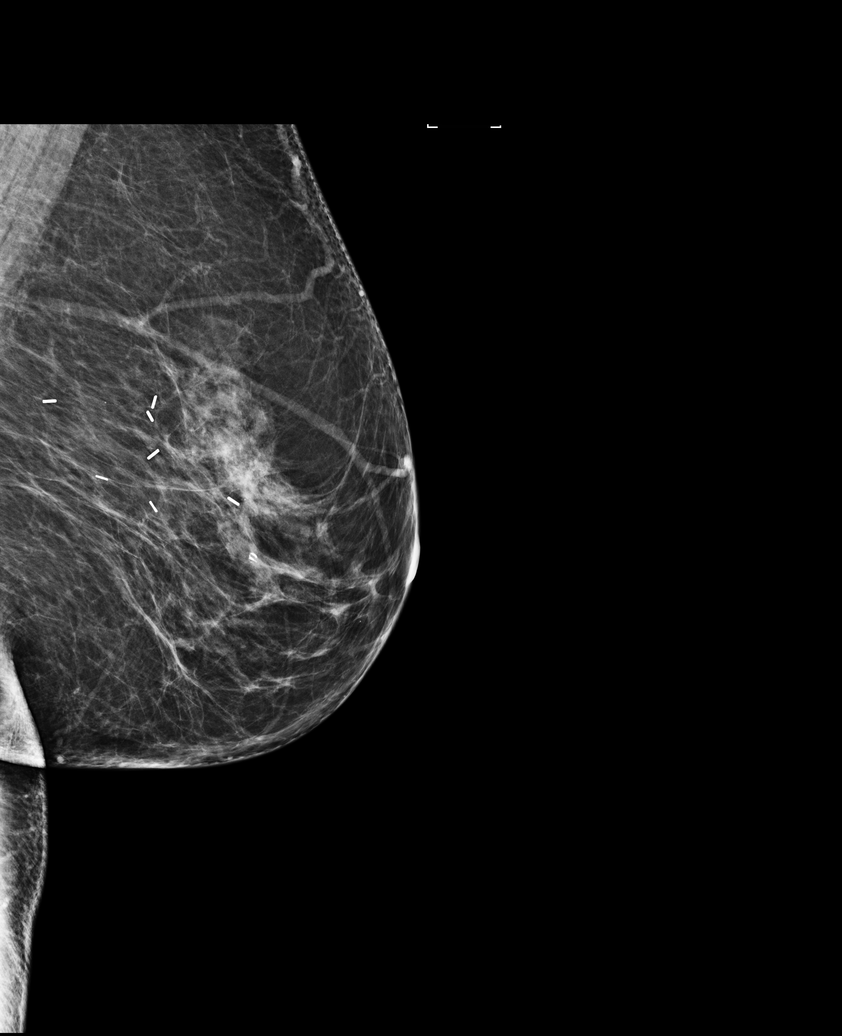

[R MLO]
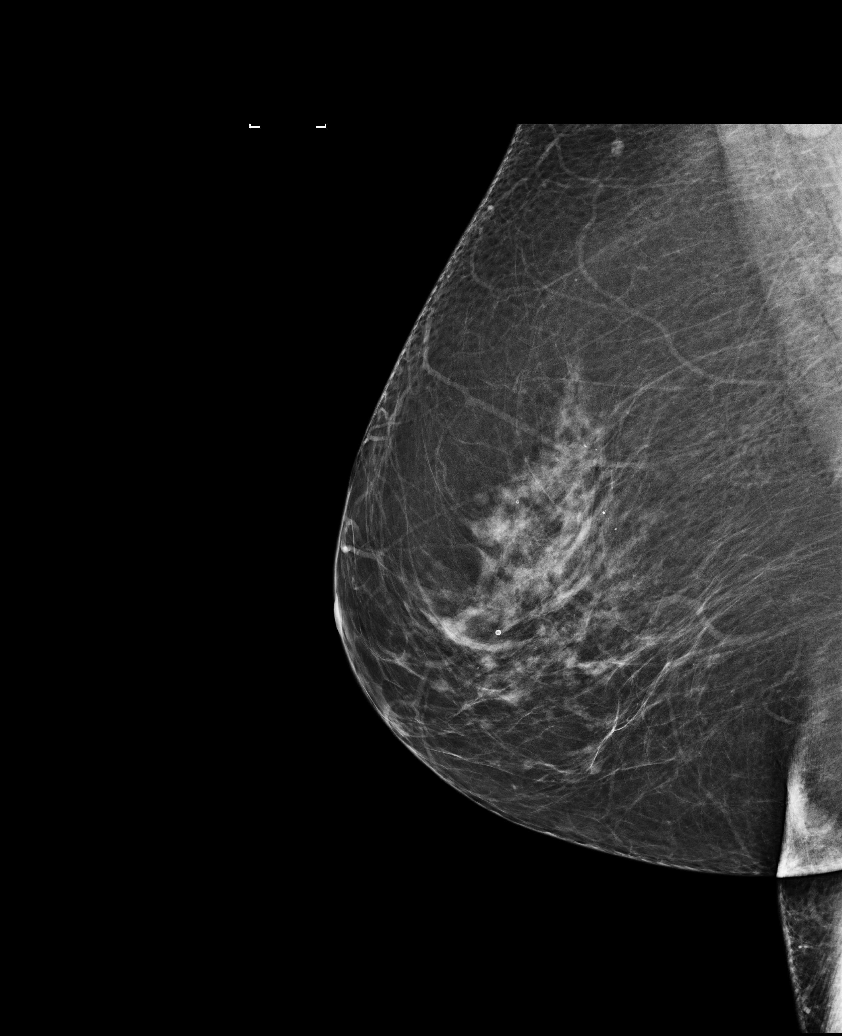

[R CC]
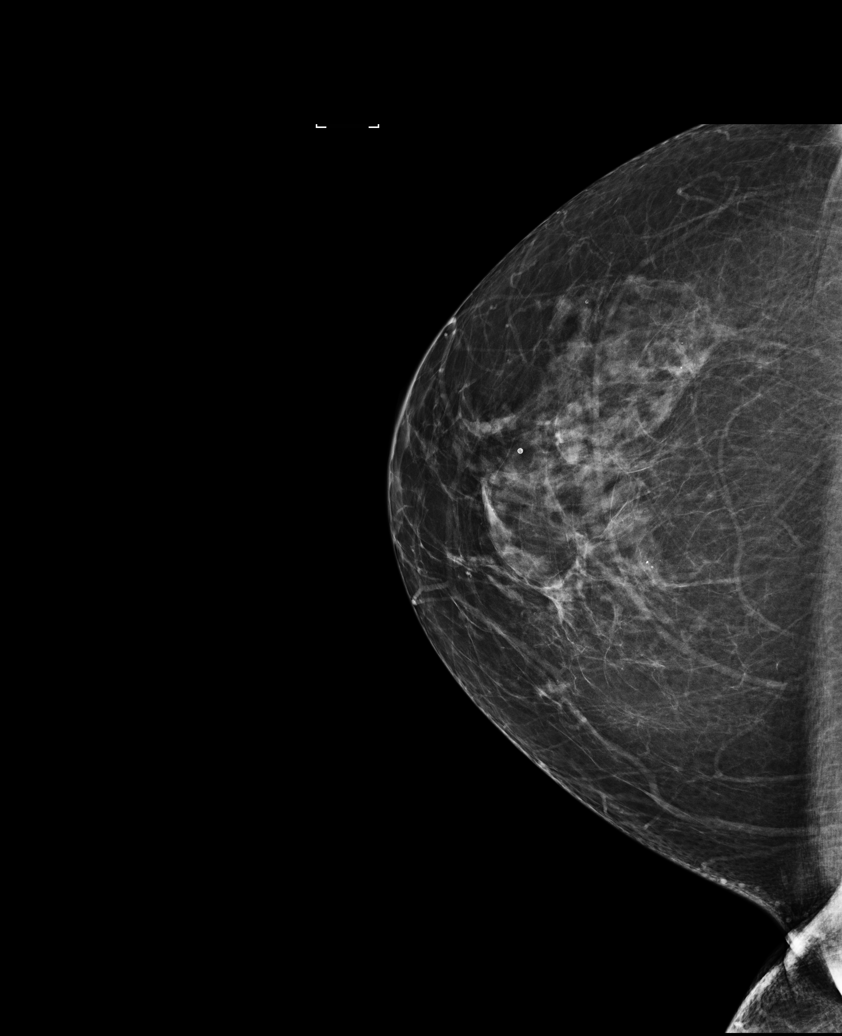

[L CC (3 of 3)]
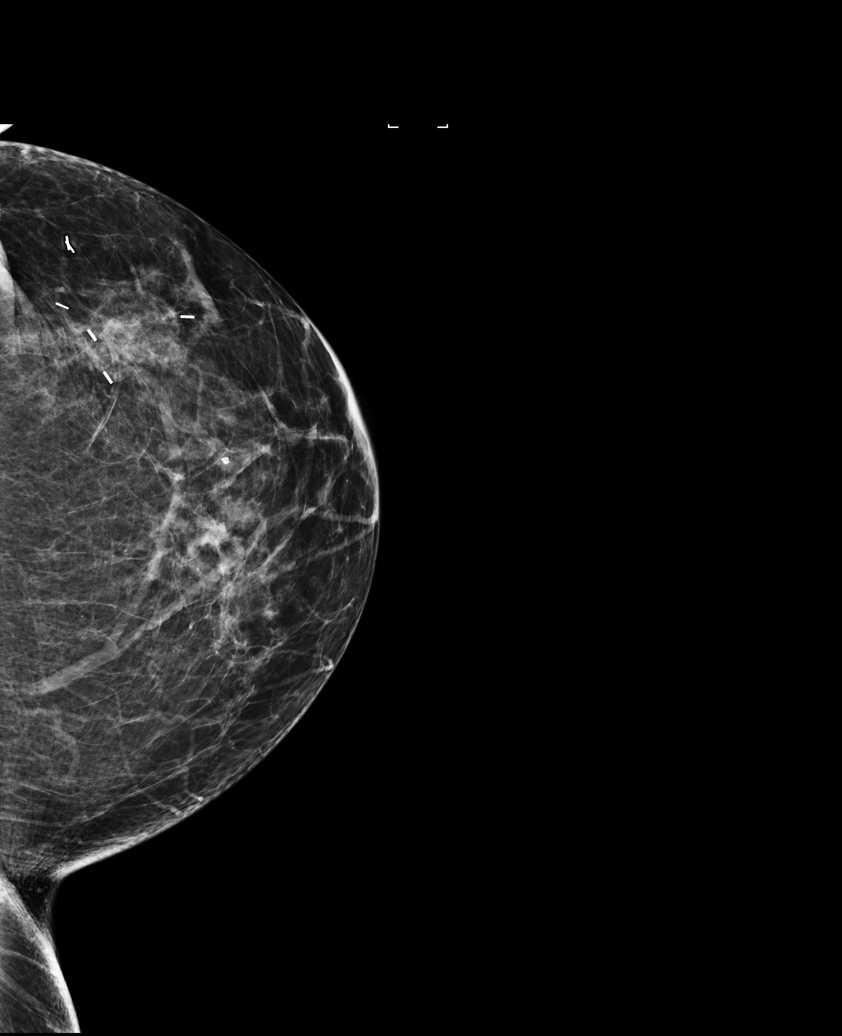

[6 of 26 positions shown; findings below may reference images not displayed]

ACR Breast Density Category b: There are scattered areas of
fibroglandular density.
FINDINGS: Mammographically, there are no suspicious masses, areas of
nonsurgical architectural distortion or microcalcifications in
either breast. Expected post lumpectomy changes are seen in the left
breast upper outer quadrant.

Mammographic images were processed with CAD.
IMPRESSION: No mammographic evidence of malignancy in either breast, status post
left lumpectomy.

RECOMMENDATION:
Diagnostic mammogram is suggested in 1 year. (Code:R8-V-E2V)

I have discussed the findings and recommendations with the patient.
Results were also provided in writing at the conclusion of the
visit. If applicable, a reminder letter will be sent to the patient
regarding the next appointment.

BI-RADS CATEGORY  2: Benign.

## 2016-01-10 ENCOUNTER — Ambulatory Visit (HOSPITAL_COMMUNITY): Payer: 59 | Admitting: Hematology & Oncology

## 2016-01-10 ENCOUNTER — Encounter: Payer: Self-pay | Admitting: Family Medicine

## 2016-01-10 ENCOUNTER — Ambulatory Visit (INDEPENDENT_AMBULATORY_CARE_PROVIDER_SITE_OTHER): Payer: 59 | Admitting: Family Medicine

## 2016-01-10 VITALS — BP 110/72 | Temp 98.6°F | Ht 61.5 in | Wt 160.0 lb

## 2016-01-10 DIAGNOSIS — J019 Acute sinusitis, unspecified: Secondary | ICD-10-CM | POA: Diagnosis not present

## 2016-01-10 DIAGNOSIS — B9689 Other specified bacterial agents as the cause of diseases classified elsewhere: Secondary | ICD-10-CM

## 2016-01-10 MED ORDER — AMOXICILLIN 500 MG PO CAPS: 500.0000 mg | ORAL_CAPSULE | Freq: Three times a day (TID) | ORAL | Status: DC

## 2016-01-10 NOTE — Progress Notes (Signed)
   Subjective:    Patient ID: Janice Higgins, female    DOB: February 18, 1956, 60 y.o.   MRN: [id number]  Sinusitis This is a new problem. Episode onset: 2 weeks. Associated symptoms include ear pain and a sore throat. Treatments tried: advil, benadryl.   Started as an irritated place in the ear  Got progressively worse  Some cong and eerange and cou  occas cough,, more cong in the evand occas cough   Throat irrit in the right lat andt throat   No fever      Review of Systems  HENT: Positive for ear pain and sore throat.        Objective:   Physical Exam Alert, mild malaise. Hydration good Vitals stable. frontal/ maxillary tenderness evident positive nasal congestion. pharynx normal neck supple  lungs clear/no crackles or wheezes. heart regular in rhythm        Assessment & Plan:  Impression rhinosinusitis likely post viral, discussed with patient. plan antibiotics prescribed. Questions answered. Symptomatic care discussed. warning signs discussed. WSL

## 2016-01-15 MED FILL — DICLOFENAC SOD EC 75 MG TAB: 75 | 30 days supply | Qty: 60 | Fill #2

## 2016-02-08 ENCOUNTER — Ambulatory Visit (HOSPITAL_COMMUNITY): Payer: 59 | Admitting: Hematology & Oncology

## 2016-02-08 NOTE — Progress Notes (Signed)
This encounter was created in error - please disregard.

## 2016-02-29 ENCOUNTER — Telehealth: Payer: Self-pay | Admitting: Family Medicine

## 2016-02-29 ENCOUNTER — Other Ambulatory Visit: Payer: Self-pay | Admitting: *Deleted

## 2016-02-29 MED ORDER — AMOXICILLIN-POT CLAVULANATE 875-125 MG PO TABS: 1.0000 | ORAL_TABLET | Freq: Two times a day (BID) | ORAL | 0 refills | Status: DC

## 2016-02-29 NOTE — Telephone Encounter (Signed)
Pt is calling to see if you would call her in an antibiotic  For a bite that she got from one of her dogs a few days ago. She showed it to a hospitalist today and he said that she didn't need to be seen he felt but she probably did need a antibiotic.    Advise you typically do not call this in and that she may need to go to a urgent care, ED or Evisit on My Chart if she can't make it here for an appt. She states they are short staffed and she can not leave work to come to an appt or she will get points    [facility] if sent today or [facility]

## 2016-02-29 NOTE — Telephone Encounter (Signed)
Discussed with pt. Pt states it was her dog and it is up to date on rabies. Med sent to pharm.

## 2016-02-29 NOTE — Telephone Encounter (Signed)
1- augmentin 875-1 bid 7 days 2-make sure dog UTD  On rabies shot-if not she needs to follow up on this via ER/animal control  3- if doesn't get better must be seen here or ER

## 2016-04-03 MED FILL — DICLOFENAC SOD EC 75 MG TAB: 75 | 30 days supply | Qty: 60 | Fill #3

## 2016-06-04 ENCOUNTER — Other Ambulatory Visit (HOSPITAL_COMMUNITY): Payer: Self-pay | Admitting: Hematology & Oncology

## 2016-06-04 DIAGNOSIS — Z9889 Other specified postprocedural states: Secondary | ICD-10-CM

## 2016-06-10 ENCOUNTER — Ambulatory Visit (HOSPITAL_COMMUNITY): Payer: 59

## 2016-06-18 ENCOUNTER — Ambulatory Visit (HOSPITAL_COMMUNITY)
Admission: RE | Admit: 2016-06-18 | Discharge: 2016-06-18 | Disposition: A | Discharge status: Home / Self Care | Payer: 59 | Source: Ambulatory Visit | Attending: Hematology & Oncology | Admitting: Hematology & Oncology

## 2016-06-18 DIAGNOSIS — R928 Other abnormal and inconclusive findings on diagnostic imaging of breast: Secondary | ICD-10-CM | POA: Diagnosis not present

## 2016-06-18 DIAGNOSIS — Z853 Personal history of malignant neoplasm of breast: Secondary | ICD-10-CM | POA: Diagnosis not present

## 2016-06-27 DIAGNOSIS — H524 Presbyopia: Secondary | ICD-10-CM | POA: Diagnosis not present

## 2016-07-23 ENCOUNTER — Other Ambulatory Visit: Payer: Self-pay | Admitting: Family Medicine

## 2016-07-24 NOTE — Telephone Encounter (Signed)
30 day prescription needs office visit

## 2016-07-25 MED FILL — DICLOFENAC SOD 75 MG TAB EC: 75 | 30 days supply | Qty: 60 | Fill #0

## 2016-07-29 ENCOUNTER — Ambulatory Visit (INDEPENDENT_AMBULATORY_CARE_PROVIDER_SITE_OTHER): Payer: 59 | Admitting: Family Medicine

## 2016-07-29 ENCOUNTER — Encounter: Payer: Self-pay | Admitting: Family Medicine

## 2016-07-29 VITALS — BP 122/84 | Temp 98.3°F | Ht 61.5 in | Wt 181.0 lb

## 2016-07-29 DIAGNOSIS — J019 Acute sinusitis, unspecified: Secondary | ICD-10-CM | POA: Diagnosis not present

## 2016-07-29 DIAGNOSIS — B9689 Other specified bacterial agents as the cause of diseases classified elsewhere: Secondary | ICD-10-CM | POA: Diagnosis not present

## 2016-07-29 MED ORDER — AMOXICILLIN 500 MG PO CAPS: 500.0000 mg | ORAL_CAPSULE | Freq: Three times a day (TID) | ORAL | 0 refills | Status: DC

## 2016-07-29 MED ORDER — ALBUTEROL SULFATE HFA 108 (90 BASE) MCG/ACT IN AERS: 2.0000 | INHALATION_SPRAY | RESPIRATORY_TRACT | 0 refills | Status: DC | PRN

## 2016-07-29 MED FILL — VENTOLIN HFA 90 MCG INHALER: 108 (90 BAS | 25 days supply | Qty: 18 | Fill #0

## 2016-07-29 NOTE — Progress Notes (Signed)
   Subjective:    Patient ID: Janice Higgins, female    DOB: 27-Feb-1956, 61 y.o.   MRN: [id number]  Sinusitis  This is a new problem. Episode onset: 15 days. Associated symptoms include congestion, coughing, headaches and a sore throat. (Wheezing,) Treatments tried: mucinex.   Started two wks ago  Took whole bottle of mucines  Pos headache frontal  Bad sneezing  And congestion and gunkiness  Pos bronchial congestion   usin inhaler only as need twice per d ceb ause of jitteriness   Review of Systems  HENT: Positive for congestion and sore throat.   Respiratory: Positive for cough.   Neurological: Positive for headaches.       Objective:   Physical Exam Alert, mild malaise. Hydration good Vitals stable. frontal/ maxillary tenderness evident positive nasal congestion. pharynx normal neck supple  lungs clear/no crackles or wheezes. heart regular in rhythm        Assessment & Plan:  Impression rhinosinusitis likely post viral, discussed with patient. plan antibiotics prescribed. Questions answered. Symptomatic care discussed. warning signs discussed. WSL

## 2016-08-20 ENCOUNTER — Ambulatory Visit (INDEPENDENT_AMBULATORY_CARE_PROVIDER_SITE_OTHER): Payer: 59 | Admitting: Family Medicine

## 2016-08-20 ENCOUNTER — Encounter: Payer: Self-pay | Admitting: Family Medicine

## 2016-08-20 VITALS — Temp 98.5°F | Ht 61.5 in | Wt 183.8 lb

## 2016-08-20 DIAGNOSIS — R300 Dysuria: Secondary | ICD-10-CM | POA: Diagnosis not present

## 2016-08-20 DIAGNOSIS — N3000 Acute cystitis without hematuria: Secondary | ICD-10-CM

## 2016-08-20 LAB — POCT URINALYSIS DIPSTICK
Nitrite, UA: POSITIVE
PH UA: 7
SPEC GRAV UA: 1.02

## 2016-08-20 MED ORDER — CIPROFLOXACIN HCL 500 MG PO TABS: 500.0000 mg | ORAL_TABLET | Freq: Two times a day (BID) | ORAL | 0 refills | Status: DC

## 2016-08-20 NOTE — Progress Notes (Addendum)
   Subjective:    Patient ID: Janice Higgins, female    DOB: Feb 23, 1956, 61 y.o.   MRN: [id number]  HPI  Patient arrives with c/o dysuria for few days.yest incr ur freq  Had dysuria and pos burning  Pos nocturia  Hurt a fair amn  Results for orders placed or performed in visit on 08/20/16  POCT urinalysis dipstick  Result Value Ref Range   Color, UA     Clarity, UA     Glucose, UA     Bilirubin, UA     Ketones, UA     Spec Grav, UA 1.020    Blood, UA trace    pH, UA 7.0    Protein, UA     Urobilinogen, UA     Nitrite, UA positive    Leukocytes, UA moderate (2+) (A) Negative     Slight tinge   No sig fever or chills but felt a bit achey   Patient notes diminished energy slight nausea. No high fevers or chills  Review of Systems No headache, no major weight loss or weight gain, no chest pain no back pain abdominal pain no change in bowel habits complete ROS otherwise negative     Objective:   Physical Exam  Alert vitals stable, NAD. Blood pressure good on repeat. HEENT normal. Lungs clear. Heart regular rate and rhythm. No true CVA tenderness  Urinalysis numerous white blood cells per high-power field      Assessment & Plan:  Impression urinary tract infection with some systemic features plan Cipro and full dose 500 twice a day 7 days local measures discussed hydration. Warning signs discussed

## 2016-08-20 NOTE — Patient Instructions (Signed)
May get some otc azostandard

## 2016-10-01 ENCOUNTER — Other Ambulatory Visit: Payer: Self-pay | Admitting: Family Medicine

## 2016-10-01 MED FILL — DICLOFENAC SOD 75 MG TAB EC: 75 | 30 days supply | Qty: 60 | Fill #0

## 2016-11-25 ENCOUNTER — Other Ambulatory Visit: Payer: Self-pay | Admitting: Family Medicine

## 2016-11-25 MED FILL — DICLOFENAC SOD 75 MG TAB EC: 75 | 30 days supply | Qty: 60 | Fill #0

## 2016-11-26 ENCOUNTER — Ambulatory Visit (INDEPENDENT_AMBULATORY_CARE_PROVIDER_SITE_OTHER): Payer: 59 | Admitting: Family Medicine

## 2016-11-26 ENCOUNTER — Encounter: Payer: Self-pay | Admitting: Family Medicine

## 2016-11-26 VITALS — BP 122/82 | Temp 98.2°F | Ht 61.5 in | Wt 174.0 lb

## 2016-11-26 DIAGNOSIS — B9689 Other specified bacterial agents as the cause of diseases classified elsewhere: Secondary | ICD-10-CM | POA: Diagnosis not present

## 2016-11-26 DIAGNOSIS — J019 Acute sinusitis, unspecified: Secondary | ICD-10-CM

## 2016-11-26 MED ORDER — DICLOFENAC SODIUM 75 MG PO TBEC: DELAYED_RELEASE_TABLET | ORAL | 6 refills | Status: DC

## 2016-11-26 MED ORDER — AMOXICILLIN 500 MG PO CAPS: 500.0000 mg | ORAL_CAPSULE | Freq: Three times a day (TID) | ORAL | 0 refills | Status: DC

## 2016-11-26 MED FILL — AMOXICILLIN 500 MG CAPSULE: 500 | 10 days supply | Qty: 30 | Fill #0

## 2016-11-26 NOTE — Progress Notes (Signed)
   Subjective:    Patient ID: Janice Higgins, female    DOB: 01/23/1956, 61 y.o.   MRN: [id number]  Sinus Problem  This is a new problem. The current episode started in the past 7 days. Associated symptoms include congestion, coughing, ear pain, headaches, sinus pressure and a sore throat. Pertinent negatives include no shortness of breath. Treatments tried: mucinex and benadryl.  PMH benign    Review of Systems  Constitutional: Negative for activity change and fever.  HENT: Positive for congestion, ear pain, rhinorrhea, sinus pressure and sore throat.   Eyes: Negative for discharge.  Respiratory: Positive for cough. Negative for shortness of breath and wheezing.   Cardiovascular: Negative for chest pain.  Neurological: Positive for headaches.       Objective:   Physical Exam  Constitutional: She appears well-developed.  HENT:  Head: Normocephalic.  Nose: Nose normal.  Mouth/Throat: Oropharynx is clear and moist. No oropharyngeal exudate.  Neck: Neck supple.  Cardiovascular: Normal rate and normal heart sounds.   No murmur heard. Pulmonary/Chest: Effort normal and breath sounds normal. She has no wheezes.  Lymphadenopathy:    She has no cervical adenopathy.  Skin: Skin is warm and dry.  Nursing note and vitals reviewed.  Moderate sinus tenderness on the left side patient not toxic       Assessment & Plan:  Viral syndrome secondary rhinosinusitis antibiotics prescribed Allergy medicine when necessary loratadine

## 2017-01-14 MED FILL — DICLOFENAC SOD 75 MG TAB EC: 75 | 30 days supply | Qty: 60 | Fill #0

## 2017-03-18 MED FILL — DICLOFENAC SOD 75 MG TAB EC: 75 | 30 days supply | Qty: 60 | Fill #1

## 2017-06-02 MED FILL — DICLOFENAC SODIUM 75 MG TAB: 75 | 30 days supply | Qty: 60 | Fill #2

## 2017-06-13 ENCOUNTER — Telehealth: Payer: Self-pay | Admitting: Family Medicine

## 2017-06-13 ENCOUNTER — Other Ambulatory Visit: Payer: Self-pay | Admitting: *Deleted

## 2017-06-13 ENCOUNTER — Other Ambulatory Visit: Payer: Self-pay | Admitting: Family Medicine

## 2017-06-13 DIAGNOSIS — Z853 Personal history of malignant neoplasm of breast: Secondary | ICD-10-CM

## 2017-06-13 NOTE — Telephone Encounter (Signed)
Pt requesting Dr. Richardson Landry to order a Diagnostic Mammogram Pt states she has to have diagnostic due to history of breast cancer & was told we had to order  Also pt has gotten her nails done a couple times here recently and the pharmacist at [facility] recommended pt asking [place] to order her something for fungus Pt states she has fungus under her fingenails  Call something in or NTBS?  Please advise & call pt    [facility]

## 2017-06-13 NOTE — Telephone Encounter (Signed)
Lets order mammo  o v necessary for fungus eval because not always fungus and meds can cause side effects

## 2017-06-13 NOTE — Telephone Encounter (Signed)
Discussed with pt. Pt verbalized understanding and she will call back to schedule office visit for fungus. Diagnostic mammo scheduled for dec 11th at 2:20 arrive 2:10. Pt notified.

## 2017-06-24 ENCOUNTER — Ambulatory Visit (HOSPITAL_COMMUNITY)
Admission: RE | Admit: 2017-06-24 | Discharge: 2017-06-24 | Disposition: A | Discharge status: Home / Self Care | Payer: 59 | Source: Ambulatory Visit | Attending: Family Medicine | Admitting: Family Medicine

## 2017-06-24 ENCOUNTER — Encounter (HOSPITAL_COMMUNITY): Payer: Self-pay

## 2017-06-24 DIAGNOSIS — Z853 Personal history of malignant neoplasm of breast: Secondary | ICD-10-CM | POA: Insufficient documentation

## 2017-06-24 DIAGNOSIS — R928 Other abnormal and inconclusive findings on diagnostic imaging of breast: Secondary | ICD-10-CM | POA: Diagnosis not present

## 2017-07-01 ENCOUNTER — Ambulatory Visit (INDEPENDENT_AMBULATORY_CARE_PROVIDER_SITE_OTHER): Payer: 59 | Admitting: Family Medicine

## 2017-07-01 ENCOUNTER — Encounter: Payer: Self-pay | Admitting: Family Medicine

## 2017-07-01 VITALS — BP 120/82 | Temp 98.1°F | Ht 61.5 in | Wt 172.0 lb

## 2017-07-01 DIAGNOSIS — R300 Dysuria: Secondary | ICD-10-CM

## 2017-07-01 DIAGNOSIS — N390 Urinary tract infection, site not specified: Secondary | ICD-10-CM

## 2017-07-01 DIAGNOSIS — B351 Tinea unguium: Secondary | ICD-10-CM | POA: Diagnosis not present

## 2017-07-01 DIAGNOSIS — N3001 Acute cystitis with hematuria: Secondary | ICD-10-CM

## 2017-07-01 LAB — POCT URINALYSIS DIPSTICK
Nitrite, UA: POSITIVE
PH UA: 6 (ref 5.0–8.0)
Spec Grav, UA: 1.025 (ref 1.010–1.025)

## 2017-07-01 MED ORDER — CEFPROZIL 500 MG PO TABS: 500.0000 mg | ORAL_TABLET | Freq: Two times a day (BID) | ORAL | 0 refills | Status: DC

## 2017-07-01 NOTE — Progress Notes (Signed)
   Subjective:    Patient ID: Janice Higgins, female    DOB: July 15, 1956, 61 y.o.   MRN: [id number]  HPI  Patient is here today for fungus on her finger and Urinary tract symptoms,hurts/burns to urinate,started yesterday.She is taking Tylenol for this. Urinary symptoms over the past 24 hours no fever chills sweats or flank pain  sReview of Systems    Objective:   Physical Exam On finger exam there is thickened nails plus also dystrophic nails it is hard to know if this is fungus or if there is another measure going on here  She denies flank pain severe abdominal pain denies nausea vomiting diarrhea she is in no distress    Urinalysis shows WBCs and RBCs Assessment & Plan:  UTI-urine culture sent Antibiotic prescribed If progressive symptoms or if worse follow-up  Recommend follow-up visit 3-4 weeks for repeat urinalysis to make sure there are no RBCs  Possible nail fungus recommend clippings to be sent for further evaluation may end up needing the Lamisil this was discussed briefly with the patient she seemed on board but she understands we need to wait until nail culture comes back she will do clippings then send these for a nail culture

## 2017-07-03 LAB — URINE CULTURE

## 2017-07-25 MED FILL — DICLOFENAC SODIUM 75 MG TAB: 75 | 30 days supply | Qty: 60 | Fill #3

## 2017-07-28 ENCOUNTER — Ambulatory Visit: Payer: 59 | Admitting: Family Medicine

## 2017-07-28 ENCOUNTER — Other Ambulatory Visit: Payer: Self-pay | Admitting: Family Medicine

## 2017-07-28 ENCOUNTER — Encounter: Payer: Self-pay | Admitting: Family Medicine

## 2017-07-28 VITALS — BP 124/90 | Temp 98.5°F | Ht 61.5 in | Wt 172.0 lb

## 2017-07-28 DIAGNOSIS — Z7689 Persons encountering health services in other specified circumstances: Secondary | ICD-10-CM | POA: Diagnosis not present

## 2017-07-28 DIAGNOSIS — R300 Dysuria: Secondary | ICD-10-CM

## 2017-07-28 DIAGNOSIS — B351 Tinea unguium: Secondary | ICD-10-CM | POA: Diagnosis not present

## 2017-07-28 LAB — POCT URINALYSIS DIPSTICK
NITRITE UA: POSITIVE
Spec Grav, UA: 1.025 (ref 1.010–1.025)
pH, UA: 5 (ref 5.0–8.0)

## 2017-07-28 NOTE — Progress Notes (Signed)
  Patient follows up for numerous reasons. Had a urinary tract infection.  There is substantial blood cell.  Plus red blood cells.  Took antibiotics.  Now states overall feeling better.  Also had nail changes of the hand.  The still persists.  Concerned about fungus.  Brings in nail clippings today.  ROS otherwise negative  Alert vitals stable, NAD. Blood pressure good on repeat. HEENT normal. Lungs clear. Heart regular rate and rhythm. Positive epithelial cells rare white blood cell  Urinalysis rare bacteria No need for further treatment discussed with patient Fingernails still have evident hypertrophic changes await fungal culture results  Greater than 50% of this 15 minute face to face visit was spent in counseling and discussion and coordination of care regarding the above diagnosis/diagnosies

## 2017-07-28 NOTE — Progress Notes (Signed)
   Subjective:    Patient ID: Janice Higgins, female    DOB: 30-May-1956, 62 y.o.   MRN: [id number]  HPI  Patient is here today for a four week follow up on urinalysis to make sure there are no RBC seen on microscope.patient also brought back her nail samples so we could run the fungus cx. Review of Systems Results for orders placed or performed in visit on 07/28/17  POCT urinalysis dipstick  Result Value Ref Range   Color, UA     Clarity, UA     Glucose, UA     Bilirubin, UA     Ketones, UA     Spec Grav, UA 1.025 1.010 - 1.025   Blood, UA Trace    pH, UA 5.0 5.0 - 8.0   Protein, UA     Urobilinogen, UA  0.2 or 1.0 E.U./dL   Nitrite, UA Positive    Leukocytes, UA Small (1+) (A) Negative   Appearance     Odor         Objective:   Physical Exam        Assessment & Plan:

## 2017-07-31 LAB — SPECIMEN STATUS REPORT

## 2017-08-23 LAB — SPECIMEN STATUS REPORT

## 2017-08-23 LAB — FUNGUS CULTURE, BLOOD

## 2017-08-26 DIAGNOSIS — H524 Presbyopia: Secondary | ICD-10-CM | POA: Diagnosis not present

## 2017-09-22 ENCOUNTER — Ambulatory Visit: Payer: 59 | Admitting: Family Medicine

## 2017-09-22 ENCOUNTER — Encounter: Payer: Self-pay | Admitting: Family Medicine

## 2017-09-22 VITALS — BP 138/88 | Temp 98.1°F | Ht 61.5 in | Wt 169.0 lb

## 2017-09-22 DIAGNOSIS — B351 Tinea unguium: Secondary | ICD-10-CM | POA: Diagnosis not present

## 2017-09-22 DIAGNOSIS — J019 Acute sinusitis, unspecified: Secondary | ICD-10-CM | POA: Diagnosis not present

## 2017-09-22 DIAGNOSIS — B9689 Other specified bacterial agents as the cause of diseases classified elsewhere: Secondary | ICD-10-CM | POA: Diagnosis not present

## 2017-09-22 MED ORDER — PREDNISONE 10 MG PO TABS: ORAL_TABLET | ORAL | 0 refills | Status: DC

## 2017-09-22 MED ORDER — AZITHROMYCIN 250 MG PO TABS: ORAL_TABLET | ORAL | 0 refills | Status: DC

## 2017-09-22 NOTE — Progress Notes (Signed)
   Subjective:    Patient ID: Janice Higgins, female    DOB: 1956/01/13, 62 y.o.   MRN: [id number]  Cough  This is a new problem. Episode onset: 3 days. Associated symptoms comments: Sob, wheezing, headache, sore throat.   Pt wants to discuss nail fungus. Pt had fungus culture done in January. Discussed result note with pt. Would like to discuss starting lamisil.   Patient  has been under a lot of stress recently dementia.  Developed cough.  Then substantial wheezing.  Cough is productive of yellowish phlegm.  Also headache frontal in nature with positive nasal discharge  Review of Systems  Respiratory: Positive for cough.        Objective:   Physical Exam  Alert, mild malaise. Hydration good Vitals stable. frontal/ maxillary tenderness evident positive nasal congestion. pharynx normal neck supple  lungs clear/no crackles also positive wheezes. heart regular in rhythm       Assessment & Plan:  Impression rhinosinusitis/bronchitis with reactive airway discussed nail culture.  Positive for fungus.  After discussing side effects patient wishes to hold off on intervention at this time likely post viral, discussed with patient. plan antibiotics prescribed. Questions answered. Symptomatic care discussed. warning signs discussed. WSL

## 2017-09-29 MED FILL — DICLOFENAC SODIUM 75 MG TAB: 75 | 30 days supply | Qty: 60 | Fill #4

## 2017-10-12 ENCOUNTER — Encounter: Payer: Self-pay | Admitting: Family Medicine

## 2017-10-12 ENCOUNTER — Telehealth: Payer: Self-pay | Admitting: Family Medicine

## 2017-10-12 NOTE — Telephone Encounter (Signed)
Please mail the letter to the patient.  If this patient wants to start treatment for her nail fungus she will need an office visit.  Letter was dictated today.

## 2017-11-21 ENCOUNTER — Telehealth: Payer: Self-pay | Admitting: Nurse Practitioner

## 2017-11-21 DIAGNOSIS — Z13220 Encounter for screening for lipoid disorders: Secondary | ICD-10-CM

## 2017-11-21 DIAGNOSIS — Z0000 Encounter for general adult medical examination without abnormal findings: Secondary | ICD-10-CM

## 2017-11-21 NOTE — Telephone Encounter (Signed)
Pt has Physical here 12/15/2017 - please order labs for pt to get done before her visit   Please advise & call pt

## 2017-11-23 NOTE — Telephone Encounter (Signed)
Lip liv m7 cbc tsh 

## 2017-11-24 NOTE — Telephone Encounter (Signed)
Orders put in. Pt notified.  

## 2017-11-26 ENCOUNTER — Other Ambulatory Visit (HOSPITAL_COMMUNITY)
Admission: RE | Admit: 2017-11-26 | Discharge: 2017-11-26 | Disposition: A | Discharge status: Home / Self Care | Payer: 59 | Source: Ambulatory Visit | Attending: Gastroenterology | Admitting: Gastroenterology

## 2017-11-26 DIAGNOSIS — Z0000 Encounter for general adult medical examination without abnormal findings: Secondary | ICD-10-CM | POA: Diagnosis not present

## 2017-11-26 DIAGNOSIS — Z13220 Encounter for screening for lipoid disorders: Secondary | ICD-10-CM | POA: Diagnosis not present

## 2017-11-26 LAB — HEPATIC FUNCTION PANEL
ALT: 47 U/L (ref 14–54)
AST: 31 U/L (ref 15–41)
Albumin: 4.2 g/dL (ref 3.5–5.0)
Alkaline Phosphatase: 54 U/L (ref 38–126)
BILIRUBIN DIRECT: 0.1 mg/dL (ref 0.1–0.5)
BILIRUBIN INDIRECT: 0.6 mg/dL (ref 0.3–0.9)
Total Bilirubin: 0.7 mg/dL (ref 0.3–1.2)
Total Protein: 7.5 g/dL (ref 6.5–8.1)

## 2017-11-26 LAB — BASIC METABOLIC PANEL
ANION GAP: 8 (ref 5–15)
BUN: 21 mg/dL — ABNORMAL HIGH (ref 6–20)
CALCIUM: 9.7 mg/dL (ref 8.9–10.3)
CO2: 28 mmol/L (ref 22–32)
Chloride: 105 mmol/L (ref 101–111)
Creatinine, Ser: 0.59 mg/dL (ref 0.44–1.00)
Glucose, Bld: 102 mg/dL — ABNORMAL HIGH (ref 65–99)
Potassium: 4.3 mmol/L (ref 3.5–5.1)
Sodium: 141 mmol/L (ref 135–145)

## 2017-11-26 LAB — CBC WITH DIFFERENTIAL/PLATELET
BASOS ABS: 0 10*3/uL (ref 0.0–0.1)
BASOS PCT: 1 %
Eosinophils Absolute: 0.2 10*3/uL (ref 0.0–0.7)
Eosinophils Relative: 4 %
HCT: 43.2 % (ref 36.0–46.0)
HEMOGLOBIN: 14.1 g/dL (ref 12.0–15.0)
Lymphocytes Relative: 31 %
Lymphs Abs: 2 10*3/uL (ref 0.7–4.0)
MCH: 30.4 pg (ref 26.0–34.0)
MCHC: 32.6 g/dL (ref 30.0–36.0)
MCV: 93.1 fL (ref 78.0–100.0)
Monocytes Absolute: 0.5 10*3/uL (ref 0.1–1.0)
Monocytes Relative: 7 %
NEUTROS ABS: 3.6 10*3/uL (ref 1.7–7.7)
NEUTROS PCT: 57 %
Platelets: 314 10*3/uL (ref 150–400)
RBC: 4.64 MIL/uL (ref 3.87–5.11)
RDW: 13.7 % (ref 11.5–15.5)
WBC: 6.3 10*3/uL (ref 4.0–10.5)

## 2017-11-26 LAB — TSH: TSH: 2.418 u[IU]/mL (ref 0.350–4.500)

## 2017-11-26 LAB — LIPID PANEL
CHOL/HDL RATIO: 2.6 ratio
CHOLESTEROL: 174 mg/dL (ref 0–200)
HDL: 66 mg/dL (ref >40)
LDL Cholesterol: 91 mg/dL (ref 0–99)
Triglycerides: 84 mg/dL (ref <150)
VLDL: 17 mg/dL (ref 0–40)

## 2017-12-15 ENCOUNTER — Ambulatory Visit (INDEPENDENT_AMBULATORY_CARE_PROVIDER_SITE_OTHER): Payer: 59 | Admitting: Nurse Practitioner

## 2017-12-15 ENCOUNTER — Encounter: Payer: Self-pay | Admitting: Nurse Practitioner

## 2017-12-15 VITALS — BP 128/90 | Ht 61.5 in | Wt 177.0 lb

## 2017-12-15 DIAGNOSIS — Z124 Encounter for screening for malignant neoplasm of cervix: Secondary | ICD-10-CM

## 2017-12-15 DIAGNOSIS — Z1151 Encounter for screening for human papillomavirus (HPV): Secondary | ICD-10-CM | POA: Diagnosis not present

## 2017-12-15 DIAGNOSIS — Z780 Asymptomatic menopausal state: Secondary | ICD-10-CM

## 2017-12-15 DIAGNOSIS — Z01419 Encounter for gynecological examination (general) (routine) without abnormal findings: Secondary | ICD-10-CM

## 2017-12-15 DIAGNOSIS — Z0000 Encounter for general adult medical examination without abnormal findings: Secondary | ICD-10-CM | POA: Diagnosis not present

## 2017-12-17 ENCOUNTER — Encounter: Payer: Self-pay | Admitting: Nurse Practitioner

## 2017-12-17 NOTE — Progress Notes (Signed)
Subjective:    Patient ID: Janice Higgins, female    DOB: [birth year], 62 y.o.   MRN: [id number]  HPI presents for her wellness exam. No vaginal bleeding. Same sexual partner. Regular vision and dental exams. Active lifestyle. Trying to eat healthier. Wants to lose weight. May be going back to [employer] where she has had success in the past.     Review of Systems  Constitutional: Positive for fatigue. Negative for activity change and appetite change.       Has noticed increased fatigue as she has gained weight.   HENT: Negative for dental problem, ear pain, sinus pressure and sore throat.   Respiratory: Negative for cough, chest tightness, shortness of breath and wheezing.   Cardiovascular: Negative for chest pain.  Gastrointestinal: Negative for abdominal distention, abdominal pain, blood in stool, constipation, diarrhea, nausea and vomiting.  Genitourinary: Negative for difficulty urinating, dysuria, enuresis, frequency, genital sores, pelvic pain, urgency, vaginal bleeding and vaginal discharge.       Objective:   Physical Exam  Constitutional: She is oriented to person, place, and time. She appears well-developed. No distress.  HENT:  Right Ear: External ear normal.  Left Ear: External ear normal.  Mouth/Throat: Oropharynx is clear and moist.  Neck: Normal range of motion. Neck supple. No tracheal deviation present. No thyromegaly present.  Cardiovascular: Normal rate, regular rhythm and normal heart sounds. Exam reveals no gallop.  No murmur heard. Pulmonary/Chest: Effort normal and breath sounds normal. Right breast exhibits no inverted nipple, no mass, no skin change and no tenderness. Left breast exhibits no inverted nipple, no mass, no skin change and no tenderness. Breasts are symmetrical.  Slight scar left breat from previous surgery. Axillae no adenopathy.   Abdominal: Soft. She exhibits no distension. There is no tenderness.  Genitourinary: Vagina normal and uterus  normal. No vaginal discharge found.  Genitourinary Comments: External GU: no rashes or lesions. Vagina: no discharge. Bimanual exam: no tenderness or obvious masses.   Musculoskeletal: She exhibits no edema.  Lymphadenopathy:    She has no cervical adenopathy.  Neurological: She is alert and oriented to person, place, and time.  Skin: Skin is warm and dry. No rash noted.  Psychiatric: She has a normal mood and affect. Her behavior is normal.   Results for orders placed or performed during the hospital encounter of 11/26/17  Hepatic function panel  Result Value Ref Range   Total Protein 7.5 6.5 - 8.1 g/dL   Albumin 4.2 3.5 - 5.0 g/dL   AST 31 15 - 41 U/L   ALT 47 14 - 54 U/L   Alkaline Phosphatase 54 38 - 126 U/L   Total Bilirubin 0.7 0.3 - 1.2 mg/dL   Bilirubin, Direct 0.1 0.1 - 0.5 mg/dL   Indirect Bilirubin 0.6 0.3 - 0.9 mg/dL  Basic metabolic panel  Result Value Ref Range   Sodium 141 135 - 145 mmol/L   Potassium 4.3 3.5 - 5.1 mmol/L   Chloride 105 101 - 111 mmol/L   CO2 28 22 - 32 mmol/L   Glucose, Bld 102 (H) 65 - 99 mg/dL   BUN 21 (H) 6 - 20 mg/dL   Creatinine, Ser 0.59 0.44 - 1.00 mg/dL   Calcium 9.7 8.9 - 10.3 mg/dL   GFR calc non Af Amer >60 >60 mL/min   GFR calc Af Amer >60 >60 mL/min   Anion gap 8 5 - 15  CBC with Differential/Platelet  Result Value Ref Range   WBC 6.3  4.0 - 10.5 K/uL   RBC 4.64 3.87 - 5.11 MIL/uL   Hemoglobin 14.1 12.0 - 15.0 g/dL   HCT 43.2 36.0 - 46.0 %   MCV 93.1 78.0 - 100.0 fL   MCH 30.4 26.0 - 34.0 pg   MCHC 32.6 30.0 - 36.0 g/dL   RDW 13.7 11.5 - 15.5 %   Platelets 314 150 - 400 K/uL   Neutrophils Relative % 57 %   Neutro Abs 3.6 1.7 - 7.7 K/uL   Lymphocytes Relative 31 %   Lymphs Abs 2.0 0.7 - 4.0 K/uL   Monocytes Relative 7 %   Monocytes Absolute 0.5 0.1 - 1.0 K/uL   Eosinophils Relative 4 %   Eosinophils Absolute 0.2 0.0 - 0.7 K/uL   Basophils Relative 1 %   Basophils Absolute 0.0 0.0 - 0.1 K/uL  TSH  Result Value Ref Range    TSH 2.418 0.350 - 4.500 uIU/mL  Lipid panel  Result Value Ref Range   Cholesterol 174 0 - 200 mg/dL   Triglycerides 84 <150 mg/dL   HDL 66 >40 mg/dL   Total CHOL/HDL Ratio 2.6 RATIO   VLDL 17 0 - 40 mg/dL   LDL Cholesterol 91 0 - 99 mg/dL   Results reviewed with patient. FBS slightly elevated.         Assessment & Plan:   Problem List Items Addressed This Visit    None    Visit Diagnoses    Well woman exam    -  Primary   Relevant Orders   Pap IG and HPV (high risk) DNA detection   Screening for cervical cancer       Relevant Orders   Pap IG and HPV (high risk) DNA detection   Screening for HPV (human papillomavirus)       Relevant Orders   Pap IG and HPV (high risk) DNA detection   Postmenopausal       Relevant Orders   DG Bone Density     Encouraged regular activity, healthy diet and weight loss. Recommend daily vitamin D supplement.  Diagnostic mammogram due in December.  Return in about 1 year (around 12/16/2018) for physical.

## 2017-12-18 ENCOUNTER — Telehealth: Payer: Self-pay | Admitting: Family Medicine

## 2017-12-18 LAB — PAP IG AND HPV HIGH-RISK
HPV, HIGH-RISK: NEGATIVE
PAP Smear Comment: 0

## 2017-12-18 NOTE — Telephone Encounter (Signed)
Review pap results from [place] in yellow folder.

## 2017-12-19 NOTE — Telephone Encounter (Signed)
Left message to return call 

## 2017-12-19 NOTE — Telephone Encounter (Signed)
Normal PAP with neg HPV test.

## 2017-12-19 NOTE — Telephone Encounter (Signed)
Patient is aware 

## 2017-12-25 ENCOUNTER — Ambulatory Visit (HOSPITAL_COMMUNITY)
Admission: RE | Admit: 2017-12-25 | Discharge: 2017-12-25 | Disposition: A | Discharge status: Home / Self Care | Payer: 59 | Source: Ambulatory Visit | Attending: Nurse Practitioner | Admitting: Nurse Practitioner

## 2017-12-25 DIAGNOSIS — M85851 Other specified disorders of bone density and structure, right thigh: Secondary | ICD-10-CM | POA: Diagnosis not present

## 2017-12-25 DIAGNOSIS — Z780 Asymptomatic menopausal state: Secondary | ICD-10-CM | POA: Insufficient documentation

## 2018-01-13 ENCOUNTER — Other Ambulatory Visit: Payer: Self-pay | Admitting: Family Medicine

## 2018-01-13 MED FILL — DICLOFENAC SODIUM 75 MG TAB: 75 | 30 days supply | Qty: 60 | Fill #0

## 2018-02-03 ENCOUNTER — Ambulatory Visit: Payer: 59 | Admitting: Family Medicine

## 2018-02-03 ENCOUNTER — Encounter: Payer: Self-pay | Admitting: Family Medicine

## 2018-02-03 VITALS — BP 132/90 | Temp 98.2°F | Ht 61.5 in | Wt 178.4 lb

## 2018-02-03 DIAGNOSIS — M25561 Pain in right knee: Secondary | ICD-10-CM | POA: Diagnosis not present

## 2018-02-03 DIAGNOSIS — R21 Rash and other nonspecific skin eruption: Secondary | ICD-10-CM | POA: Diagnosis not present

## 2018-02-03 MED ORDER — PREDNISONE 20 MG PO TABS
ORAL_TABLET | ORAL | 0 refills | Status: DC
Start: 2018-02-03 — End: 2018-12-21

## 2018-02-03 MED ORDER — PREDNISONE 20 MG PO TABS: ORAL_TABLET | ORAL | 0 refills | Status: DC

## 2018-02-03 NOTE — Progress Notes (Signed)
   Subjective:    Patient ID: Janice Higgins, female    DOB: 11/10/1955, 62 y.o.   MRN: [id number] -Patient arrives with several concerns, over the past week.  Has had some pain in this knee in the past.  Knee Pain    The incident occurred more than 1 week ago. The pain is present in the right knee. Treatments tried: Voltaren.  The treatment provided mild relief.   Rash   This is a new problem. The current episode started 1 to 4 weeks ago. The affected locations include the left arm, right arm, left lower leg and right lower leg.  The rash is characterized by itchiness and redness. Associated with: Husband and son had poison oak; maybe got a hold of a rag or towel that they used.  Past treatments include antihistamine and anti-itch cream (Advil).    Deep pain in the right knee overall has done okay.  Recently no more than substantial pain.  Recalls no sudden injury.  Recalls no over the years.  Over-the-counter medications really not helping  Patient was exposed to poison oak poison ivy developed a rash quite pruritic in nature.  Patient had a lot of difficulty with dealing with the death of her father.  There are circumstances that occurred in the final days including I thought the hospital which are leaving her with several unresolved issues that are causing her some distress.  States overall feeling better about it but still not ideal  Recalls no sudden injruy    Review of Systems  Skin: Positive for rash.       Objective:   Physical Exam  Alert and oriented, vitals reviewed and stable, NAD ENT-TM's and ext canals WNL bilat via otoscopic exam Soft palate, tonsils and post pharynx WNL via oropharyngeal exam Neck-symmetric, no masses; thyroid nonpalpable and nontender Pulmonary-no tachypnea or accessory muscle use; Clear without wheezes via auscultation Card--no abnrml murmurs, rhythm reg and rate WNL Carotid pulses symmetric, without bruits Right knee positive crepitations slight  effusion present no joint laxity no joint line tenderness  Rash reveals discrete patchy excoriations      Assessment & Plan:  1 impression right knee arthritis.  Prednisone taper to be given for problem #2.  Likely help problem #1.  Recommend x-ray.  Extent of findings will lead to further recommendations potential orthopedic referral discussed  2.  Heart HUS dermatitis.  Discussed.  Will cover with prednisone taper  3.  Frustration regarding father's death along with challenges regarding the see above  Greater than 50% of this 25 minute face to face visit was spent in counseling and discussion and coordination of care regarding the above diagnosis/diagnosies

## 2018-02-04 ENCOUNTER — Ambulatory Visit (HOSPITAL_COMMUNITY)
Admission: RE | Admit: 2018-02-04 | Discharge: 2018-02-04 | Disposition: A | Discharge status: Home / Self Care | Payer: 59 | Source: Ambulatory Visit | Attending: Family Medicine | Admitting: Family Medicine

## 2018-02-04 DIAGNOSIS — M1711 Unilateral primary osteoarthritis, right knee: Secondary | ICD-10-CM | POA: Diagnosis not present

## 2018-02-04 DIAGNOSIS — M25561 Pain in right knee: Secondary | ICD-10-CM | POA: Diagnosis not present

## 2018-02-05 NOTE — Addendum Note (Signed)
Addended by: Dairl Ponder on: 02/05/2018 03:08 PM   Modules accepted: Orders

## 2018-02-09 ENCOUNTER — Encounter (INDEPENDENT_AMBULATORY_CARE_PROVIDER_SITE_OTHER): Payer: Self-pay

## 2018-02-09 ENCOUNTER — Encounter: Payer: Self-pay | Admitting: Family Medicine

## 2018-02-24 ENCOUNTER — Ambulatory Visit (INDEPENDENT_AMBULATORY_CARE_PROVIDER_SITE_OTHER): Payer: 59 | Admitting: Orthopedic Surgery

## 2018-02-24 ENCOUNTER — Encounter (INDEPENDENT_AMBULATORY_CARE_PROVIDER_SITE_OTHER): Payer: Self-pay | Admitting: Orthopedic Surgery

## 2018-02-24 VITALS — Ht 61.0 in | Wt 178.0 lb

## 2018-02-24 DIAGNOSIS — M1711 Unilateral primary osteoarthritis, right knee: Secondary | ICD-10-CM

## 2018-03-11 ENCOUNTER — Encounter (INDEPENDENT_AMBULATORY_CARE_PROVIDER_SITE_OTHER): Payer: Self-pay | Admitting: Orthopedic Surgery

## 2018-03-11 DIAGNOSIS — M1711 Unilateral primary osteoarthritis, right knee: Secondary | ICD-10-CM | POA: Diagnosis not present

## 2018-03-11 MED ORDER — METHYLPREDNISOLONE ACETATE 40 MG/ML IJ SUSP
40.0000 mg | INTRAMUSCULAR | Status: AC | PRN
  Administered 2018-03-11: 40 mg via INTRA_ARTICULAR

## 2018-03-11 MED ORDER — LIDOCAINE HCL 1 % IJ SOLN
5.0000 mL | INTRAMUSCULAR | Status: AC | PRN
  Administered 2018-03-11: 5 mL

## 2018-03-11 NOTE — Progress Notes (Signed)
Office Visit Note   Patient: Janice Higgins           Date of Birth: 05/10/1956           MRN: [id number] Visit Date: 02/24/2018              Requested by: Mikey Kirschner, [place] [place], [place] [address] PCP: Mikey Kirschner, MD  Chief Complaint  Patient presents with  . Right Knee - Pain      HPI: Patient is a 62 year old woman with osteoarthritis of her right knee.  She has used diclofenac 75 mg a day without relief she is recently been treated with a steroid Dosepak which  did  help.  Patient states that she has had arthritic symptoms for 16 years.  Patient complains of popping and increased pain with increased activities.  Assessment & Plan: Visit Diagnoses:  1. Unilateral primary osteoarthritis, right knee     Plan: The knee was injected from the anterior medial portal reevaluate in 4 weeks.  Follow-Up Instructions: Return in about 4 weeks (around 03/24/2018).   Ortho Exam  Patient is alert, oriented, no adenopathy, well-dressed, normal affect, normal respiratory effort. Examination patient has an antalgic gait.  She is maximally tender to palpation over the medial joint line.  Collaterals and cruciates are stable there is no redness no cellulitis there is minimal effusion.  There is crepitation with range of motion of her knee.  Imaging: No results found. No images are attached to the encounter.  Labs: No results found for: HGBA1C, ESRSEDRATE, CRP, LABURIC, REPTSTATUS, GRAMSTAIN, CULT, LABORGA   Lab Results  Component Value Date   ALBUMIN 4.2 11/26/2017   ALBUMIN 4.0 01/09/2015   ALBUMIN 4.2 12/13/2013    Body mass index is 33.63 kg/m.  Orders:  Orders Placed This Encounter  Procedures  . Large Joint Inj   No orders of the defined types were placed in this encounter.    Procedures: Large Joint Inj: R knee on 03/11/2018 4:35 PM Indications: pain and diagnostic evaluation Details: 22 G 1.5 in needle, anteromedial  approach  Arthrogram: No  Medications: 5 mL lidocaine 1 %; 40 mg methylPREDNISolone acetate 40 MG/ML Outcome: tolerated well, no immediate complications Procedure, treatment alternatives, risks and benefits explained, specific risks discussed. Consent was given by the patient. Immediately prior to procedure a time out was called to verify the correct patient, procedure, equipment, support staff and site/side marked as required. Patient was prepped and draped in the usual sterile fashion.      Clinical Data: No additional findings.  ROS:  All other systems negative, except as noted in the HPI. Review of Systems  Objective: Vital Signs: Ht 5\' 1"  (1.549 m)   Wt 178 lb (80.7 kg)   BMI 33.63 kg/m   Specialty Comments:  No specialty comments available.  PMFS History: Patient Active Problem List   Diagnosis Date Noted  . Unilateral primary osteoarthritis, right knee 03/11/2018  . Morbid obesity due to excess calories ([facility]) 06/16/2015  . PMB (postmenopausal bleeding) 04/25/2015  . History of breast cancer 04/25/2015  . Breast cancer ([facility])   . Family history of breast cancer   . Breast cancer, left breast ([facility]) 08/30/2014  . 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 HNP (herniated nucleus pulposus), cervical 06/03/2011  . OTHER ENTHESOPATHY OF ANKLE AND TARSUS 08/31/2008  . PLANTAR FACIITIS 07/20/2008   Past Medical History:  Diagnosis Date  . 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 Family history of breast cancer   . History of breast cancer 04/25/2015  . History of bronchitis   . History of kidney stones   . Osteoarthritis    hands and feet  . PMB (postmenopausal bleeding) 04/25/2015  . Radiation 10/13/14-11/04/14   Left breast    Family History  Problem Relation Age of Onset  . Hypertension Father   . Hypertension Mother   . Heart disease Mother 37  . Breast cancer Sister 51  . Other Brother         lived 3 months  . Heart attack Maternal Grandfather   . Emphysema Paternal Grandfather   . COPD Paternal Grandfather     Past Surgical History:  Procedure Laterality Date  . BREAST LUMPECTOMY WITH RADIOACTIVE SEED LOCALIZATION Left 08/12/2014   Procedure:  RADIOACTIVE SEED LOCALIZATION LEFT BREAST LUMPECTOMY;  Surgeon: Excell Seltzer, MD;  Location: [facility];  Service: General;  Laterality: Left;  . COLONOSCOPY N/A 05/04/2015   Procedure: COLONOSCOPY;  Surgeon: Rogene Houston, MD;  [facility];  Service: Endoscopy;  Laterality: N/A;  7:30  . FOOT SURGERY Right 1995   exc. bone spur  . LASIK    . RE-EXCISION OF BREAST LUMPECTOMY Left 08/22/2014   Procedure: RE-EXCISION OF LEFT BREAST LUMPECTOMY;  Surgeon: Excell Seltzer, MD;  Location: [facility];  Service: General;  Laterality: Left;  . RE-EXCISION OF BREAST LUMPECTOMY Left 09/05/2014   Procedure: RE-EXCISION OF BREAST LUMPECTOMY;  Surgeon: Excell Seltzer, MD;  Location: [facility];  Service: General;  Laterality: Left;  . TONSILLECTOMY    . TUBAL LIGATION     Social History   Occupational History  . Not on file  Tobacco Use  . Smoking status: Never Smoker  . Smokeless tobacco: Never Used  Substance and Sexual Activity  . Alcohol use: No  . Drug use: No  . Sexual activity: Yes    Birth control/protection: Post-menopausal

## 2018-03-18 MED FILL — DICLOFENAC SODIUM 75 MG TAB: 75 | 30 days supply | Qty: 60 | Fill #1

## 2018-05-05 MED FILL — DICLOFENAC SODIUM 75 MG TAB: 75 | 30 days supply | Qty: 60 | Fill #2

## 2018-06-15 ENCOUNTER — Telehealth: Payer: Self-pay | Admitting: Family Medicine

## 2018-06-15 DIAGNOSIS — C50912 Malignant neoplasm of unspecified site of left female breast: Secondary | ICD-10-CM

## 2018-06-15 NOTE — Telephone Encounter (Signed)
Pt requesting an order placed for her yearly mammogram.  Last mammogram: 06/24/2017.  Advise.

## 2018-06-15 NOTE — Telephone Encounter (Signed)
Please advise. Thank you

## 2018-06-15 NOTE — Telephone Encounter (Signed)
Please set up diagnostic mammogram, history of breast cancer

## 2018-06-16 NOTE — Telephone Encounter (Signed)
I called and left a message for the pt to call back.Patient is scheduled for July 21, 2018 at 8:45 am for 9:00 am.

## 2018-06-16 NOTE — Addendum Note (Signed)
Addended by: Karle Barr on: 06/16/2018 10:31 AM   Modules accepted: Orders

## 2018-06-17 NOTE — Telephone Encounter (Signed)
Pt is aware of appt

## 2018-06-23 ENCOUNTER — Encounter: Payer: Self-pay | Admitting: Family Medicine

## 2018-06-23 ENCOUNTER — Ambulatory Visit: Payer: 59 | Admitting: Family Medicine

## 2018-06-23 VITALS — BP 122/82 | Temp 98.3°F | Ht 61.0 in | Wt 167.2 lb

## 2018-06-23 DIAGNOSIS — J329 Chronic sinusitis, unspecified: Secondary | ICD-10-CM

## 2018-06-23 DIAGNOSIS — I889 Nonspecific lymphadenitis, unspecified: Secondary | ICD-10-CM

## 2018-06-23 DIAGNOSIS — J310 Chronic rhinitis: Secondary | ICD-10-CM

## 2018-06-23 MED ORDER — CEFDINIR 300 MG PO CAPS: 300.0000 mg | ORAL_CAPSULE | Freq: Two times a day (BID) | ORAL | 0 refills | Status: DC

## 2018-06-23 MED ORDER — ALBUTEROL SULFATE HFA 108 (90 BASE) MCG/ACT IN AERS
2.0000 | INHALATION_SPRAY | Freq: Four times a day (QID) | RESPIRATORY_TRACT | 2 refills | Status: DC | PRN
Start: 2018-06-23 — End: 2019-08-24

## 2018-06-23 MED FILL — CEFDINIR 300 MG CAPSULE: 300 | 10 days supply | Qty: 20 | Fill #0

## 2018-06-23 MED FILL — VENTOLIN HFA 90 MCG INHALER: 108 (90 BAS | 25 days supply | Qty: 18 | Fill #0

## 2018-06-23 NOTE — Progress Notes (Signed)
   Subjective:    Patient ID: Cherylyn Sundby, female    DOB: 1955-10-24, 62 y.o.   MRN: [id number]  Cough  This is a new problem. The current episode started in the past 7 days. Associated symptoms include ear pain, headaches, nasal congestion, a sore throat and wheezing. She has tried OTC cough suppressant (claritin and dayquil) for the symptoms.   Hit hard firve days, ago,  talong dayquil and yquil   trhaot quite sore   Gland swollen and tender   Otalgia right side    inhaer ha expired    Review of Systems  HENT: Positive for ear pain and sore throat.   Respiratory: Positive for cough and wheezing.   Neurological: Positive for headaches.       Objective:   Physical Exam   Alert, mild malaise. Hydration good Vitals stable.  Positive tender right anterior cervical node frontal/ maxillary tenderness evident positive nasal congestion. pharynx normal neck supple  lungs clear/no crackles or wheezes. heart regular in rhythm     Assessment & Plan:  Impression rhinosinusitis/cervical lymphadenitis likely post viral, discussed with patient. plan antibiotics prescribed. Questions answered. Symptomatic care discussed. warning signs discussed. WSL

## 2018-07-02 MED FILL — DICLOFENAC SODIUM 75 MG TAB: 75 | 30 days supply | Qty: 60 | Fill #3

## 2018-07-20 ENCOUNTER — Telehealth: Payer: No Typology Code available for payment source | Admitting: Family

## 2018-07-20 DIAGNOSIS — R6889 Other general symptoms and signs: Secondary | ICD-10-CM | POA: Diagnosis not present

## 2018-07-20 MED ORDER — OSELTAMIVIR PHOSPHATE 75 MG PO CAPS: 75.0000 mg | ORAL_CAPSULE | Freq: Two times a day (BID) | ORAL | 0 refills | Status: DC

## 2018-07-20 NOTE — Progress Notes (Signed)

## 2018-07-21 ENCOUNTER — Ambulatory Visit (HOSPITAL_COMMUNITY)
Admission: RE | Admit: 2018-07-21 | Discharge: 2018-07-21 | Disposition: A | Discharge status: Home / Self Care | Payer: No Typology Code available for payment source | Source: Ambulatory Visit | Attending: Family Medicine | Admitting: Family Medicine

## 2018-07-21 ENCOUNTER — Ambulatory Visit (HOSPITAL_COMMUNITY): Admission: RE | Admit: 2018-07-21 | Payer: No Typology Code available for payment source | Source: Ambulatory Visit

## 2018-07-21 ENCOUNTER — Ambulatory Visit (HOSPITAL_COMMUNITY): Payer: No Typology Code available for payment source

## 2018-07-21 DIAGNOSIS — C50912 Malignant neoplasm of unspecified site of left female breast: Secondary | ICD-10-CM | POA: Diagnosis present

## 2018-09-02 MED FILL — DICLOFENAC SODIUM 75 MG TAB: 75 | 30 days supply | Qty: 60 | Fill #4 | Status: TO

## 2018-11-03 MED FILL — DICLOFENAC SODIUM 75 MG TAB: 75 | 30 days supply | Qty: 60 | Fill #0

## 2018-12-09 ENCOUNTER — Telehealth: Payer: Self-pay | Admitting: Family Medicine

## 2018-12-09 DIAGNOSIS — R5383 Other fatigue: Secondary | ICD-10-CM

## 2018-12-09 DIAGNOSIS — Z13220 Encounter for screening for lipoid disorders: Secondary | ICD-10-CM

## 2018-12-09 DIAGNOSIS — Z79899 Other long term (current) drug therapy: Secondary | ICD-10-CM

## 2018-12-09 NOTE — Telephone Encounter (Signed)
Please advise patient last had lab drawn 11/26/2017 Lipid tsh cbc bmet hepatic lipid.

## 2018-12-09 NOTE — Telephone Encounter (Signed)
Pt has CPE scheduled for 6/8 and would like to have lab work done before appt.

## 2018-12-09 NOTE — Telephone Encounter (Signed)
Labs ordered . I called left a detailed message about labs having been ordered. I asked that she please call us back to let us know she is aware of all.

## 2018-12-09 NOTE — Telephone Encounter (Signed)
Rep same 

## 2018-12-10 NOTE — Telephone Encounter (Signed)
Pt.notified

## 2018-12-14 ENCOUNTER — Other Ambulatory Visit (HOSPITAL_COMMUNITY)
Admission: RE | Admit: 2018-12-14 | Discharge: 2018-12-14 | Disposition: A | Discharge status: Home / Self Care | Payer: No Typology Code available for payment source | Source: Ambulatory Visit | Attending: Family Medicine | Admitting: Family Medicine

## 2018-12-14 ENCOUNTER — Other Ambulatory Visit: Payer: Self-pay

## 2018-12-14 DIAGNOSIS — Z79899 Other long term (current) drug therapy: Secondary | ICD-10-CM | POA: Diagnosis present

## 2018-12-14 DIAGNOSIS — Z13220 Encounter for screening for lipoid disorders: Secondary | ICD-10-CM | POA: Insufficient documentation

## 2018-12-14 LAB — CBC WITH DIFFERENTIAL/PLATELET
Abs Immature Granulocytes: 0.03 10*3/uL (ref 0.00–0.07)
Basophils Absolute: 0.1 10*3/uL (ref 0.0–0.1)
Basophils Relative: 1 %
Eosinophils Absolute: 0.2 10*3/uL (ref 0.0–0.5)
Eosinophils Relative: 2 %
HCT: 43.7 % (ref 36.0–46.0)
Hemoglobin: 14.1 g/dL (ref 12.0–15.0)
Immature Granulocytes: 0 %
Lymphocytes Relative: 26 %
Lymphs Abs: 2.2 10*3/uL (ref 0.7–4.0)
MCH: 30.3 pg (ref 26.0–34.0)
MCHC: 32.3 g/dL (ref 30.0–36.0)
MCV: 93.8 fL (ref 80.0–100.0)
Monocytes Absolute: 0.6 10*3/uL (ref 0.1–1.0)
Monocytes Relative: 8 %
Neutro Abs: 5.2 10*3/uL (ref 1.7–7.7)
Neutrophils Relative %: 63 %
Platelets: 319 10*3/uL (ref 150–400)
RBC: 4.66 MIL/uL (ref 3.87–5.11)
RDW: 13.2 % (ref 11.5–15.5)
WBC: 8.2 10*3/uL (ref 4.0–10.5)
nRBC: 0 % (ref 0.0–0.2)

## 2018-12-14 LAB — HEPATIC FUNCTION PANEL
ALT: 33 U/L (ref 0–44)
AST: 20 U/L (ref 15–41)
Albumin: 4.2 g/dL (ref 3.5–5.0)
Alkaline Phosphatase: 57 U/L (ref 38–126)
Bilirubin, Direct: 0.1 mg/dL (ref 0.0–0.2)
Indirect Bilirubin: 0.3 mg/dL (ref 0.3–0.9)
Total Bilirubin: 0.4 mg/dL (ref 0.3–1.2)
Total Protein: 7.4 g/dL (ref 6.5–8.1)

## 2018-12-14 LAB — BASIC METABOLIC PANEL
Anion gap: 13 (ref 5–15)
BUN: 20 mg/dL (ref 8–23)
CO2: 25 mmol/L (ref 22–32)
Calcium: 9.6 mg/dL (ref 8.9–10.3)
Chloride: 106 mmol/L (ref 98–111)
Creatinine, Ser: 0.61 mg/dL (ref 0.44–1.00)
GFR calc Af Amer: >60 mL/min (ref >60)
GFR calc non Af Amer: >60 mL/min (ref >60)
Glucose, Bld: 106 mg/dL — ABNORMAL HIGH (ref 70–99)
Potassium: 4.4 mmol/L (ref 3.5–5.1)
Sodium: 144 mmol/L (ref 135–145)

## 2018-12-14 LAB — LIPID PANEL
Cholesterol: 159 mg/dL (ref 0–200)
HDL: 64 mg/dL (ref >40)
LDL Cholesterol: 81 mg/dL (ref 0–99)
Total CHOL/HDL Ratio: 2.5 RATIO
Triglycerides: 70 mg/dL (ref <150)
VLDL: 14 mg/dL (ref 0–40)

## 2018-12-14 LAB — TSH: TSH: 3.714 u[IU]/mL (ref 0.350–4.500)

## 2018-12-21 ENCOUNTER — Other Ambulatory Visit: Payer: Self-pay

## 2018-12-21 ENCOUNTER — Ambulatory Visit (INDEPENDENT_AMBULATORY_CARE_PROVIDER_SITE_OTHER): Payer: No Typology Code available for payment source | Admitting: Nurse Practitioner

## 2018-12-21 ENCOUNTER — Encounter: Payer: 59 | Admitting: Nurse Practitioner

## 2018-12-21 ENCOUNTER — Other Ambulatory Visit: Payer: Self-pay | Admitting: Nurse Practitioner

## 2018-12-21 ENCOUNTER — Encounter: Payer: Self-pay | Admitting: Nurse Practitioner

## 2018-12-21 VITALS — BP 132/84 | Temp 97.3°F | Wt 179.0 lb

## 2018-12-21 DIAGNOSIS — Z01419 Encounter for gynecological examination (general) (routine) without abnormal findings: Secondary | ICD-10-CM

## 2018-12-21 DIAGNOSIS — Z0000 Encounter for general adult medical examination without abnormal findings: Secondary | ICD-10-CM

## 2018-12-21 DIAGNOSIS — L989 Disorder of the skin and subcutaneous tissue, unspecified: Secondary | ICD-10-CM

## 2018-12-21 DIAGNOSIS — R7301 Impaired fasting glucose: Secondary | ICD-10-CM

## 2018-12-21 LAB — POCT GLYCOSYLATED HEMOGLOBIN (HGB A1C): Hemoglobin A1C: 5.3 % (ref 4.0–5.6)

## 2018-12-21 MED ORDER — FAMOTIDINE 20 MG PO TABS: 20.0000 mg | ORAL_TABLET | Freq: Two times a day (BID) | ORAL | 1 refills | Status: DC

## 2018-12-21 MED ORDER — PHENTERMINE HCL 37.5 MG PO TABS: 37.5000 mg | ORAL_TABLET | Freq: Every day | ORAL | 2 refills | Status: DC

## 2018-12-21 NOTE — Progress Notes (Signed)
   Subjective:    Patient ID: Janice Higgins, female    DOB: Jan 18, 1956, 63 y.o.   MRN: [id number]  HPI The patient comes in today for a wellness visit.    A review of their health history was completed.  A review of medications was also completed.  Any needed refills; Diclofenac  Eating habits: eats well   Falls/  MVA accidents in past few months: none  Regular exercise: not at this time; arthritis will not let pt   Specialist pt sees on regular basis: none  Preventative health issues were discussed.   Additional concerns: none   Review of Systems     Objective:   Physical Exam        Assessment & Plan:

## 2018-12-21 NOTE — Progress Notes (Signed)
Subjective:    Patient ID: Janice Higgins, female    DOB: [birth year], 63 y.o.   MRN: [id number]  HPI Presents for her preventive health physical. Had her yearly labs done recently. Regular vision and dental exams. Bone density exam up to date.     Review of Systems  Constitutional: Negative for activity change, appetite change and fatigue.  HENT: Negative for dental problem, ear pain, rhinorrhea, sinus pressure and sore throat.   Respiratory: Positive for shortness of breath. Negative for cough, chest tightness and wheezing.        Has had some mild SOB since she has gained weight. No change. Limited activity due to arthritis.   Cardiovascular: Negative for chest pain.  Gastrointestinal: Negative for abdominal distention, abdominal pain, constipation, diarrhea, nausea and vomiting.       Diclofenac has helped arthritis but causes mild acid reflux at times.   Genitourinary: Positive for enuresis, frequency and urgency. Negative for difficulty urinating, dysuria, genital sores, pelvic pain, vaginal bleeding and vaginal discharge.       Mainly urge incontinence. Has noticed more with weight gain.   Musculoskeletal: Positive for arthralgias and joint swelling.  Skin:       Small lesion left anterior lower leg that keeps coming back after several months.   Neurological: Negative for headaches.       Objective:   Physical Exam Constitutional:      General: She is not in acute distress.    Appearance: She is well-developed.  Neck:     Musculoskeletal: Normal range of motion and neck supple.     Thyroid: No thyromegaly.     Trachea: No tracheal deviation.     Comments: Thyroid smooth, no masses, nontender.  Cardiovascular:     Rate and Rhythm: Normal rate and regular rhythm.     Heart sounds: Normal heart sounds. No murmur. No gallop.   Pulmonary:     Effort: Pulmonary effort is normal.     Breath sounds: Normal breath sounds.  Chest:     Breasts: Breasts are symmetrical.   Right: Normal. No inverted nipple, mass, nipple discharge or skin change.        Left: Normal. No inverted nipple, mass, nipple discharge or skin change.  Abdominal:     General: There is no distension.     Palpations: Abdomen is soft.     Tenderness: There is no abdominal tenderness.  Genitourinary:    Comments: Recommend GU exam but patient defers.  Lymphadenopathy:     Cervical: No cervical adenopathy.  Skin:    General: Skin is warm and dry.     Comments: Small mildly erythematous superficial lesion on the upper left pretibial area with small amount of dry skin. Sun damage noted.   Neurological:     Mental Status: She is alert and oriented to person, place, and time.  Psychiatric:        Mood and Affect: Mood normal.        Behavior: Behavior normal.        Thought Content: Thought content normal.        Judgment: Judgment normal.    Axillae: no lymphadenopathy.  Recent Results (from the past 2160 hour(s))  TSH     Status: None   Collection Time: 12/14/18  8:24 AM  Result Value Ref Range   TSH 3.714 0.350 - 4.500 uIU/mL    Comment: Performed by a 3rd Generation assay with a functional sensitivity of <=0.01 uIU/mL. Performed at  [facility], [address] [place], [place] [address]   Lipid panel     Status: None   Collection Time: 12/14/18  8:24 AM  Result Value Ref Range   Cholesterol 159 0 - 200 mg/dL   Triglycerides 70 <150 mg/dL   HDL 64 >40 mg/dL   Total CHOL/HDL Ratio 2.5 RATIO   VLDL 14 0 - 40 mg/dL   LDL Cholesterol 81 0 - 99 mg/dL    Comment:        Total Cholesterol/HDL:CHD Risk Coronary Heart Disease Risk Table                     Men   Women  1/2 Average Risk   3.4   3.3  Average Risk       5.0   4.4  2 X Average Risk   9.6   7.1  3 X Average Risk  23.4   11.0        Use the calculated Patient Ratio above and the CHD Risk Table to determine the patient's CHD Risk.        ATP III CLASSIFICATION (LDL):  <100     mg/dL   Optimal  100-129  mg/dL    Near or Above                    Optimal  130-159  mg/dL   Borderline  160-189  mg/dL   High  >190     mg/dL   Very High Performed at [facility], [address] [place], [place] [address]   CBC with Differential/Platelet     Status: None   Collection Time: 12/14/18  8:24 AM  Result Value Ref Range   WBC 8.2 4.0 - 10.5 K/uL   RBC 4.66 3.87 - 5.11 MIL/uL   Hemoglobin 14.1 12.0 - 15.0 g/dL   HCT 43.7 36.0 - 46.0 %   MCV 93.8 80.0 - 100.0 fL   MCH 30.3 26.0 - 34.0 pg   MCHC 32.3 30.0 - 36.0 g/dL   RDW 13.2 11.5 - 15.5 %   Platelets 319 150 - 400 K/uL   nRBC 0.0 0.0 - 0.2 %   Neutrophils Relative % 63 %   Neutro Abs 5.2 1.7 - 7.7 K/uL   Lymphocytes Relative 26 %   Lymphs Abs 2.2 0.7 - 4.0 K/uL   Monocytes Relative 8 %   Monocytes Absolute 0.6 0.1 - 1.0 K/uL   Eosinophils Relative 2 %   Eosinophils Absolute 0.2 0.0 - 0.5 K/uL   Basophils Relative 1 %   Basophils Absolute 0.1 0.0 - 0.1 K/uL   Immature Granulocytes 0 %   Abs Immature Granulocytes 0.03 0.00 - 0.07 K/uL    Comment: Performed at [facility], [address], [place], [place] [address]  Basic metabolic panel     Status: Abnormal   Collection Time: 12/14/18  8:24 AM  Result Value Ref Range   Sodium 144 135 - 145 mmol/L   Potassium 4.4 3.5 - 5.1 mmol/L   Chloride 106 98 - 111 mmol/L   CO2 25 22 - 32 mmol/L   Glucose, Bld 106 (H) 70 - 99 mg/dL   BUN 20 8 - 23 mg/dL   Creatinine, Ser 0.61 0.44 - 1.00 mg/dL   Calcium 9.6 8.9 - 10.3 mg/dL   GFR calc non Af Amer >60 >60 mL/min   GFR calc Af Amer >60 >60 mL/min   Anion gap 13 5 -  15    Comment: Performed at [facility], [address], [place], [place] [address]  Hepatic function panel     Status: None   Collection Time: 12/14/18  8:24 AM  Result Value Ref Range   Total Protein 7.4 6.5 - 8.1 g/dL   Albumin 4.2 3.5 - 5.0 g/dL   AST 20 15 - 41 U/L   ALT 33 0 - 44 U/L   Alkaline Phosphatase 57 38 - 126 U/L   Total Bilirubin 0.4 0.3 - 1.2 mg/dL   Bilirubin, Direct  0.1 0.0 - 0.2 mg/dL   Indirect Bilirubin 0.3 0.3 - 0.9 mg/dL    Comment: Performed at [facility], [address], [place], [place] [address]  POCT HgB A1C     Status: None   Collection Time: 12/21/18  2:02 PM  Result Value Ref Range   Hemoglobin A1C 5.3 4.0 - 5.6 %   HbA1c POC (<> result, manual entry)     HbA1c, POC (prediabetic range)     HbA1c, POC (controlled diabetic range)     Reviewed labs with patient.     Assessment & Plan:   Problem List Items Addressed This Visit      Endocrine   Impaired fasting glucose   Relevant Orders   POCT HgB A1C (Completed)     Other   Morbid obesity due to excess calories ([facility])   Relevant Medications   phentermine (ADIPEX-P) 37.5 MG tablet    Other Visit Diagnoses    Well woman exam    -  Primary   Skin lesion of left lower extremity         1. Increase activity that is easier on the joints such as water aerobics or recumbent bike.  2. Consider rejoining [employer]. Can restart Phentermine but DC if no significant weight loss. Has taken this without difficulty in the past. Wants to hold off on further intervention for urinary symptoms at this time. Will focus on weight loss first. If worsens or new symptoms, call office. 3. Refer to [employer], [place] for skin cancer screening and evaluation of lesion.  4. Return in about 1 year (around 12/21/2019) for physical. Recheck sooner if needed.

## 2018-12-22 ENCOUNTER — Other Ambulatory Visit: Payer: Self-pay | Admitting: Nurse Practitioner

## 2018-12-22 MED ORDER — PHENTERMINE HCL 37.5 MG PO TABS: 37.5000 mg | ORAL_TABLET | Freq: Every day | ORAL | 2 refills | Status: DC

## 2018-12-22 MED FILL — PHENTERMINE 37.5 MG TABLET: 37.5 | 30 days supply | Qty: 30 | Fill #0

## 2018-12-23 MED FILL — DICLOFENAC SODIUM 75 MG TAB: 75 | 30 days supply | Qty: 60 | Fill #0

## 2019-01-05 ENCOUNTER — Encounter: Payer: Self-pay | Admitting: Family Medicine

## 2019-02-01 ENCOUNTER — Encounter: Payer: Self-pay | Admitting: Physician Assistant

## 2019-02-01 ENCOUNTER — Telehealth: Payer: No Typology Code available for payment source | Admitting: Physician Assistant

## 2019-02-01 DIAGNOSIS — M549 Dorsalgia, unspecified: Secondary | ICD-10-CM | POA: Diagnosis not present

## 2019-02-01 MED ORDER — CYCLOBENZAPRINE HCL 10 MG PO TABS: 10.0000 mg | ORAL_TABLET | Freq: Three times a day (TID) | ORAL | 0 refills | Status: DC | PRN

## 2019-02-01 MED ORDER — NAPROXEN 500 MG PO TABS: 500.0000 mg | ORAL_TABLET | Freq: Two times a day (BID) | ORAL | 0 refills | Status: DC

## 2019-02-01 NOTE — Progress Notes (Signed)
We are sorry that you are not feeling well.  Here is how we plan to help!  Based on what you have shared with me it looks like you mostly have acute back pain.  Acute back pain is defined as musculoskeletal pain that can resolve in 1-3 weeks with conservative treatment.  I have prescribed Naprosyn 500 mg twice a day non-steroid anti-inflammatory (NSAID) as well as Flexeril 10 mg every eight hours as needed which is a muscle relaxer  Some patients experience stomach irritation or in increased heartburn with anti-inflammatory drugs.  Please keep in mind that muscle relaxer's can cause fatigue and should not be taken while at work or driving.  Back pain is very common.  The pain often gets better over time.  The cause of back pain is usually not dangerous.  Most people can learn to manage their back pain on their own.  Avoid taking any other NSAIDS such as Diclfenac, Ibuprofen, etc with the prescribed Naprosyn  Home Care  Stay active.  Start with short walks on flat ground if you can.  Try to walk farther each day.  Do not sit, drive or stand in one place for more than 30 minutes.  Do not stay in bed.  Do not avoid exercise or work.  Activity can help your back heal faster.  Be careful when you bend or lift an object.  Bend at your knees, keep the object close to you, and do not twist.  Sleep on a firm mattress.  Lie on your side, and bend your knees.  If you lie on your back, put a pillow under your knees.  Only take medicines as told by your doctor.  Put ice on the injured area.  Put ice in a plastic bag  Place a towel between your skin and the bag  Leave the ice on for 15-20 minutes, 3-4 times a day for the first 2-3 days. 210 After that, you can switch between ice and heat packs.  Ask your doctor about back exercises or massage.  Avoid feeling anxious or stressed.  Find good ways to deal with stress, such as exercise.  Get Help Right Way If:  Your pain does not go away with  rest or medicine.  Your pain does not go away in 1 week.  You have new problems.  You do not feel well.  The pain spreads into your legs.  You cannot control when you poop (bowel movement) or pee (urinate)  You feel sick to your stomach (nauseous) or throw up (vomit)  You have belly (abdominal) pain.  You feel like you may pass out (faint).  If you develop a fever.  Make Sure you:  Understand these instructions.  Will watch your condition  Will get help right away if you are not doing well or get worse.  Your e-visit answers were reviewed by a board certified advanced clinical practitioner to complete your personal care plan.  Depending on the condition, your plan could have included both over the counter or prescription medications.  If there is a problem please reply  once you have received a response from your provider.  Your safety is important to [place].  If you have drug allergies check your prescription carefully.    You can use MyChart to ask questions about today's visit, request a non-urgent call back, or ask for a work or school excuse for 24 hours related to this e-Visit. If it has been greater than 24 hours you  will need to follow up with your provider, or enter a new e-Visit to address those concerns.  You will get an e-mail in the next two days asking about your experience.  I hope that your e-visit has been valuable and will speed your recovery. Thank you for using e-visits.  I spent 5-10 minutes on review and completion of this note- Lacy Duverney [facility]

## 2019-02-08 ENCOUNTER — Ambulatory Visit (INDEPENDENT_AMBULATORY_CARE_PROVIDER_SITE_OTHER): Payer: No Typology Code available for payment source | Admitting: Nurse Practitioner

## 2019-02-08 ENCOUNTER — Other Ambulatory Visit: Payer: Self-pay

## 2019-02-08 ENCOUNTER — Encounter: Payer: Self-pay | Admitting: Nurse Practitioner

## 2019-02-08 DIAGNOSIS — M549 Dorsalgia, unspecified: Secondary | ICD-10-CM

## 2019-02-08 MED ORDER — LIDOCAINE 5 % EX PTCH: MEDICATED_PATCH | CUTANEOUS | 0 refills | Status: DC

## 2019-02-08 MED ORDER — HYDROCODONE-ACETAMINOPHEN 5-325 MG PO TABS: 1.0000 | ORAL_TABLET | ORAL | 0 refills | Status: DC | PRN

## 2019-02-08 NOTE — Progress Notes (Signed)
   Subjective:    Patient ID: Janice Higgins, female    DOB: July 31, 1955, 63 y.o.   MRN: [id number] PHONE VISIT HPI Patient calls with back pain for one week. Patient states she feels better up than when she lays down. Patient states it also hurts to pick up stuff. Patient states she had been doing a job with heavy lifting she was not used to before it happened.  Virtual Visit via Video Note  I connected with Janice Higgins on 02/08/19 at 11:20 AM EDT by a video enabled telemedicine application and verified that I am speaking with the correct person using two identifiers.  Location: Patient: home Provider: office   I discussed the limitations of evaluation and management by telemedicine and the availability of in person appointments. The patient expressed understanding and agreed to proceed.  History of Present Illness: Presents for c/o back pain for over a week. Did some heavy lifting a week ago Friday. Was fine that day but had pain and stiffness the next morning. No other history of injury. Did an e-visit. Was given Naproxen and Flexeril. Naproxen does not work as well as her Diclofenac which she takes for arthritis pain. Pain has improved. Better during the day but worse at night. Having trouble sleeping. No radiation of the pain. Localized to her mid back area at the spine. No numbness. Minimal relief with Flexeril which she cannot take during the day due to drowsiness. Has lost 10 lbs on Phentermine. ROS: herniated cervical disk; OA   Observations/Objective: Today's visit was via telephone Physical exam was not possible for this visit   Assessment and Plan: Problem List Items Addressed This Visit    None    Visit Diagnoses    Mid-back pain, acute    -  Primary   Relevant Medications   HYDROcodone-acetaminophen (NORCO/VICODIN) 5-325 MG tablet     Meds ordered this encounter  Medications  . HYDROcodone-acetaminophen (NORCO/VICODIN) 5-325 MG tablet    Sig: Take 1 tablet by mouth every 4  (four) hours as needed.    Dispense:  20 tablet    Refill:  0    Order Specific Question:   Supervising Provider    Answer:   Sallee Lange A [[id number]]  . lidocaine (LIDODERM) 5 %    Sig: Apply to affected area for up to 12 hours each day prn pain; then remove and discard    Dispense:  15 patch    Refill:  0    Order Specific Question:   Supervising Provider    Answer:   Sallee Lange A [[id number]]     Follow Up Instructions: Discussed options. Trial of Lidoderm patch as directed. DC Naproxen and restart Diclofenac as directed. Short term Rx for pain med. Drowsiness precautions.  Ice/heat applications.  Call back by the end of the week of no improvement.    I discussed the assessment and treatment plan with the patient. The patient was provided an opportunity to ask questions and all were answered. The patient agreed with the plan and demonstrated an understanding of the instructions.   The patient was advised to call back or seek an in-person evaluation if the symptoms worsen or if the condition fails to improve as anticipated.  I provided 15 minutes of non-face-to-face time during this encounter.        Review of Systems     Objective:   Physical Exam        Assessment & Plan:

## 2019-02-10 MED FILL — PHENTERMINE 37.5 MG TABLET: 37.5 | 30 days supply | Qty: 30 | Fill #1

## 2019-02-16 ENCOUNTER — Ambulatory Visit (HOSPITAL_COMMUNITY)
Admission: RE | Admit: 2019-02-16 | Discharge: 2019-02-16 | Disposition: A | Discharge status: Home / Self Care | Payer: No Typology Code available for payment source | Source: Ambulatory Visit | Attending: Family Medicine | Admitting: Family Medicine

## 2019-02-16 ENCOUNTER — Other Ambulatory Visit: Payer: Self-pay

## 2019-02-16 ENCOUNTER — Ambulatory Visit (INDEPENDENT_AMBULATORY_CARE_PROVIDER_SITE_OTHER): Payer: No Typology Code available for payment source | Admitting: Family Medicine

## 2019-02-16 DIAGNOSIS — M549 Dorsalgia, unspecified: Secondary | ICD-10-CM | POA: Insufficient documentation

## 2019-02-16 NOTE — Progress Notes (Signed)
   Subjective:  Audio only  Patient ID: Janice Higgins, female    DOB: 08/06/1955, 63 y.o.   MRN: [id number]  Back Pain This is a recurrent problem. Episode onset: 3 weeks ago  The problem occurs constantly. Pain location: middle part of back  Radiates to: radiates to side and sometimes under breast bone. Stiffness is present at night. Treatments tried: Advil  The treatment provided mild relief.   Pt had virtual visit with Hoyle Sauer on 02/08/2019 and was prescribed pain patches and pain pills. Pt states she can only function is she takes pain pill at night. Pt states she was wearing the patches at work and taking the pain pill. Pt states she is now taking the pills at night and wearing patches at night.   Virtual Visit via Video Note  I connected with Ahna Konkle on 02/16/19 at 10:00 AM EDT by a video enabled telemedicine application and verified that I am speaking with the correct person using two identifiers.  Location: Patient: home Provider: office   I discussed the limitations of evaluation and management by telemedicine and the availability of in person appointments. The patient expressed understanding and agreed to proceed.  History of Present Illness:    Observations/Objective:   Assessment and Plan:   Follow Up Instructions:    I discussed the assessment and treatment plan with the patient. The patient was provided an opportunity to ask questions and all were answered. The patient agreed with the plan and demonstrated an understanding of the instructions.   The patient was advised to call back or seek an in-person evaluation if the symptoms worsen or if the condition fails to improve as anticipated.  I provided 20 minutes of non-face-to-face time during this encounter.   Vicente Males, LPN  Review of Systems  Musculoskeletal: Positive for back pain.       Objective:   Physical Exam  Virtual      Assessment & Plan:  Impression persistent fairly severe mid back pain  with some radiation.  Patient at age range for compression fracture.  We will proceed with x-rays further recommendations based on results

## 2019-02-17 ENCOUNTER — Ambulatory Visit (INDEPENDENT_AMBULATORY_CARE_PROVIDER_SITE_OTHER): Payer: No Typology Code available for payment source | Admitting: Family Medicine

## 2019-02-17 DIAGNOSIS — M8589 Other specified disorders of bone density and structure, multiple sites: Secondary | ICD-10-CM | POA: Diagnosis not present

## 2019-02-17 DIAGNOSIS — S22000A Wedge compression fracture of unspecified thoracic vertebra, initial encounter for closed fracture: Secondary | ICD-10-CM

## 2019-02-17 MED ORDER — ALENDRONATE SODIUM 70 MG PO TABS: ORAL_TABLET | ORAL | 11 refills | Status: DC

## 2019-02-17 MED ORDER — HYDROCODONE-ACETAMINOPHEN 5-325 MG PO TABS: ORAL_TABLET | ORAL | 0 refills | Status: DC

## 2019-02-17 MED FILL — ALENDRONATE NA 70 MG TAB: 70 | 28 days supply | Qty: 4 | Fill #0

## 2019-02-17 NOTE — Progress Notes (Signed)
   Subjective:    Patient ID: Janice Higgins, female    DOB: 1956/04/17, 63 y.o.   MRN: [id number] Cardio audio HPI Pt had xrays done on spine yesterday. Provider would like to speak with patient to discuss ongoing treatment.   Virtual Visit via Video Note  I connected with Janice Higgins on 02/17/19 at  4:10 PM EDT by a video enabled telemedicine application and verified that I am speaking with the correct person using two identifiers.  Location: Patient:home Provider: office   I discussed the limitations of evaluation and management by telemedicine and the availability of in person appointments. The patient expressed understanding and agreed to proceed.  History of Present Illness:    Observations/Objective:   Assessment and Plan:   Follow Up Instructions:    I discussed the assessment and treatment plan with the patient. The patient was provided an opportunity to ask questions and all were answered. The patient agreed with the plan and demonstrated an understanding of the instructions.   The patient was advised to call back or seek an in-person evaluation if the symptoms worsen or if the condition fails to improve as anticipated.  I provided 25 minutes of non-face-to-face time during this encounter.   Vicente Males, LPN  Please see prior note.  Ongoing back pain.  Perhaps slightly better.  X-ray revealed multiple compression fractures of thoracic spine.  Discussed with the patient the nature of this.  Patient had bone density test last year that revealed osteopenia.  Patient admits to not doing regular exercise  Pain medicine is started causing some constipation  Primarily in the mid back with some radiation towards the front.  Worse with movement.  Review of Systems No headache no chest pain no abdominal pain    Objective:   Physical Exam   Virtual     Assessment & Plan:  Impression multiple thoracic compression fracture.  Osteopenia via bone density.  Long  discussion held.  Pain control at this time.  Add MiraLAX.  Hydrocodone as needed.  Work excuse given.  Off work next week.  Recommend follow-up in a week.  Initiate Fosamax very 70 mg weekly.  Add vitamin D3 2000 milliunits daily.  Questions answered  Greater than 50% of this 25 minutenon face to face visit was spent in counseling and discussion and coordination of care regarding the above diagnosis/diagnosies

## 2019-02-20 ENCOUNTER — Encounter: Payer: Self-pay | Admitting: Family Medicine

## 2019-02-26 ENCOUNTER — Telehealth: Payer: Self-pay | Admitting: Family Medicine

## 2019-02-26 NOTE — Telephone Encounter (Signed)
FYI

## 2019-02-26 NOTE — Telephone Encounter (Signed)
Pt wanted to let Dr. Richardson Landry know she is feeling better and should be able to return to work on Monday.

## 2019-02-26 NOTE — Telephone Encounter (Signed)
ok 

## 2019-03-03 ENCOUNTER — Ambulatory Visit (INDEPENDENT_AMBULATORY_CARE_PROVIDER_SITE_OTHER): Payer: No Typology Code available for payment source | Admitting: Family Medicine

## 2019-03-03 ENCOUNTER — Other Ambulatory Visit: Payer: Self-pay | Admitting: Family Medicine

## 2019-03-03 ENCOUNTER — Other Ambulatory Visit: Payer: Self-pay

## 2019-03-03 DIAGNOSIS — M8589 Other specified disorders of bone density and structure, multiple sites: Secondary | ICD-10-CM | POA: Diagnosis not present

## 2019-03-03 DIAGNOSIS — S22000D Wedge compression fracture of unspecified thoracic vertebra, subsequent encounter for fracture with routine healing: Secondary | ICD-10-CM

## 2019-03-03 DIAGNOSIS — S22000A Wedge compression fracture of unspecified thoracic vertebra, initial encounter for closed fracture: Secondary | ICD-10-CM

## 2019-03-03 MED FILL — DICLOFENAC SODIUM 75 MG TAB: 75 | 30 days supply | Qty: 60 | Fill #0

## 2019-03-03 NOTE — Progress Notes (Signed)
   Subjective:  Audio only  Patient ID: Janice Higgins, female    DOB: 1955/08/11, 63 y.o.   MRN: [id number]  HPI  Patient calls to discuss ongoing back pain. Patient states the pain is better than it was but is not completely better.  Patient states she took 2 doses of Fosamax and can not take it. Patient states it made her feel like she was burning and threw up.   Virtual Visit via Video Note  I connected with Janice Higgins on 03/03/19 at  3:50 PM EDT by a video enabled telemedicine application and verified that I am speaking with the correct person using two identifiers.  Location: Patient: home Provider: office   I discussed the limitations of evaluation and management by telemedicine and the availability of in person appointments. The patient expressed understanding and agreed to proceed.  History of Present Illness:    Observations/Objective:   Assessment and Plan:   Follow Up Instructions:    I discussed the assessment and treatment plan with the patient. The patient was provided an opportunity to ask questions and all were answered. The patient agreed with the plan and demonstrated an understanding of the instructions.   The patient was advised to call back or seek an in-person evaluation if the symptoms worsen or if the condition fails to improve as anticipated.  I provided 33minutes of non-face-to-face time during this encounter.   Patient simply cannot tolerate Fosamax.  Slowly her back pain is improving.  Still needs pain medication.  Wonders if she can use something and replacement on Fosamax.  Review of Systems No headache, no major weight loss or weight gain, no chest pain no back pain abdominal pain no change in bowel habits complete ROS otherwise negative     Objective:   Physical Exam  Virtual      Assessment & Plan:  Impression osteopenia with multiple thoracic compression fractures.  Will try brace for her back.  Will add rather maintain PRN pain  medication.  Will explore potential use of Boniva in this patient will research setting get back with

## 2019-03-07 ENCOUNTER — Encounter: Payer: Self-pay | Admitting: Family Medicine

## 2019-03-07 MED ORDER — HYDROCODONE-ACETAMINOPHEN 5-325 MG PO TABS: ORAL_TABLET | ORAL | 0 refills | Status: DC

## 2019-03-08 ENCOUNTER — Other Ambulatory Visit: Payer: Self-pay | Admitting: *Deleted

## 2019-03-08 DIAGNOSIS — S22000A Wedge compression fracture of unspecified thoracic vertebra, initial encounter for closed fracture: Secondary | ICD-10-CM

## 2019-03-08 DIAGNOSIS — M8589 Other specified disorders of bone density and structure, multiple sites: Secondary | ICD-10-CM

## 2019-03-24 MED FILL — PHENTERMINE 37.5 MG TABLET: 37.5 | 30 days supply | Qty: 30 | Fill #2

## 2019-05-04 ENCOUNTER — Other Ambulatory Visit: Payer: Self-pay | Admitting: Family Medicine

## 2019-05-04 MED FILL — DICLOFENAC SODIUM 75 MG TAB: 75 | 30 days supply | Qty: 60 | Fill #0

## 2019-05-11 ENCOUNTER — Telehealth: Payer: Self-pay | Admitting: Family Medicine

## 2019-05-11 NOTE — Telephone Encounter (Signed)
Please sign & date the order for IV Boniva so I may fax for scheduling  In yellow box on wall

## 2019-05-14 ENCOUNTER — Encounter (HOSPITAL_COMMUNITY): Payer: No Typology Code available for payment source

## 2019-05-19 ENCOUNTER — Encounter (HOSPITAL_COMMUNITY)
Admission: RE | Admit: 2019-05-19 | Discharge: 2019-05-19 | Disposition: A | Discharge status: Home / Self Care | Payer: No Typology Code available for payment source | Source: Ambulatory Visit | Attending: Family Medicine | Admitting: Family Medicine

## 2019-05-19 ENCOUNTER — Other Ambulatory Visit: Payer: Self-pay

## 2019-05-19 ENCOUNTER — Encounter (HOSPITAL_COMMUNITY): Payer: Self-pay

## 2019-05-19 DIAGNOSIS — M810 Age-related osteoporosis without current pathological fracture: Secondary | ICD-10-CM | POA: Insufficient documentation

## 2019-05-19 LAB — BASIC METABOLIC PANEL
Anion gap: 8 (ref 5–15)
BUN: 13 mg/dL (ref 8–23)
CO2: 20 mmol/L — ABNORMAL LOW (ref 22–32)
Calcium: 7.5 mg/dL — ABNORMAL LOW (ref 8.9–10.3)
Chloride: 114 mmol/L — ABNORMAL HIGH (ref 98–111)
Creatinine, Ser: 0.42 mg/dL — ABNORMAL LOW (ref 0.44–1.00)
GFR calc Af Amer: >60 mL/min (ref >60)
GFR calc non Af Amer: >60 mL/min (ref >60)
Glucose, Bld: 83 mg/dL (ref 70–99)
Potassium: 3.2 mmol/L — ABNORMAL LOW (ref 3.5–5.1)
Sodium: 142 mmol/L (ref 135–145)

## 2019-05-19 MED ORDER — IBANDRONATE SODIUM 3 MG/3ML IV SOLN
3.0000 mg | Freq: Once | INTRAVENOUS | Status: AC
  Administered 2019-05-19: 10:00:00 3 mg via INTRAVENOUS
  Filled 2019-05-19: qty 3

## 2019-05-19 NOTE — Discharge Instructions (Signed)
Ibandronate injection What is this medicine? IBANDRONATE (i BAN droh nate) slows calcium loss from bones. It is used to treat osteoporosis in women past the age of menopause. This medicine may be used for other purposes; ask your health care provider or pharmacist if you have questions. COMMON BRAND NAME(S): Boniva What should I tell my health care provider before I take this medicine? They need to know if you have any of these conditions:  dental disease  kidney disease  low levels of calcium in the blood  low levels of vitamin D in the blood  an unusual or allergic reaction to ibandronate, other medicines, foods, dyes, or preservatives  pregnant or trying to get pregnant  breast-feeding How should I use this medicine? This medicine is for injection into a vein. It is given by a health care professional in a hospital or clinic setting. Talk to your pediatrician regarding the use of this medicine in children. Special care may be needed. Overdosage: If you think you have taken too much of this medicine contact a poison control center or emergency room at once. NOTE: This medicine is only for you. Do not share this medicine with others. What if I miss a dose? It is important not to miss your dose. Call your doctor or health care professional if you are unable to keep an appointment. What may interact with this medicine?  teriparatide This list may not describe all possible interactions. Give your health care provider a list of all the medicines, herbs, non-prescription drugs, or dietary supplements you use. Also tell them if you smoke, drink alcohol, or use illegal drugs. Some items may interact with your medicine. What should I watch for while using this medicine? Visit your doctor or health care professional for regular check ups. It may be some time before you see the benefit from this medicine. Do not stop taking your medicine except on your doctor's advice. Your doctor or health  care professional may order blood tests and other tests to see how you are doing. You should make sure you get enough calcium and vitamin D while you are taking this medicine, unless your doctor tells you not to. Discuss the foods you eat and the vitamins you take with your health care professional. Some people who take this medicine have severe bone, joint, and/or muscle pain. This medicine may also increase your risk for a broken thigh bone. Tell your doctor right away if you have pain in your upper leg or groin. Tell your doctor if you have any pain that does not go away or that gets worse. What side effects may I notice from receiving this medicine? Side effects that you should report to your doctor or health care professional as soon as possible:  allergic reactions such as skin rash or itching, hives, swelling of the face, lips, throat, or tongue  changes in vision  chest pain  fever, flu-like symptoms  heartburn or stomach pain  jaw pain, especially after dental work Side effects that usually do not require medical attention (report to your doctor or health care professional if they continue or are bothersome):  bone, muscle or joint pain  diarrhea or constipation  eye pain or itching  headache  irritation at site where injected  nausea This list may not describe all possible side effects. Call your doctor for medical advice about side effects. You may report side effects to FDA at 1-800-FDA-1088. Where should I keep my medicine? This drug is given in  a hospital or clinic and will not be stored at home. NOTE: This sheet is a summary. It may not cover all possible information. If you have questions about this medicine, talk to your doctor, pharmacist, or health care provider.  2020 Elsevier/Gold Standard (2010-12-28 09:02:20)

## 2019-05-31 ENCOUNTER — Telehealth: Payer: Self-pay | Admitting: *Deleted

## 2019-05-31 NOTE — Telephone Encounter (Signed)
Message for Dr Richardson Landry

## 2019-07-05 ENCOUNTER — Other Ambulatory Visit (HOSPITAL_COMMUNITY): Payer: Self-pay | Admitting: Family Medicine

## 2019-07-05 ENCOUNTER — Other Ambulatory Visit: Payer: Self-pay | Admitting: Family Medicine

## 2019-07-05 DIAGNOSIS — Z9889 Other specified postprocedural states: Secondary | ICD-10-CM

## 2019-07-05 MED FILL — DICLOFENAC SODIUM 75 MG TAB: 75 | 30 days supply | Qty: 60 | Fill #0

## 2019-07-07 ENCOUNTER — Other Ambulatory Visit: Payer: Self-pay

## 2019-07-07 ENCOUNTER — Ambulatory Visit
Admission: EM | Admit: 2019-07-07 | Discharge: 2019-07-07 | Disposition: A | Discharge status: Home / Self Care | Payer: No Typology Code available for payment source | Attending: Urgent Care | Admitting: Urgent Care

## 2019-07-07 ENCOUNTER — Ambulatory Visit (INDEPENDENT_AMBULATORY_CARE_PROVIDER_SITE_OTHER): Payer: No Typology Code available for payment source | Admitting: Family Medicine

## 2019-07-07 DIAGNOSIS — K529 Noninfective gastroenteritis and colitis, unspecified: Secondary | ICD-10-CM | POA: Diagnosis present

## 2019-07-07 DIAGNOSIS — R519 Headache, unspecified: Secondary | ICD-10-CM | POA: Diagnosis not present

## 2019-07-07 DIAGNOSIS — Z20828 Contact with and (suspected) exposure to other viral communicable diseases: Secondary | ICD-10-CM

## 2019-07-07 DIAGNOSIS — R112 Nausea with vomiting, unspecified: Secondary | ICD-10-CM | POA: Insufficient documentation

## 2019-07-07 DIAGNOSIS — A084 Viral intestinal infection, unspecified: Secondary | ICD-10-CM | POA: Diagnosis not present

## 2019-07-07 DIAGNOSIS — Z20822 Contact with and (suspected) exposure to covid-19: Secondary | ICD-10-CM

## 2019-07-07 DIAGNOSIS — R197 Diarrhea, unspecified: Secondary | ICD-10-CM | POA: Insufficient documentation

## 2019-07-07 DIAGNOSIS — E860 Dehydration: Secondary | ICD-10-CM | POA: Diagnosis present

## 2019-07-07 LAB — POCT URINALYSIS DIP (MANUAL ENTRY)
Bilirubin, UA: NEGATIVE
Glucose, UA: NEGATIVE mg/dL
Nitrite, UA: NEGATIVE
Protein Ur, POC: 30 mg/dL — AB
Spec Grav, UA: 1.02 (ref 1.010–1.025)
Urobilinogen, UA: 0.2 E.U./dL
pH, UA: 7.5 (ref 5.0–8.0)

## 2019-07-07 MED ORDER — LOPERAMIDE HCL 2 MG PO CAPS: 2.0000 mg | ORAL_CAPSULE | Freq: Every day | ORAL | 0 refills | Status: DC | PRN

## 2019-07-07 MED ORDER — PROMETHAZINE HCL 12.5 MG RE SUPP: 25.0000 mg | Freq: Four times a day (QID) | RECTAL | 0 refills | Status: DC | PRN

## 2019-07-07 MED ORDER — ONDANSETRON HCL 4 MG/2ML IJ SOLN
4.0000 mg | Freq: Once | INTRAMUSCULAR | Status: AC
  Administered 2019-07-07: 17:00:00 4 mg via INTRAVENOUS

## 2019-07-07 MED ORDER — ONDANSETRON 8 MG PO TBDP: 8.0000 mg | ORAL_TABLET | Freq: Three times a day (TID) | ORAL | 1 refills | Status: DC | PRN

## 2019-07-07 MED ORDER — KETOROLAC TROMETHAMINE 30 MG/ML IJ SOLN
15.0000 mg | Freq: Once | INTRAMUSCULAR | Status: AC
  Administered 2019-07-07: 15 mg via INTRAVENOUS

## 2019-07-07 MED ORDER — ONDANSETRON HCL 4 MG/2ML IJ SOLN: 4.0000 mg | Freq: Once | INTRAMUSCULAR | Status: DC

## 2019-07-07 MED ORDER — SODIUM CHLORIDE 0.9 % IV BOLUS
1000.0000 mL | Freq: Once | INTRAVENOUS | Status: AC
  Administered 2019-07-07: 17:00:00 1000 mL via INTRAVENOUS

## 2019-07-07 MED ORDER — HYDROCODONE-ACETAMINOPHEN 5-325 MG PO TABS: ORAL_TABLET | ORAL | 0 refills | Status: DC

## 2019-07-07 NOTE — ED Provider Notes (Signed)
[place]   MRN: [id number] DOB: 03/10/1956  Subjective:   Janice Higgins is a 63 y.o. female presenting for acute onset this morning of intractable nausea with vomiting (more than 5 episodes), diarrhea (about 4 episodes). Has now developed a headache. Patient works with [employer].  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  She did reach out to her PCP and they prescribed oral Zofran but she has not been able to take this as she cannot tolerate anything by mouth.  She has tried to drink water but this comes right back up even if she sips it.  She was advised to come in to our clinic for further management.  No current facility-administered medications for this encounter.  Current Outpatient Medications:  .  acetaminophen (TYLENOL) 325 MG tablet, Take 650 mg by mouth every 6 (six) hours as needed., Disp: , Rfl:  .  albuterol (PROVENTIL HFA;VENTOLIN HFA) 108 (90 Base) MCG/ACT inhaler, Inhale 2 puffs into the lungs every 4 (four) hours as needed., Disp: 1 Inhaler, Rfl: 0 .  albuterol (PROVENTIL HFA;VENTOLIN HFA) 108 (90 Base) MCG/ACT inhaler, Inhale 2 puffs into the lungs every 6 (six) hours as needed for wheezing or shortness of breath., Disp: 1 Inhaler, Rfl: 2 .  alendronate (FOSAMAX) 70 MG tablet, Take one tablet po weekly, Disp: 4 tablet, Rfl: 11 .  cyclobenzaprine (FLEXERIL) 10 MG tablet, Take 1 tablet (10 mg total) by mouth 3 (three) times daily as needed for muscle spasms., Disp: 12 tablet, Rfl: 0 .  diclofenac (VOLTAREN) 75 MG EC tablet, TAKE 1 TABLET BY MOUTH TWICE A DAY WITH MEALS, Disp: 60 tablet, Rfl: 0 .  Doxylamine Succinate, Sleep, (SLEEP AID PO), Take 1 tablet by mouth at bedtime as needed., Disp: , Rfl:  .  famotidine (PEPCID) 20 MG tablet, Take 1 tablet (20 mg total) by mouth 2 (two) times daily. Prn acid reflux, Disp: 90 tablet, Rfl: 1 .  HYDROcodone-acetaminophen (NORCO/VICODIN) 5-325 MG tablet, Take  one tablet po q 4-6 hrs prn pain, Disp: 40 tablet, Rfl: 0 .  lidocaine (LIDODERM) 5 %, Apply to affected area for up to 12 hours each day prn pain; then remove and discard, Disp: 15 patch, Rfl: 0 .  ondansetron (ZOFRAN ODT) 8 MG disintegrating tablet, Take 1 tablet (8 mg total) by mouth every 8 (eight) hours as needed for nausea or vomiting., Disp: 15 tablet, Rfl: 1   Allergies  Allergen Reactions  . Fosamax [Alendronate Sodium] Other (See Comments)    Severe GI Upset  . Sulfa Antibiotics Hives    Past Medical History:  Diagnosis Date  . 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 Family history of breast cancer   . History of breast cancer 04/25/2015  . History of bronchitis   . History of kidney stones   . Osteoarthritis    hands and feet  . PMB (postmenopausal bleeding) 04/25/2015  . Radiation 10/13/14-11/04/14   Left breast     Past Surgical History:  Procedure Laterality Date  . BREAST LUMPECTOMY WITH RADIOACTIVE SEED LOCALIZATION Left 08/12/2014   Procedure:  RADIOACTIVE SEED LOCALIZATION LEFT BREAST LUMPECTOMY;  Surgeon: Excell Seltzer, MD;  Location: [facility];  Service: General;  Laterality: Left;  . COLONOSCOPY N/A 05/04/2015   Procedure: COLONOSCOPY;  Surgeon: Rogene Houston, MD;  [facility];  Service: Endoscopy;  Laterality: N/A;  7:30  . FOOT SURGERY Right 1995   exc. bone spur  . LASIK    . RE-EXCISION OF BREAST LUMPECTOMY Left 08/22/2014   Procedure: RE-EXCISION OF LEFT BREAST LUMPECTOMY;  Surgeon: Excell Seltzer, MD;  Location: [facility];  Service: General;  Laterality: Left;  . RE-EXCISION OF BREAST LUMPECTOMY Left 09/05/2014   Procedure: RE-EXCISION OF BREAST LUMPECTOMY;  Surgeon: Excell Seltzer, MD;  Location: [facility];  Service: General;  Laterality: Left;  . TONSILLECTOMY    . TUBAL LIGATION      Family History  Problem Relation Age of Onset  . Hypertension  Father   . Hypertension Mother   . Heart disease Mother 87  . Breast cancer Sister 48  . Other Brother        lived 3 months  . Heart attack Maternal Grandfather   . Emphysema Paternal Grandfather   . COPD Paternal Grandfather     Social History   Tobacco Use  . Smoking status: Never Smoker  . Smokeless tobacco: Never Used  Substance Use Topics  . Alcohol use: No  . Drug use: No    ROS Denies fever, sore throat, cough, chest pain, abdominal pain, bloody stools, painful urination, hematuria, dysuria, flank pain.  Objective:   Vitals: Pulse 77   Temp 98.6 F (37 C) (Oral)   Resp 16   SpO2 94%   Physical Exam Constitutional:      General: She is not in acute distress.    Appearance: Normal appearance. She is well-developed. She is obese. She is ill-appearing. She is not toxic-appearing or diaphoretic.  HENT:     Head: Normocephalic and atraumatic.     Right Ear: External ear normal.     Left Ear: External ear normal.     Nose: Nose normal.     Mouth/Throat:     Mouth: Mucous membranes are moist.     Pharynx: Oropharynx is clear.  Eyes:     General: No scleral icterus.    Extraocular Movements: Extraocular movements intact.     Pupils: Pupils are equal, round, and reactive to light.  Cardiovascular:     Rate and Rhythm: Normal rate and regular rhythm.     Pulses: Normal pulses.     Heart sounds: Normal heart sounds. No murmur. No friction rub. No gallop.   Pulmonary:     Effort: Pulmonary effort is normal. No respiratory distress.     Breath sounds: Normal breath sounds. No stridor. No wheezing, rhonchi or rales.  Abdominal:     General: Bowel sounds are normal. There is no distension.     Palpations: Abdomen is soft. There is no mass.     Tenderness: There is abdominal tenderness (Diffuse and generalized throughout). There is no right CVA tenderness, left CVA tenderness, guarding or rebound.  Skin:    General: Skin is warm and dry.     Coloration: Skin is  not pale.     Findings: No rash.  Neurological:     General: No focal deficit present.     Mental Status: She is alert and oriented to person, place, and time.     Cranial Nerves: No cranial nerve deficit.  Psychiatric:        Mood and Affect: Mood normal.        Behavior: Behavior normal.        Thought Content: Thought content normal.        Judgment: Judgment normal.  1 L of IV fluids given, 4 mg IV Zofran. 15mg  IV Toradol given for headache.  Assessment and Plan :   1. Gastroenteritis   2. Nausea and vomiting, intractability of vomiting not specified, unspecified vomiting type   3. Diarrhea, unspecified type     Will manage for viral gastroenteritis with supportive care.  Recommended patient retry this Zofran that was prescribed to her orally but if she is unable to tolerate this, will have her use Phenergan suppositories.  Light use of Imodium for diarrhea.  Patient is to push fluids and eat light meals including soups and soft foods. PE findings reassuring against acute intracranial process. Counseled patient on potential for adverse effects with medications prescribed/recommended today, ER and return-to-clinic precautions discussed, patient verbalized understanding.    Jaynee Eagles, PA-C 07/07/19 1723

## 2019-07-07 NOTE — Discharge Instructions (Addendum)
Hydrate very well with at least 2 liters of water. Eat light meals such as soups to replenish electrolytes and soft fruits are ok as well. Use phenergan suppository for nausea and vomiting especially if you are having difficulty taking anything in orally. You may take 500mg -650mg  Tylenol every 6 hours for pain, fevers.  You can also use loperamide once a day only if you need it for persistent diarrhea.

## 2019-07-07 NOTE — Progress Notes (Signed)
   Subjective:    Patient ID: Janice Higgins, female    DOB: Jun 16, 1956, 63 y.o.   MRN: [id number]  Headache  This is a new problem. The current episode started today. Associated symptoms comments: Vomiting, has not ate or drink anything all day, diarrhea. Treatments tried: cool rag on forehead; ibuprofen, pain pill. The treatment provided no relief.  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 Patient unfortunately started having this in the middle of the night with nausea and vomiting also having some diarrhea and loose stools also with headache some fever some chills no wheezing or difficulty breathing PMH benign Virtual Visit via Telephone Note  I connected with Demple Cisse on 07/07/19 at  2:00 PM EST by telephone and verified that I am speaking with the correct person using two identifiers.  Location: Patient: home Provider: office   I discussed the limitations, risks, security and privacy concerns of performing an evaluation and management service by telephone and the availability of in person appointments. I also discussed with the patient that there may be a patient responsible charge related to this service. The patient expressed understanding and agreed to proceed.   History of Present Illness:    Observations/Objective:   Assessment and Plan:   Follow Up Instructions:    I discussed the assessment and treatment plan with the patient. The patient was provided an opportunity to ask questions and all were answered. The patient agreed with the plan and demonstrated an understanding of the instructions.   The patient was advised to call back or seek an in-person evaluation if the symptoms worsen or if the condition fails to improve as anticipated.  I provided 15 minutes of non-face-to-face time during this encounter.   Vicente Males, LPN    Review of Systems  Neurological: Positive for headaches.  Positive for nausea and vomiting positive also for loose stools no  wheezing no chills no fever no cough     Objective:   Physical Exam    Today's visit was via telephone Physical exam was not possible for this visit     Assessment & Plan:  Gastroenteritis Covid test pending Zofran for nausea and vomiting If progressive symptoms are worse urgent care or ER for Phenergan shot Warning signs discussed with patient It is possible that the could be Covid going on await the results-patient aware that we will not directly get the results that she will get the results since the testing was done through the workplace If severe shortness of breath or worse immediately go to ER

## 2019-07-07 NOTE — ED Triage Notes (Signed)
Pt presents to UC w/ c/o N/V, headache, diarrhea since this morning. Pt had a covid test this morning, no results yet. Pt states she is not able to keep even a sip of liquid down.

## 2019-07-09 LAB — URINE CULTURE

## 2019-07-20 ENCOUNTER — Ambulatory Visit (INDEPENDENT_AMBULATORY_CARE_PROVIDER_SITE_OTHER): Payer: No Typology Code available for payment source | Admitting: Family Medicine

## 2019-07-20 ENCOUNTER — Other Ambulatory Visit: Payer: Self-pay

## 2019-07-20 DIAGNOSIS — S22000A Wedge compression fracture of unspecified thoracic vertebra, initial encounter for closed fracture: Secondary | ICD-10-CM | POA: Diagnosis not present

## 2019-07-20 DIAGNOSIS — Z79899 Other long term (current) drug therapy: Secondary | ICD-10-CM | POA: Diagnosis not present

## 2019-07-20 DIAGNOSIS — M8589 Other specified disorders of bone density and structure, multiple sites: Secondary | ICD-10-CM

## 2019-07-20 NOTE — Progress Notes (Signed)
   Subjective:  Audio plus videoPatient ID: Janice Higgins, female    DOB: 22-Mar-1956, 64 y.o.   MRN: [id number]  HPIwants to discuss osteoporosis. Not able to take fosamax due to it causing vomiting.  After 4 days of taking boniva iv she started having vomiting and nausea.   Virtual Visit via Telephone Note  I connected with Kirrah Mustin on 07/20/19 at  3:00 PM EST by telephone and verified that I am speaking with the correct person using two identifiers.  Location: Patient: home Provider: office   I discussed the limitations, risks, security and privacy concerns of performing an evaluation and management service by telephone and the availability of in person appointments. I also discussed with the patient that there may be a patient responsible charge related to this service. The patient expressed understanding and agreed to proceed.   History of Present Illness:    Observations/Objective:   Assessment and Plan:   Follow Up Instructions:    I discussed the assessment and treatment plan with the patient. The patient was provided an opportunity to ask questions and all were answered. The patient agreed with the plan and demonstrated an understanding of the instructions.   The patient was advised to call back or seek an in-person evaluation if the symptoms worsen or if the condition fails to improve as anticipated.  I provided 18 minutes of non-face-to-face time during this encounter.        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  Discussed at length.  Patient thinks she may have had GI symptoms after her last injection of Boniva.  Usually this does not occur.  Discussed.  Options discussed.  Patient would like to try the injection once more  2.  Hypokalemia with met 7  changes on last blood work.  Discussed.  Due soon before next will check and further recommendations based on results

## 2019-07-27 ENCOUNTER — Ambulatory Visit (HOSPITAL_COMMUNITY): Admission: RE | Admit: 2019-07-27 | Payer: No Typology Code available for payment source | Source: Ambulatory Visit

## 2019-07-27 ENCOUNTER — Ambulatory Visit (HOSPITAL_COMMUNITY)
Admission: RE | Admit: 2019-07-27 | Discharge: 2019-07-27 | Disposition: A | Discharge status: Home / Self Care | Payer: No Typology Code available for payment source | Source: Ambulatory Visit | Attending: Family Medicine | Admitting: Family Medicine

## 2019-07-27 ENCOUNTER — Other Ambulatory Visit: Payer: Self-pay

## 2019-07-27 ENCOUNTER — Ambulatory Visit (HOSPITAL_COMMUNITY): Payer: No Typology Code available for payment source

## 2019-07-27 DIAGNOSIS — Z9889 Other specified postprocedural states: Secondary | ICD-10-CM | POA: Insufficient documentation

## 2019-07-28 ENCOUNTER — Emergency Department (HOSPITAL_COMMUNITY): Payer: No Typology Code available for payment source

## 2019-07-28 ENCOUNTER — Other Ambulatory Visit: Payer: Self-pay

## 2019-07-28 ENCOUNTER — Encounter (HOSPITAL_COMMUNITY): Payer: Self-pay | Admitting: Emergency Medicine

## 2019-07-28 ENCOUNTER — Emergency Department (HOSPITAL_COMMUNITY)
Admission: EM | Admit: 2019-07-28 | Discharge: 2019-07-28 | Disposition: A | Discharge status: Home / Self Care | Payer: No Typology Code available for payment source | Attending: Emergency Medicine | Admitting: Emergency Medicine

## 2019-07-28 DIAGNOSIS — R519 Headache, unspecified: Secondary | ICD-10-CM | POA: Insufficient documentation

## 2019-07-28 DIAGNOSIS — Z79899 Other long term (current) drug therapy: Secondary | ICD-10-CM | POA: Insufficient documentation

## 2019-07-28 LAB — BASIC METABOLIC PANEL
Anion gap: 10 (ref 5–15)
BUN: 14 mg/dL (ref 8–23)
CO2: 25 mmol/L (ref 22–32)
Calcium: 9.2 mg/dL (ref 8.9–10.3)
Chloride: 104 mmol/L (ref 98–111)
Creatinine, Ser: 0.6 mg/dL (ref 0.44–1.00)
GFR calc Af Amer: >60 mL/min (ref >60)
GFR calc non Af Amer: >60 mL/min (ref >60)
Glucose, Bld: 112 mg/dL — ABNORMAL HIGH (ref 70–99)
Potassium: 3.7 mmol/L (ref 3.5–5.1)
Sodium: 139 mmol/L (ref 135–145)

## 2019-07-28 LAB — CBC
HCT: 44.4 % (ref 36.0–46.0)
Hemoglobin: 14.3 g/dL (ref 12.0–15.0)
MCH: 30.4 pg (ref 26.0–34.0)
MCHC: 32.2 g/dL (ref 30.0–36.0)
MCV: 94.3 fL (ref 80.0–100.0)
Platelets: 285 10*3/uL (ref 150–400)
RBC: 4.71 MIL/uL (ref 3.87–5.11)
RDW: 13.4 % (ref 11.5–15.5)
WBC: 4.9 10*3/uL (ref 4.0–10.5)
nRBC: 0 % (ref 0.0–0.2)

## 2019-07-28 MED ORDER — ONDANSETRON HCL 4 MG/2ML IJ SOLN
4.0000 mg | Freq: Once | INTRAMUSCULAR | Status: AC
  Administered 2019-07-28: 4 mg via INTRAVENOUS
  Filled 2019-07-28: qty 2

## 2019-07-28 MED ORDER — MORPHINE SULFATE (PF) 4 MG/ML IV SOLN
4.0000 mg | Freq: Once | INTRAVENOUS | Status: AC
  Administered 2019-07-28: 4 mg via INTRAVENOUS
  Filled 2019-07-28: qty 1

## 2019-07-28 MED ORDER — SODIUM CHLORIDE 0.9 % IV BOLUS
1000.0000 mL | Freq: Once | INTRAVENOUS | Status: AC
  Administered 2019-07-28: 19:00:00 1000 mL via INTRAVENOUS

## 2019-07-28 NOTE — Discharge Instructions (Signed)
Please follow-up with neurology as soon as possible to schedule an appointment for ongoing evaluation and management of your headache disorder.  Please consider reading the attachment on migraine headaches.  Please continue take your Zofran ODT and pain medication at home for symptomatic relief of your episodic headaches.  Please notify your primary care provider of today's encounter.  Please return to the ED or seek medical attention immediately should you develop any weakness, numbness, blurred vision, uncontrolled nausea or vomiting, fevers or stiff neck, or any other new or worsening symptoms.

## 2019-07-28 NOTE — ED Provider Notes (Signed)
[facility] EMERGENCY DEPARTMENT Provider Note   CSN: [id number] Arrival date & time: 07/28/19  1800     History Chief Complaint  Patient presents with   Headache    Janice Higgins is a 64 y.o. female with PMH significant for breast cancer who presents to the ED with a 77-month history of severe intermittent headaches with associated nausea and vomiting.  Patient reports these headaches have been so severe that they have woken her up from her sleep on multiple occasions.  While patient endorses a history of mild headaches, she reports that these episodes in the past 2 months have been much different and far more severe.  She states that today she has been unable to eat or drink anything to her uncontrolled nausea and vomiting stemming from her headache symptoms.  She has been taking Vicodin, as prescribed by her primary care provider, but with lessening symptomatic control.  No obvious aggravating or alleviating factors.  She works here at the hospital and states that she rarely has had to call to work, but that these symptoms have been overwhelming.  She denies any precipitating trauma, hematemesis, fevers or chills, dizziness, blurred vision or other neurologic symptoms, chest pain or difficulty breathing, or other symptoms.  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   Family history of breast cancer    History of breast cancer 04/25/2015   History of bronchitis    History of kidney stones    Osteoarthritis    hands and feet   PMB (postmenopausal bleeding) 04/25/2015   Radiation 10/13/14-11/04/14   Left breast    Patient Active Problem List   Diagnosis Date Noted   Impaired fasting glucose 12/21/2018   Unilateral primary osteoarthritis, right knee 03/11/2018   Morbid obesity due to excess calories ([place]) 06/16/2015   PMB (postmenopausal bleeding) 04/25/2015   History of  breast cancer 04/25/2015   Breast cancer ([place])    Family history of breast cancer    Breast cancer, left breast ([place]) 08/30/2014   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   HNP (herniated nucleus pulposus), cervical 06/03/2011   OTHER ENTHESOPATHY OF ANKLE AND TARSUS 08/31/2008   PLANTAR FACIITIS 07/20/2008    Past Surgical History:  Procedure Laterality Date   BREAST LUMPECTOMY WITH RADIOACTIVE SEED LOCALIZATION Left 08/12/2014   Procedure:  RADIOACTIVE SEED LOCALIZATION LEFT BREAST LUMPECTOMY;  Surgeon: Excell Seltzer, MD;  Location: [facility];  Service: General;  Laterality: Left;   COLONOSCOPY N/A 05/04/2015   Procedure: COLONOSCOPY;  Surgeon: Rogene Houston, MD;  Location[facility];  Service: Endoscopy;  Laterality: N/A;  7:30   FOOT SURGERY Right 1995   exc. bone spur   LASIK     RE-EXCISION OF BREAST LUMPECTOMY Left 08/22/2014   Procedure: RE-EXCISION OF LEFT BREAST LUMPECTOMY;  Surgeon: Excell Seltzer, MD;  Location: [facility] OR;  Service: General;  Laterality: Left;   RE-EXCISION OF BREAST LUMPECTOMY Left 09/05/2014   Procedure: RE-EXCISION OF BREAST LUMPECTOMY;  Surgeon: Excell Seltzer, MD;  Location: [facility];  Service: General;  Laterality: Left;   TONSILLECTOMY     TUBAL LIGATION       OB History    Gravida  1   Para  1   Term      Preterm      AB  Living  1     SAB      TAB      Ectopic      Multiple      Live Births              Family History  Problem Relation Age of Onset   Hypertension Father    Hypertension Mother    Heart disease Mother 40   Breast cancer Sister 36   Other Brother        lived 3 months   Heart attack Maternal Grandfather    Emphysema Paternal Grandfather    COPD Paternal Grandfather     Social History   Tobacco Use   Smoking status: Never Smoker   Smokeless tobacco: Never Used  Substance Use Topics   Alcohol use: No   Drug use:  No    Home Medications Prior to Admission medications   Medication Sig Start Date End Date Taking? Authorizing Provider  acetaminophen (TYLENOL) 325 MG tablet Take 650 mg by mouth every 6 (six) hours as needed.    [provider]  albuterol (PROVENTIL HFA;VENTOLIN HFA) 108 (90 Base) MCG/ACT inhaler Inhale 2 puffs into the lungs every 4 (four) hours as needed. 07/29/16   Mikey Kirschner, MD  albuterol (PROVENTIL HFA;VENTOLIN HFA) 108 (90 Base) MCG/ACT inhaler Inhale 2 puffs into the lungs every 6 (six) hours as needed for wheezing or shortness of breath. 06/23/18   Mikey Kirschner, MD  cyclobenzaprine (FLEXERIL) 10 MG tablet Take 1 tablet (10 mg total) by mouth 3 (three) times daily as needed for muscle spasms. 02/01/19   Waldon Merl, PA-C  diclofenac (VOLTAREN) 75 MG EC tablet TAKE 1 TABLET BY MOUTH TWICE A DAY WITH MEALS 07/05/19   Mikey Kirschner, MD  Doxylamine Succinate, Sleep, (SLEEP AID PO) Take 1 tablet by mouth at bedtime as needed.    [provider]  famotidine (PEPCID) 20 MG tablet Take 1 tablet (20 mg total) by mouth 2 (two) times daily. Prn acid reflux 12/21/18   Nilda Simmer, NP  HYDROcodone-acetaminophen (NORCO/VICODIN) 5-325 MG tablet Take one tablet po q 4-6 hrs prn pain 07/07/19   Kathyrn Drown, MD  lidocaine (LIDODERM) 5 % Apply to affected area for up to 12 hours each day prn pain; then remove and discard 02/08/19   Nilda Simmer, NP    Allergies    Fosamax [alendronate sodium] and Sulfa antibiotics  Review of Systems   Review of Systems  Constitutional: Negative for chills and fever.  Neurological: Positive for headaches. Negative for speech difficulty, weakness and numbness.  Hematological: Does not bruise/bleed easily.  All other systems reviewed and are negative.   Physical Exam Updated Vital Signs BP 111/76 (BP Location: Right Arm)    Pulse 78    Temp 98.2 F (36.8 C) (Oral)    Resp 16    Ht 5\' 2"  (1.575 m)    Wt 73.9 kg     SpO2 95%    BMI 29.81 kg/m   Physical Exam Vitals and nursing note reviewed. Exam conducted with a chaperone present.  Constitutional:      Appearance: Normal appearance.  HENT:     Head: Normocephalic and atraumatic.  Eyes:     General: No scleral icterus.    Extraocular Movements: Extraocular movements intact.     Conjunctiva/sclera: Conjunctivae normal.     Pupils: Pupils are equal, round, and reactive to light.  Neck:     Meningeal:  Brudzinski's sign absent.  Cardiovascular:     Rate and Rhythm: Normal rate and regular rhythm.     Pulses: Normal pulses.     Heart sounds: Normal heart sounds.  Pulmonary:     Effort: Pulmonary effort is normal.     Breath sounds: Normal breath sounds.  Musculoskeletal:        General: Normal range of motion.     Cervical back: Normal range of motion and neck supple. No rigidity.  Skin:    General: Skin is dry.     Capillary Refill: Capillary refill takes less than 2 seconds.  Neurological:     General: No focal deficit present.     Mental Status: She is alert and oriented to person, place, and time.     GCS: GCS eye subscore is 4. GCS verbal subscore is 5. GCS motor subscore is 6.     Cranial Nerves: No cranial nerve deficit.     Sensory: No sensory deficit.     Motor: No weakness.     Coordination: Coordination normal.     Gait: Gait normal.     Comments: Negative Romberg and cerebellar exams.  Psychiatric:        Mood and Affect: Mood normal.        Behavior: Behavior normal.        Thought Content: Thought content normal.     ED Results / Procedures / Treatments   Labs (all labs ordered are listed, but only abnormal results are displayed) Labs Reviewed  BASIC METABOLIC PANEL - Abnormal; Notable for the following components:      Result Value   Glucose, Bld 112 (*)    All other components within normal limits  CBC    EKG None  Radiology CT Head Wo Contrast  Result Date: 07/28/2019 CLINICAL DATA:  64 year old female  with headache. History of breast cancer. Evaluate for metastatic disease. EXAM: CT HEAD WITHOUT CONTRAST TECHNIQUE: Contiguous axial images were obtained from the base of the skull through the vertex without intravenous contrast. COMPARISON:  None. FINDINGS: Brain: The ventricles and sulci appropriate size for patient's age. The gray-white matter discrimination is preserved. There is no acute intracranial hemorrhage. No mass effect or midline shift. No extra-axial fluid collection. Vascular: No hyperdense vessel or unexpected calcification. Skull: Normal. Negative for fracture or focal lesion. Sinuses/Orbits: No acute finding. Other: None IMPRESSION: No acute intracranial pathology.  No mass effect or edema. Electronically Signed   By: Anner Crete M.D.   On: 07/28/2019 20:01   MM DIAG BREAST TOMO BILATERAL  Result Date: 07/27/2019 CLINICAL DATA:  64 year old female with history of left breast cancer post lumpectomy 08/12/2014 followed by radiation therapy. EXAM: DIGITAL DIAGNOSTIC BILATERAL MAMMOGRAM WITH CAD AND TOMO COMPARISON:  Previous exam(s). ACR Breast Density Category b: There are scattered areas of fibroglandular density. FINDINGS: No suspicious masses or calcifications are seen in either breast. Lumpectomy changes again identified within the upper-outer left breast. Spot compression magnification cc view of the left breast lumpectomy site was performed. There is no mammographic evidence of locally recurrent malignancy. Mammographic images were processed with CAD. IMPRESSION: No mammographic evidence of malignancy in either breast. RECOMMENDATION: Screening mammogram in one year.(Code:SM-B-01Y) I have discussed the findings and recommendations with the patient. If applicable, a reminder letter will be sent to the patient regarding the next appointment. BI-RADS CATEGORY  2: Benign. Electronically Signed   By: Everlean Alstrom M.D.   On: 07/27/2019 14:05    Procedures Procedures (including  critical care time)  Medications Ordered in ED Medications  sodium chloride 0.9 % bolus 1,000 mL (1,000 mLs Intravenous New Bag/Given 07/28/19 1923)  morphine 4 MG/ML injection 4 mg (4 mg Intravenous Given 07/28/19 1922)  ondansetron (ZOFRAN) injection 4 mg (4 mg Intravenous Given 07/28/19 1922)    ED Course  I have reviewed the triage vital signs and the nursing notes.  Pertinent labs & imaging results that were available during my care of the patient were reviewed by me and considered in my medical decision making (see chart for details).    MDM Rules/Calculators/A&P                      Given patient's history of breast cancer in conjunction with her severe headaches that have woken her up from her sleep on numerous occasions with associated nausea and vomiting , will obtain noncontrast head CT to rule out any type of malignancy, mass, or abscesses.  Low suspicion for acute bleed.  She denies any precipitating trauma.  Patient is neurologically intact.  Negative Romberg and cerebellar exams.    Low suspicion for idiopathic intracranial hypertension given her lack of classic risk factors such as obesity, pregnancy, OCPs, and others.  Do not suspect SAH and LP would not be required if noncontrast head CT is negative.  Patient denies any fevers or chills and there is no meningismus on my exam that might otherwise be concerning for meningitis.  CT head without contrast demonstrates no evidence of an acute intracranial abnormality.  BMP is reassuring and CBC is all within normal limits.  Patient is hemodynamically stable.  She feels that she has improved from 9 out of 10 to 2 out of 10 with fluids, IV morphine, and Zofran.    She reports that these episodes occur approximately once every 1 to 2 weeks and are extremely debilitating.  She endorses photosensitivity in addition to her nausea symptoms and they are suggestive of migraine.  Will refer to neurology for ongoing evaluation management.   Patient initially had small emesis after consuming [place], but then was able to drink a full glass of water and denied any nausea symptoms.  Tolerating p.o. well and feels prepared for discharge.  Return precautions discussed. All of the evaluation and work-up results were discussed with the patient and any family at bedside. They were provided opportunity to ask any additional questions and have none at this time. They have expressed understanding of verbal discharge instructions as well as return precautions and are agreeable to the plan.     Final Clinical Impression(s) / ED Diagnoses Final diagnoses:  Nonintractable episodic headache, unspecified headache type    Rx / DC Orders ED Discharge Orders    None       Reita Chard 07/28/19 2111    Noemi Chapel, MD 07/29/19 [phone]

## 2019-07-28 NOTE — ED Triage Notes (Signed)
Patient reports severe headaches since starting Boniva two months ago. Patient reports nausea and vomiting with the headaches.

## 2019-08-12 ENCOUNTER — Other Ambulatory Visit: Payer: Self-pay | Admitting: *Deleted

## 2019-08-12 ENCOUNTER — Telehealth: Payer: Self-pay | Admitting: *Deleted

## 2019-08-12 DIAGNOSIS — Z79899 Other long term (current) drug therapy: Secondary | ICD-10-CM

## 2019-08-12 NOTE — Telephone Encounter (Signed)
Pt in reminder file Please arrange for Renal Panel @ [facility] first of week of Feb. Next [facility] is scheduled for 2/4 and we need results prior to injection. per dr Richardson Landry. I put in orders and notified pt.

## 2019-08-19 ENCOUNTER — Encounter (HOSPITAL_COMMUNITY): Payer: No Typology Code available for payment source

## 2019-08-19 ENCOUNTER — Inpatient Hospital Stay (HOSPITAL_COMMUNITY): Admission: RE | Admit: 2019-08-19 | Payer: No Typology Code available for payment source | Source: Ambulatory Visit

## 2019-08-19 ENCOUNTER — Encounter: Payer: Self-pay | Admitting: Family Medicine

## 2019-08-20 ENCOUNTER — Other Ambulatory Visit: Payer: Self-pay | Admitting: Family Medicine

## 2019-08-24 MED FILL — ALBUTEROL SULFATE HFA 108 (: 108 (90 BAS | 25 days supply | Qty: 18 | Fill #0

## 2019-09-13 ENCOUNTER — Encounter (HOSPITAL_COMMUNITY): Payer: Self-pay

## 2019-09-13 ENCOUNTER — Other Ambulatory Visit: Payer: Self-pay

## 2019-09-13 ENCOUNTER — Encounter (HOSPITAL_COMMUNITY)
Admission: RE | Admit: 2019-09-13 | Discharge: 2019-09-13 | Disposition: A | Discharge status: Home / Self Care | Payer: No Typology Code available for payment source | Source: Ambulatory Visit | Attending: Family Medicine | Admitting: Family Medicine

## 2019-09-13 DIAGNOSIS — M810 Age-related osteoporosis without current pathological fracture: Secondary | ICD-10-CM | POA: Insufficient documentation

## 2019-09-13 LAB — POCT I-STAT, CHEM 8
BUN: 12 mg/dL (ref 8–23)
Calcium, Ion: 1.14 mmol/L — ABNORMAL LOW (ref 1.15–1.40)
Chloride: 107 mmol/L (ref 98–111)
Creatinine, Ser: 0.5 mg/dL (ref 0.44–1.00)
Glucose, Bld: 95 mg/dL (ref 70–99)
HCT: 39 % (ref 36.0–46.0)
Hemoglobin: 13.3 g/dL (ref 12.0–15.0)
Potassium: 3.4 mmol/L — ABNORMAL LOW (ref 3.5–5.1)
Sodium: 143 mmol/L (ref 135–145)
TCO2: 23 mmol/L (ref 22–32)

## 2019-09-13 MED ORDER — IBANDRONATE SODIUM 3 MG/3ML IV SOLN
3.0000 mg | Freq: Once | INTRAVENOUS | Status: AC
  Administered 2019-09-13: 3 mg via INTRAVENOUS

## 2019-10-12 ENCOUNTER — Other Ambulatory Visit: Payer: Self-pay | Admitting: Family Medicine

## 2019-10-12 MED FILL — DICLOFENAC SOD EC 75 MG TAB: 75 | 30 days supply | Qty: 60 | Fill #0

## 2019-12-09 ENCOUNTER — Other Ambulatory Visit: Payer: Self-pay | Admitting: Family Medicine

## 2019-12-09 MED FILL — DICLOFENAC SOD EC 75 MG TAB: 75 | 30 days supply | Qty: 60 | Fill #0

## 2019-12-14 ENCOUNTER — Encounter (HOSPITAL_COMMUNITY)
Admission: RE | Admit: 2019-12-14 | Discharge: 2019-12-14 | Disposition: A | Discharge status: Home / Self Care | Payer: No Typology Code available for payment source | Source: Ambulatory Visit | Attending: Family Medicine | Admitting: Family Medicine

## 2019-12-14 ENCOUNTER — Encounter (HOSPITAL_COMMUNITY): Payer: Self-pay

## 2019-12-14 ENCOUNTER — Other Ambulatory Visit: Payer: Self-pay

## 2019-12-14 DIAGNOSIS — M810 Age-related osteoporosis without current pathological fracture: Secondary | ICD-10-CM | POA: Insufficient documentation

## 2019-12-14 LAB — POCT I-STAT, CHEM 8
BUN: 16 mg/dL (ref 8–23)
Calcium, Ion: 0.96 mmol/L — ABNORMAL LOW (ref 1.15–1.40)
Chloride: 109 mmol/L (ref 98–111)
Creatinine, Ser: 0.5 mg/dL (ref 0.44–1.00)
Glucose, Bld: 97 mg/dL (ref 70–99)
HCT: 40 % (ref 36.0–46.0)
Hemoglobin: 13.6 g/dL (ref 12.0–15.0)
Potassium: 3.7 mmol/L (ref 3.5–5.1)
Sodium: 138 mmol/L (ref 135–145)
TCO2: 24 mmol/L (ref 22–32)

## 2019-12-14 MED ORDER — IBANDRONATE SODIUM 3 MG/3ML IV SOLN
3.0000 mg | Freq: Once | INTRAVENOUS | Status: AC
  Administered 2019-12-14: 3 mg via INTRAVENOUS

## 2019-12-27 ENCOUNTER — Other Ambulatory Visit: Payer: Self-pay

## 2019-12-27 ENCOUNTER — Ambulatory Visit (INDEPENDENT_AMBULATORY_CARE_PROVIDER_SITE_OTHER): Payer: No Typology Code available for payment source | Admitting: Family Medicine

## 2019-12-27 ENCOUNTER — Encounter: Payer: Self-pay | Admitting: Family Medicine

## 2019-12-27 VITALS — BP 134/86 | HR 99 | Temp 98.4°F | Wt 177.4 lb

## 2019-12-27 DIAGNOSIS — Z124 Encounter for screening for malignant neoplasm of cervix: Secondary | ICD-10-CM | POA: Diagnosis not present

## 2019-12-27 DIAGNOSIS — M25471 Effusion, right ankle: Secondary | ICD-10-CM

## 2019-12-27 DIAGNOSIS — Z8616 Personal history of COVID-19: Secondary | ICD-10-CM | POA: Diagnosis not present

## 2019-12-27 DIAGNOSIS — Z853 Personal history of malignant neoplasm of breast: Secondary | ICD-10-CM

## 2019-12-27 DIAGNOSIS — G8929 Other chronic pain: Secondary | ICD-10-CM

## 2019-12-27 DIAGNOSIS — M545 Low back pain, unspecified: Secondary | ICD-10-CM

## 2019-12-27 DIAGNOSIS — Z01419 Encounter for gynecological examination (general) (routine) without abnormal findings: Secondary | ICD-10-CM

## 2019-12-27 MED ORDER — HYDROCODONE-ACETAMINOPHEN 5-325 MG PO TABS: ORAL_TABLET | ORAL | 0 refills | Status: DC

## 2019-12-27 NOTE — Progress Notes (Signed)
Patient ID: Janice Higgins, female    DOB: 1956-06-17, 64 y.o.   MRN: [id number]   Chief Complaint  Patient presents with  . Annual Exam   Subjective:    HPI The patient comes in today for a wellness visit.  A review of their health history was completed.  A review of medications was also completed.  Any needed refills; Hydrocodone  Eating habits: not great  Falls/  MVA accidents in past few months: none  Regular exercise: walking at work   Specialist pt sees on regular basis: none  Preventative health issues were discussed.   Additional concerns: Complains of SOB w/exertion since having covid.  Weight gain- about 30 lbs in past year since the pandemic.  Not watching her diet.  Not able to exercise much due to the rt ankle.  Right ankle pain x 6 months, has had h/o fracture in that ankle as a teenager.  No new trauma.  Some swelling over the lateral rt ankle.  Has been standing and walking more at work.    Pt stating she uses hydrocodone a few tablets prn at night 30 tabs prn during the year for low back pain at night when she's "over done it at work."   [place] has a past medical history of Breast cancer ([place]) (2015), Ductal carcinoma in situ (DCIS) of left breast (07/2014), Family history of breast cancer, History of breast cancer (04/25/2015), History of bronchitis, History of kidney stones, Osteoarthritis, PMB (postmenopausal bleeding) (04/25/2015), and Radiation (10/13/14-11/04/14).   Outpatient Encounter Medications as of 12/27/2019  Medication Sig  . diclofenac (VOLTAREN) 75 MG EC tablet TAKE 1 TABLET BY MOUTH TWICE A DAY WITH MEALS  . acetaminophen (TYLENOL) 325 MG tablet Take 650 mg by mouth every 6 (six) hours as needed.  Marland Kitchen albuterol (PROVENTIL HFA;VENTOLIN HFA) 108 (90 Base) MCG/ACT inhaler Inhale 2 puffs into the lungs every 4 (four) hours as needed.  . Doxylamine Succinate, Sleep, (SLEEP AID PO) Take 1 tablet by mouth at bedtime as needed.  .  famotidine (PEPCID) 20 MG tablet Take 1 tablet (20 mg total) by mouth 2 (two) times daily. Prn acid reflux  . ibandronate (BONIVA) 3 MG/3ML SOLN injection Inject 3 mg into the vein every 3 (three) months.  . VENTOLIN HFA 108 (90 Base) MCG/ACT inhaler INHALE 2 PUFFS BY MOUTH INTO THE LUNGS EVERY 6 HOURS AS NEEDED FOR WHEEZING OR SHORTNESS OF BREATH.  . [DISCONTINUED] cyclobenzaprine (FLEXERIL) 10 MG tablet Take 1 tablet (10 mg total) by mouth 3 (three) times daily as needed for muscle spasms. (Patient not taking: Reported on 12/27/2019)  . [DISCONTINUED] HYDROcodone-acetaminophen (NORCO/VICODIN) 5-325 MG tablet Take one tablet po q 4-6 hrs prn pain  . [DISCONTINUED] HYDROcodone-acetaminophen (NORCO/VICODIN) 5-325 MG tablet Take one tablet po qhs prn pain  . [DISCONTINUED] lidocaine (LIDODERM) 5 % Apply to affected area for up to 12 hours each day prn pain; then remove and discard   No facility-administered encounter medications on file as of 12/27/2019.     Review of Systems  Constitutional: Positive for unexpected weight change. Negative for chills and fever.  HENT: Negative for congestion, rhinorrhea and sore throat.   Respiratory: Negative for cough, shortness of breath and wheezing.   Cardiovascular: Negative for chest pain and leg swelling.  Gastrointestinal: Negative for abdominal pain, diarrhea, nausea and vomiting.  Genitourinary: Negative for dysuria and frequency.  Musculoskeletal: Positive for arthralgias (rt ankle swelling/pain). Negative for back pain.  Skin: Negative for rash.  Neurological: Negative for dizziness, weakness and headaches.     Vitals BP 134/86   Pulse 99   Temp 98.4 F (36.9 C)   Wt 177 lb 6.4 oz (80.5 kg)   SpO2 100%   BMI 32.45 kg/m   Objective:   Physical Exam Vitals and nursing note reviewed. Exam conducted with a chaperone present.  Constitutional:      Appearance: Normal appearance.  HENT:     Head: Normocephalic and atraumatic.     Nose:  Nose normal.     Mouth/Throat:     Mouth: Mucous membranes are moist.     Pharynx: Oropharynx is clear.  Eyes:     Extraocular Movements: Extraocular movements intact.     Conjunctiva/sclera: Conjunctivae normal.     Pupils: Pupils are equal, round, and reactive to light.  Cardiovascular:     Rate and Rhythm: Normal rate and regular rhythm.     Pulses: Normal pulses.     Heart sounds: Normal heart sounds.  Pulmonary:     Effort: Pulmonary effort is normal.     Breath sounds: Normal breath sounds. No wheezing, rhonchi or rales.  Genitourinary:    General: Normal vulva.     Exam position: Lithotomy position.     Labia:        Right: No rash or tenderness.        Left: No rash or tenderness.      Comments: +rectocele. No CMT, cervical lesions, or masses. Normal palpation to bladder and uterus. Musculoskeletal:        General: Swelling (rt ankle, non-pitting) present. Normal range of motion.     Right lower leg: No edema.     Left lower leg: No edema.  Skin:    General: Skin is warm and dry.     Findings: No lesion or rash.  Neurological:     General: No focal deficit present.     Mental Status: She is alert and oriented to person, place, and time.  Psychiatric:        Mood and Affect: Mood normal.        Behavior: Behavior normal.      Assessment and Plan   1. Well woman exam with routine gynecological exam  2. History of COVID-19  3. Screening for cervical cancer - IGP, Aptima HPV  4. Chronic low back pain, unspecified back pain laterality, unspecified whether sciatica present  5. History of breast cancer  6. Morbid obesity due to excess calories ([place])  7. Ankle swelling, right   -rt ankle swelling- h/o trauma to ankle as a teen.  possible arthritis.  Cont to monitor.  If continuing pain, then will order xray. -cont otc pain meds, rest, ice, and elevate.  Intermittent sciatica, uses norco prn when it flares up.   F/u 63mo or prn.

## 2019-12-30 LAB — IGP, APTIMA HPV: HPV Aptima: NEGATIVE

## 2019-12-31 ENCOUNTER — Telehealth: Payer: Self-pay | Admitting: Family Medicine

## 2019-12-31 ENCOUNTER — Other Ambulatory Visit: Payer: Self-pay | Admitting: Family Medicine

## 2019-12-31 DIAGNOSIS — Z131 Encounter for screening for diabetes mellitus: Secondary | ICD-10-CM

## 2019-12-31 DIAGNOSIS — Z79899 Other long term (current) drug therapy: Secondary | ICD-10-CM

## 2019-12-31 DIAGNOSIS — M545 Low back pain, unspecified: Secondary | ICD-10-CM

## 2019-12-31 DIAGNOSIS — G8929 Other chronic pain: Secondary | ICD-10-CM

## 2019-12-31 DIAGNOSIS — Z01419 Encounter for gynecological examination (general) (routine) without abnormal findings: Secondary | ICD-10-CM

## 2019-12-31 DIAGNOSIS — Z13220 Encounter for screening for lipoid disorders: Secondary | ICD-10-CM

## 2019-12-31 MED ORDER — HYDROCODONE-ACETAMINOPHEN 5-325 MG PO TABS: ORAL_TABLET | ORAL | 0 refills | Status: DC

## 2019-12-31 NOTE — Telephone Encounter (Signed)
PA attempted through cover my meds for Hydrocodone 5-325 mg. Await decision.

## 2019-12-31 NOTE — Telephone Encounter (Signed)
PA not needed for dosage and strength of med. Contacted pharmacy. Pharmacy needs script sent in again.   Pharmacy states that if this is patient first time or if it has been while, they can only dispense 5 days worth.   Please advise. Thank you

## 2019-12-31 NOTE — Telephone Encounter (Signed)
Pt is aware.  

## 2020-01-01 DIAGNOSIS — G8929 Other chronic pain: Secondary | ICD-10-CM | POA: Insufficient documentation

## 2020-01-07 LAB — CBC WITH DIFFERENTIAL/PLATELET
Basophils Absolute: 0.1 10*3/uL (ref 0.0–0.2)
Basos: 1 %
EOS (ABSOLUTE): 0.4 10*3/uL (ref 0.0–0.4)
Eos: 6 %
Hematocrit: 42.1 % (ref 34.0–46.6)
Hemoglobin: 14.2 g/dL (ref 11.1–15.9)
Immature Grans (Abs): 0 10*3/uL (ref 0.0–0.1)
Immature Granulocytes: 0 %
Lymphocytes Absolute: 2.1 10*3/uL (ref 0.7–3.1)
Lymphs: 33 %
MCH: 30.9 pg (ref 26.6–33.0)
MCHC: 33.7 g/dL (ref 31.5–35.7)
MCV: 92 fL (ref 79–97)
Monocytes Absolute: 0.6 10*3/uL (ref 0.1–0.9)
Monocytes: 9 %
Neutrophils Absolute: 3.1 10*3/uL (ref 1.4–7.0)
Neutrophils: 51 %
Platelets: 334 10*3/uL (ref 150–450)
RBC: 4.59 x10E6/uL (ref 3.77–5.28)
RDW: 12.7 % (ref 11.7–15.4)
WBC: 6.2 10*3/uL (ref 3.4–10.8)

## 2020-01-07 LAB — COMPREHENSIVE METABOLIC PANEL
ALT: 23 IU/L (ref 0–32)
AST: 20 IU/L (ref 0–40)
Albumin/Globulin Ratio: 2.2 (ref 1.2–2.2)
Albumin: 4.8 g/dL (ref 3.8–4.8)
Alkaline Phosphatase: 61 IU/L (ref 48–121)
BUN/Creatinine Ratio: 26 (ref 12–28)
BUN: 19 mg/dL (ref 8–27)
Bilirubin Total: 0.4 mg/dL (ref 0.0–1.2)
CO2: 23 mmol/L (ref 20–29)
Calcium: 9.5 mg/dL (ref 8.7–10.3)
Chloride: 105 mmol/L (ref 96–106)
Creatinine, Ser: 0.74 mg/dL (ref 0.57–1.00)
GFR calc Af Amer: 100 mL/min/{1.73_m2} (ref >59)
GFR calc non Af Amer: 86 mL/min/{1.73_m2} (ref >59)
Globulin, Total: 2.2 g/dL (ref 1.5–4.5)
Glucose: 105 mg/dL — ABNORMAL HIGH (ref 65–99)
Potassium: 4.4 mmol/L (ref 3.5–5.2)
Sodium: 141 mmol/L (ref 134–144)
Total Protein: 7 g/dL (ref 6.0–8.5)

## 2020-01-07 LAB — LIPID PANEL
Chol/HDL Ratio: 2.5 ratio (ref 0.0–4.4)
Cholesterol, Total: 161 mg/dL (ref 100–199)
HDL: 65 mg/dL (ref >39)
LDL Chol Calc (NIH): 82 mg/dL (ref 0–99)
Triglycerides: 71 mg/dL (ref 0–149)
VLDL Cholesterol Cal: 14 mg/dL (ref 5–40)

## 2020-01-07 LAB — HEMOGLOBIN A1C
Est. average glucose Bld gHb Est-mCnc: 120 mg/dL
Hgb A1c MFr Bld: 5.8 % — ABNORMAL HIGH (ref 4.8–5.6)

## 2020-02-15 ENCOUNTER — Other Ambulatory Visit: Payer: Self-pay | Admitting: Family Medicine

## 2020-02-15 ENCOUNTER — Encounter: Payer: Self-pay | Admitting: Family Medicine

## 2020-02-15 ENCOUNTER — Other Ambulatory Visit: Payer: Self-pay

## 2020-02-15 ENCOUNTER — Ambulatory Visit (INDEPENDENT_AMBULATORY_CARE_PROVIDER_SITE_OTHER): Payer: No Typology Code available for payment source | Admitting: Family Medicine

## 2020-02-15 VITALS — BP 134/86 | HR 98 | Temp 98.1°F | Ht 62.0 in | Wt 178.8 lb

## 2020-02-15 DIAGNOSIS — K219 Gastro-esophageal reflux disease without esophagitis: Secondary | ICD-10-CM | POA: Diagnosis not present

## 2020-02-15 DIAGNOSIS — M7918 Myalgia, other site: Secondary | ICD-10-CM | POA: Diagnosis not present

## 2020-02-15 MED ORDER — FAMOTIDINE 20 MG PO TABS: 20.0000 mg | ORAL_TABLET | Freq: Two times a day (BID) | ORAL | 1 refills | Status: DC

## 2020-02-15 MED FILL — FAMOTIDINE 20 MG TABS: 20 | 45 days supply | Qty: 90 | Fill #0

## 2020-02-15 NOTE — Progress Notes (Signed)
Patient ID: Janice Higgins, female    DOB: 16-Aug-1955, 64 y.o.   MRN: [id number]   Chief Complaint  Patient presents with  . Back Pain   Subjective:    HPI Pt having back pain that started Friday. Pt chronic back pain and is on Boniva injections. Pt states that the pain is under left shoulder blade. Every now and then has a sharp pain under left breast. Pt did use heating pad and pain med.   Feels like this is muscular in nature. Denies radiation of pain or numbness or weakness.    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   Outpatient Encounter Medications as of 02/15/2020  Medication Sig  . acetaminophen (TYLENOL) 325 MG tablet Take 650 mg by mouth every 6 (six) hours as needed.  Marland Kitchen albuterol (PROVENTIL HFA;VENTOLIN HFA) 108 (90 Base) MCG/ACT inhaler Inhale 2 puffs into the lungs every 4 (four) hours as needed.  . diclofenac (VOLTAREN) 75 MG EC tablet TAKE 1 TABLET BY MOUTH TWICE A DAY WITH MEALS  . Doxylamine Succinate, Sleep, (SLEEP AID PO) Take 1 tablet by mouth at bedtime as needed.  . famotidine (PEPCID) 20 MG tablet Take 1 tablet (20 mg total) by mouth 2 (two) times daily. Prn acid reflux  . HYDROcodone-acetaminophen (NORCO/VICODIN) 5-325 MG tablet Take one tablet po qhs prn pain  . ibandronate (BONIVA) 3 MG/3ML SOLN injection Inject 3 mg into the vein every 3 (three) months.  . [DISCONTINUED] VENTOLIN HFA 108 (90 Base) MCG/ACT inhaler INHALE 2 PUFFS BY MOUTH INTO THE LUNGS EVERY 6 HOURS AS NEEDED FOR WHEEZING OR SHORTNESS OF BREATH.   No facility-administered encounter medications on file as of 02/15/2020.     Review of Systems  Constitutional: Positive for activity change.  Negative for chills, fever and unexpected weight change.       Pushes heavy linen carts at work.  HENT: Negative.   Eyes: Negative.   Respiratory: Negative.   Cardiovascular: Negative.   Gastrointestinal: Negative.   Endocrine: Negative.   Genitourinary: Negative.   Musculoskeletal: Negative for gait problem, joint swelling, neck pain and neck stiffness.       Pain under left shoulder blade.   Skin: Negative.   Allergic/Immunologic: Negative.   Neurological: Negative for dizziness, weakness, numbness and headaches.       Muscular pain upon palpation.  Hematological: Negative.   Psychiatric/Behavioral: Negative.      Vitals BP 134/86   Pulse 98   Temp 98.1 F (36.7 C)   Ht 5\' 2"  (1.575 m)   Wt 178 lb 12.8 oz (81.1 kg)   SpO2 96%   BMI 32.70 kg/m   Objective:   Physical Exam Constitutional:      Appearance: Normal appearance.  Cardiovascular:     Rate and Rhythm: Normal rate and regular rhythm.     Pulses: Normal pulses.     Heart sounds: Normal heart sounds.  Pulmonary:     Effort: Pulmonary effort is normal.     Breath sounds: Normal breath sounds.  Musculoskeletal:        General: Normal range of motion.     Cervical back: Normal range of motion.  Skin:    General: Skin is warm and dry.  Neurological:  General: No focal deficit present.     Mental Status: She is alert and oriented to person, place, and time.  Psychiatric:        Mood and Affect: Mood normal.        Behavior: Behavior normal.      Assessment and Plan    1. Rhomboid muscle pain Use Icy Hot OTC as needed for pain Use heating pad while awake as needed for comfort Limiting heavy pushing at work if possible. Gentle streches and exercises as instructed.  If you develop numbness, tingling, or weakness develop please return immediately or go to urgent care. Patient understands plan and agrees with plan. Use caution with heating pad and do not apply directly to skin and do not fall asleep  using heating pad.   2. Gastroesophageal reflux disease without esophagitis Received prescription from a previous provider at [facility] and was not able to pick it up at the pharmacy.  Famotidine sent to pharmacy as requested.   Follow-up as needed if symptoms do not resolve or anything changes.  Pecolia Ades, NP

## 2020-02-15 NOTE — Patient Instructions (Addendum)
Use Icy Hot OTC on muscular pain on shoulder blade area. Use heating pad while awake as needed. Limit pushing heavy linen cart at work as much as possible.   Shoulder Exercises Ask your health care provider which exercises are safe for you. Do exercises exactly as told by your health care provider and adjust them as directed. It is normal to feel mild stretching, pulling, tightness, or discomfort as you do these exercises. Stop right away if you feel sudden pain or your pain gets worse. Do not begin these exercises until told by your health care provider. Stretching exercises External rotation and abduction This exercise is sometimes called corner stretch. This exercise rotates your arm outward (external rotation) and moves your arm out from your body (abduction). 1. Stand in a doorway with one of your feet slightly in front of the other. This is called a staggered stance. If you cannot reach your forearms to the door frame, stand facing a corner of a room. 2. Choose one of the following positions as told by your health care provider: ? Place your hands and forearms on the door frame above your head. ? Place your hands and forearms on the door frame at the height of your head. ? Place your hands on the door frame at the height of your elbows. 3. Slowly move your weight onto your front foot until you feel a stretch across your chest and in the front of your shoulders. Keep your head and chest upright and keep your abdominal muscles tight. 4. Hold for __________ seconds. 5. To release the stretch, shift your weight to your back foot. Repeat __________ times. Complete this exercise __________ times a day. Extension, standing 1. Stand and hold a broomstick, a cane, or a similar object behind your back. ? Your hands should be a little wider than shoulder width apart. ? Your palms should face away from your back. 2. Keeping your elbows straight and your shoulder muscles relaxed, move the stick away  from your body until you feel a stretch in your shoulders (extension). ? Avoid shrugging your shoulders while you move the stick. Keep your shoulder blades tucked down toward the middle of your back. 3. Hold for __________ seconds. 4. Slowly return to the starting position. Repeat __________ times. Complete this exercise __________ times a day. Range-of-motion exercises Pendulum  1. Stand near a wall or a surface that you can hold onto for balance. 2. Bend at the waist and let your left / right arm hang straight down. Use your other arm to support you. Keep your back straight and do not lock your knees. 3. Relax your left / right arm and shoulder muscles, and move your hips and your trunk so your left / right arm swings freely. Your arm should swing because of the motion of your body, not because you are using your arm or shoulder muscles. 4. Keep moving your hips and trunk so your arm swings in the following directions, as told by your health care provider: ? Side to side. ? Forward and backward. ? In clockwise and counterclockwise circles. 5. Continue each motion for __________ seconds, or for as long as told by your health care provider. 6. Slowly return to the starting position. Repeat __________ times. Complete this exercise __________ times a day. Shoulder flexion, standing  1. Stand and hold a broomstick, a cane, or a similar object. Place your hands a little more than shoulder width apart on the object. Your left / right hand  should be palm up, and your other hand should be palm down. 2. Keep your elbow straight and your shoulder muscles relaxed. Push the stick up with your healthy arm to raise your left / right arm in front of your body, and then over your head until you feel a stretch in your shoulder (flexion). ? Avoid shrugging your shoulder while you raise your arm. Keep your shoulder blade tucked down toward the middle of your back. 3. Hold for __________ seconds. 4. Slowly  return to the starting position. Repeat __________ times. Complete this exercise __________ times a day. Shoulder abduction, standing 1. Stand and hold a broomstick, a cane, or a similar object. Place your hands a little more than shoulder width apart on the object. Your left / right hand should be palm up, and your other hand should be palm down. 2. Keep your elbow straight and your shoulder muscles relaxed. Push the object across your body toward your left / right side. Raise your left / right arm to the side of your body (abduction) until you feel a stretch in your shoulder. ? Do not raise your arm above shoulder height unless your health care provider tells you to do that. ? If directed, raise your arm over your head. ? Avoid shrugging your shoulder while you raise your arm. Keep your shoulder blade tucked down toward the middle of your back. 3. Hold for __________ seconds. 4. Slowly return to the starting position. Repeat __________ times. Complete this exercise __________ times a day. Internal rotation  1. Place your left / right hand behind your back, palm up. 2. Use your other hand to dangle an exercise band, a towel, or a similar object over your shoulder. Grasp the band with your left / right hand so you are holding on to both ends. 3. Gently pull up on the band until you feel a stretch in the front of your left / right shoulder. The movement of your arm toward the center of your body is called internal rotation. ? Avoid shrugging your shoulder while you raise your arm. Keep your shoulder blade tucked down toward the middle of your back. 4. Hold for __________ seconds. 5. Release the stretch by letting go of the band and lowering your hands. Repeat __________ times. Complete this exercise __________ times a day. Strengthening exercises External rotation  1. Sit in a stable chair without armrests. 2. Secure an exercise band to a stable object at elbow height on your left / right  side. 3. Place a soft object, such as a folded towel or a small pillow, between your left / right upper arm and your body to move your elbow about 4 inches (10 cm) away from your side. 4. Hold the end of the exercise band so it is tight and there is no slack. 5. Keeping your elbow pressed against the soft object, slowly move your forearm out, away from your abdomen (external rotation). Keep your body steady so only your forearm moves. 6. Hold for __________ seconds. 7. Slowly return to the starting position. Repeat __________ times. Complete this exercise __________ times a day. Shoulder abduction  1. Sit in a stable chair without armrests, or stand up. 2. Hold a __________ weight in your left / right hand, or hold an exercise band with both hands. 3. Start with your arms straight down and your left / right palm facing in, toward your body. 4. Slowly lift your left / right hand out to your side (abduction).  Do not lift your hand above shoulder height unless your health care provider tells you that this is safe. ? Keep your arms straight. ? Avoid shrugging your shoulder while you do this movement. Keep your shoulder blade tucked down toward the middle of your back. 5. Hold for __________ seconds. 6. Slowly lower your arm, and return to the starting position. Repeat __________ times. Complete this exercise __________ times a day. Shoulder extension 1. Sit in a stable chair without armrests, or stand up. 2. Secure an exercise band to a stable object in front of you so it is at shoulder height. 3. Hold one end of the exercise band in each hand. Your palms should face each other. 4. Straighten your elbows and lift your hands up to shoulder height. 5. Step back, away from the secured end of the exercise band, until the band is tight and there is no slack. 6. Squeeze your shoulder blades together as you pull your hands down to the sides of your thighs (extension). Stop when your hands are straight  down by your sides. Do not let your hands go behind your body. 7. Hold for __________ seconds. 8. Slowly return to the starting position. Repeat __________ times. Complete this exercise __________ times a day. Shoulder row 1. Sit in a stable chair without armrests, or stand up. 2. Secure an exercise band to a stable object in front of you so it is at waist height. 3. Hold one end of the exercise band in each hand. Position your palms so that your thumbs are facing the ceiling (neutral position). 4. Bend each of your elbows to a 90-degree angle (right angle) and keep your upper arms at your sides. 5. Step back until the band is tight and there is no slack. 6. Slowly pull your elbows back behind you. 7. Hold for __________ seconds. 8. Slowly return to the starting position. Repeat __________ times. Complete this exercise __________ times a day. Shoulder press-ups  1. Sit in a stable chair that has armrests. Sit upright, with your feet flat on the floor. 2. Put your hands on the armrests so your elbows are bent and your fingers are pointing forward. Your hands should be about even with the sides of your body. 3. Push down on the armrests and use your arms to lift yourself off the chair. Straighten your elbows and lift yourself up as much as you comfortably can. ? Move your shoulder blades down, and avoid letting your shoulders move up toward your ears. ? Keep your feet on the ground. As you get stronger, your feet should support less of your body weight as you lift yourself up. 4. Hold for __________ seconds. 5. Slowly lower yourself back into the chair. Repeat __________ times. Complete this exercise __________ times a day. Wall push-ups  1. Stand so you are facing a stable wall. Your feet should be about one arm-length away from the wall. 2. Lean forward and place your palms on the wall at shoulder height. 3. Keep your feet flat on the floor as you bend your elbows and lean forward toward  the wall. 4. Hold for __________ seconds. 5. Straighten your elbows to push yourself back to the starting position. Repeat __________ times. Complete this exercise __________ times a day. This information is not intended to replace advice given to you by your health care provider. Make sure you discuss any questions you have with your health care provider. Document Revised: 10/23/2018 Document Reviewed: 07/31/2018 Elsevier Patient Education  2020 [employer]  

## 2020-02-24 MED FILL — DICLOFENAC SOD EC 75 MG TAB: 75 | 30 days supply | Qty: 60 | Fill #1

## 2020-03-02 ENCOUNTER — Telehealth (INDEPENDENT_AMBULATORY_CARE_PROVIDER_SITE_OTHER): Payer: No Typology Code available for payment source | Admitting: Family Medicine

## 2020-03-02 ENCOUNTER — Other Ambulatory Visit: Payer: Self-pay

## 2020-03-02 ENCOUNTER — Telehealth: Payer: Self-pay | Admitting: Family Medicine

## 2020-03-02 ENCOUNTER — Other Ambulatory Visit: Payer: Self-pay | Admitting: Family Medicine

## 2020-03-02 DIAGNOSIS — K219 Gastro-esophageal reflux disease without esophagitis: Secondary | ICD-10-CM

## 2020-03-02 MED ORDER — PANTOPRAZOLE SODIUM 40 MG PO TBEC: 40.0000 mg | DELAYED_RELEASE_TABLET | Freq: Every day | ORAL | 3 refills | Status: DC

## 2020-03-02 NOTE — Telephone Encounter (Signed)
Error

## 2020-03-02 NOTE — Progress Notes (Signed)
° °  Subjective:    Patient ID: Janice Higgins, female    DOB: [birth year], 64 y.o.   MRN: [id number]  HPI Pt having headache for 2 days and having some drainage. Pt began to take sinus med and that is when the drainage began.  Pt was placed on Pepcid on 02/15/20 and is wondering if that could be the cause of headache.  Relates feels like there is a bad taste in the mouth some postnasal drainage but no mucoid drainage denies wheezing difficulty breathing states all this started around the time she started Pepcid. Virtual Visit via Telephone Note  I connected with Janice Higgins on 03/02/20 at  4:10 PM EDT by telephone and verified that I am speaking with the correct person using two identifiers.  Location: Patient: home Provider: office   I discussed the limitations, risks, security and privacy concerns of performing an evaluation and management service by telephone and the availability of in person appointments. I also discussed with the patient that there may be a patient responsible charge related to this service. The patient expressed understanding and agreed to proceed.   History of Present Illness:    Observations/Objective:   Assessment and Plan:   Follow Up Instructions:    I discussed the assessment and treatment plan with the patient. The patient was provided an opportunity to ask questions and all were answered. The patient agreed with the plan and demonstrated an understanding of the instructions.   The patient was advised to call back or seek an in-person evaluation if the symptoms worsen or if the condition fails to improve as anticipated.  I provided 20 minutes of non-face-to-face time during this encounter.  Including documentation       Review of Systems Denies fevers chills body aches wheezing difficulty breathing    Objective:   Physical Exam  Today's visit was via telephone Physical exam was not possible for this visit       Assessment & Plan:  Bad taste in  mouth intermittent headache hard to discern if this is underlying virus or possible sinus infection or if it could be related to reflux and side effects of medicine Stop Pepcid Start pantoprazole If not dramatically better by early next week I recommend in person visit

## 2020-03-02 NOTE — Telephone Encounter (Signed)
[name], conkle are scheduled for a virtual visit with your provider today.    Just as we do with appointments in the office, we must obtain your consent to participate.  Your consent will be active for this visit and any virtual visit you may have with one of our providers in the next 365 days.    If you have a MyChart account, I can also send a copy of this consent to you electronically.  All virtual visits are billed to your insurance company just like a traditional visit in the office.  As this is a virtual visit, video technology does not allow for your provider to perform a traditional examination.  This may limit your provider's ability to fully assess your condition.  If your provider identifies any concerns that need to be evaluated in person or the need to arrange testing such as labs, EKG, etc, we will make arrangements to do so.    Although advances in technology are sophisticated, we cannot ensure that it will always work on either your end or our end.  If the connection with a video visit is poor, we may have to switch to a telephone visit.  With either a video or telephone visit, we are not always able to ensure that we have a secure connection.   I need to obtain your verbal consent now.   Are you willing to proceed with your visit today?   Janice Higgins has provided verbal consent on 03/02/2020 for a virtual visit (video or telephone).   Vicente Males, LPN [id number]  8:31 PM

## 2020-03-15 ENCOUNTER — Encounter (HOSPITAL_COMMUNITY)
Admission: RE | Admit: 2020-03-15 | Discharge: 2020-03-15 | Disposition: A | Discharge status: Home / Self Care | Payer: No Typology Code available for payment source | Attending: Family Medicine | Admitting: Family Medicine

## 2020-03-15 ENCOUNTER — Other Ambulatory Visit: Payer: Self-pay

## 2020-03-15 DIAGNOSIS — M810 Age-related osteoporosis without current pathological fracture: Secondary | ICD-10-CM | POA: Diagnosis present

## 2020-03-15 LAB — RENAL FUNCTION PANEL
Albumin: 4.2 g/dL (ref 3.5–5.0)
Anion gap: 10 (ref 5–15)
BUN: 17 mg/dL (ref 8–23)
CO2: 25 mmol/L (ref 22–32)
Calcium: 9.7 mg/dL (ref 8.9–10.3)
Chloride: 104 mmol/L (ref 98–111)
Creatinine, Ser: 0.67 mg/dL (ref 0.44–1.00)
GFR calc Af Amer: >60 mL/min (ref >60)
GFR calc non Af Amer: >60 mL/min (ref >60)
Glucose, Bld: 99 mg/dL (ref 70–99)
Phosphorus: 3.6 mg/dL (ref 2.5–4.6)
Potassium: 4.1 mmol/L (ref 3.5–5.1)
Sodium: 139 mmol/L (ref 135–145)

## 2020-03-15 LAB — POCT I-STAT, CHEM 8
BUN: 16 mg/dL (ref 8–23)
Calcium, Ion: 1.18 mmol/L (ref 1.15–1.40)
Chloride: 104 mmol/L (ref 98–111)
Creatinine, Ser: 0.7 mg/dL (ref 0.44–1.00)
Glucose, Bld: 98 mg/dL (ref 70–99)
HCT: 41 % (ref 36.0–46.0)
Hemoglobin: 13.9 g/dL (ref 12.0–15.0)
Potassium: 4 mmol/L (ref 3.5–5.1)
Sodium: 143 mmol/L (ref 135–145)
TCO2: 24 mmol/L (ref 22–32)

## 2020-03-15 MED ORDER — IBANDRONATE SODIUM 3 MG/3ML IV SOLN
3.0000 mg | Freq: Once | INTRAVENOUS | Status: AC
  Administered 2020-03-15: 3 mg via INTRAVENOUS

## 2020-03-28 ENCOUNTER — Other Ambulatory Visit: Payer: Self-pay

## 2020-03-28 ENCOUNTER — Ambulatory Visit (INDEPENDENT_AMBULATORY_CARE_PROVIDER_SITE_OTHER): Payer: No Typology Code available for payment source | Admitting: Family Medicine

## 2020-03-28 ENCOUNTER — Encounter: Payer: Self-pay | Admitting: Family Medicine

## 2020-03-28 VITALS — BP 146/88 | HR 94 | Temp 97.8°F | Wt 180.8 lb

## 2020-03-28 DIAGNOSIS — M25562 Pain in left knee: Secondary | ICD-10-CM

## 2020-03-28 NOTE — Progress Notes (Signed)
Patient ID: Janice Higgins, female    DOB: [birth year], 64 y.o.   MRN: [id number]   Chief Complaint  Patient presents with  . Knee Pain    Patient reports left knee pain x 1 month. Believes this is arthirtis like she has had int he past in her right knee.    Subjective:  CC: left knee pain.   HPI Here today for her left knee pain that started one month ago, with pain, swelling, popping. No catching.   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   Outpatient Encounter Medications as of 03/28/2020  Medication Sig  . acetaminophen (TYLENOL) 325 MG tablet Take 650 mg by mouth every 6 (six) hours as needed.  Marland Kitchen albuterol (PROVENTIL HFA;VENTOLIN HFA) 108 (90 Base) MCG/ACT inhaler Inhale 2 puffs into the lungs every 4 (four) hours as needed.  . diclofenac (VOLTAREN) 75 MG EC tablet TAKE 1 TABLET BY MOUTH TWICE A DAY WITH MEALS  . Doxylamine Succinate, Sleep, (SLEEP AID PO) Take 1 tablet by mouth at bedtime as needed.  Marland Kitchen HYDROcodone-acetaminophen (NORCO/VICODIN) 5-325 MG tablet Take one tablet po qhs prn pain  . ibandronate (BONIVA) 3 MG/3ML SOLN injection Inject 3 mg into the vein every 3 (three) months.  . pantoprazole (PROTONIX) 40 MG tablet Take 1 tablet (40 mg total) by mouth daily.  . [DISCONTINUED] famotidine (PEPCID) 20 MG tablet Take 1 tablet (20 mg total) by mouth 2 (two) times daily. Prn acid reflux   No facility-administered encounter medications on file as of 03/28/2020.     Review of Systems  Constitutional: Negative for chills and fever.  HENT: Negative.   Respiratory: Negative.   Cardiovascular: Negative.   Musculoskeletal: Positive for joint swelling.       Left knee pain and  swelling x 1 month. Denies injury. Does kneel on knees occasionally.     Vitals BP (!) 146/88   Pulse 94   Temp 97.8 F (36.6 C)   Wt 180 lb 12.8 oz (82 kg)   SpO2 96%   BMI 33.07 kg/m   Objective:   Physical Exam Constitutional:      Appearance: Normal appearance.  Cardiovascular:     Rate and Rhythm: Normal rate and regular rhythm.     Heart sounds: Normal heart sounds.  Pulmonary:     Effort: Pulmonary effort is normal.     Breath sounds: Normal breath sounds.  Musculoskeletal:        General: Swelling and tenderness present.     Comments: Left knee with swelling and popping. Denies locking. Pain is affecting her activity level. Tenderness along joint line.   Skin:    General: Skin is warm and dry.  Neurological:     Mental Status: She is alert and oriented to person, place, and time.  Psychiatric:        Mood and Affect: Mood normal.        Behavior: Behavior normal.      Assessment and Plan   1. Left knee pain, unspecified chronicity - DG Knee Complete 4 Views Left - AMB referral to orthopedics   Left knee pain and swelling for one month. No known injury. Does kneel on her knees. Knee joint is not warm to touch or erythematous. Will  get x-ray to rule out frature and refer to ortho for further evaluation and possible joint injection. Has been in the care of Dr. Sharol Given in [place] for the right knee in the past. Will start conservative treatment with RICE while she awaits ortho appointment.   Agrees with plan of care discussed today. Understands warning signs to seek further care: joint warmth, fever, inability to bear weight. Understands to follow-up in 2 weeks with blood pressure readings due to elevated blood pressure in office today. Recheck still elevated: 140/82. She will take pressures at home and send this information to [place], it is possible she may need to go on medication for hypertension.  Janice Ades, FNP-C

## 2020-03-28 NOTE — Patient Instructions (Addendum)
Use Tylenol OTC as instructed May use Voltarin gel if needed, not if take Voltarin tablets on same day.  We will referral to Dr. Sharol Given Knee x-ray. Take blood pressures at home for 2 weeks and let us know what the results are-- you may need to go on medicine.     RICE Therapy for Routine Care of Injuries Many injuries can be cared for with rest, ice, compression, and elevation (RICE therapy). This includes:  Resting the injured part.  Putting ice on the injury.  Putting pressure (compression) on the injury.  Raising the injured part (elevation). Using RICE therapy can help to lessen pain and swelling. Supplies needed:  Ice.  Plastic bag.  Towel.  Elastic bandage.  Pillow or pillows to raise (elevate) your injured body part. How to care for your injury with RICE therapy Rest Limit your normal activities, and try not to use the injured part of your body. You can go back to your normal activities when your doctor says it is okay to do them and you feel okay. Ask your doctor if you should do exercises to help your injury get better. Ice Put ice on the injured area. Do not put ice on your bare skin.  Put ice in a plastic bag.  Place a towel between your skin and the bag.  Leave the ice on for 20 minutes, 2-3 times a day. Use ice on as many days as told by your doctor.  Compression Compression means putting pressure on the injured area. This can be done with an elastic bandage. If an elastic bandage has been put on your injury:  Do not wrap the bandage too tight. Wrap the bandage more loosely if part of your body away from the bandage is blue, swollen, cold, painful, or loses feeling (gets numb).  Take off the bandage and put it on again. Do this every 3-4 hours or as told by your doctor.  See your doctor if the bandage seems to make your problems worse.  Elevation Elevation means keeping the injured area raised. If you can, raise the injured area above your heart or the  center of your chest. Contact a doctor if:  You keep having pain and swelling.  Your symptoms get worse. Get help right away if:  You have sudden bad pain at your injury or lower than your injury.  You have redness or more swelling around your injury.  You have tingling or numbness at your injury or lower than your injury, and it does not go away when you take off the bandage. Summary  Many injuries can be cared for using rest, ice, compression, and elevation (RICE therapy).  You can go back to your normal activities when you feel okay and your doctor says it is okay.  Put ice on the injured area as told by your doctor.  Get help if your symptoms get worse or if you keep having pain and swelling. This information is not intended to replace advice given to you by your health care provider. Make sure you discuss any questions you have with your health care provider. Document Revised: 03/21/2017 Document Reviewed: 03/21/2017 Elsevier Patient Education  [place].

## 2020-03-31 ENCOUNTER — Ambulatory Visit (HOSPITAL_COMMUNITY)
Admission: RE | Admit: 2020-03-31 | Discharge: 2020-03-31 | Disposition: A | Discharge status: Home / Self Care | Payer: No Typology Code available for payment source | Source: Ambulatory Visit | Attending: Family Medicine | Admitting: Family Medicine

## 2020-03-31 ENCOUNTER — Other Ambulatory Visit: Payer: Self-pay

## 2020-03-31 DIAGNOSIS — M25562 Pain in left knee: Secondary | ICD-10-CM | POA: Insufficient documentation

## 2020-04-03 MED FILL — PANTOPRAZOLE SOD DR 40 MG T: 40 | 30 days supply | Qty: 30 | Fill #0

## 2020-04-06 ENCOUNTER — Encounter: Payer: Self-pay | Admitting: Family Medicine

## 2020-04-18 ENCOUNTER — Encounter: Payer: Self-pay | Admitting: Family Medicine

## 2020-04-20 ENCOUNTER — Encounter: Payer: Self-pay | Admitting: Family Medicine

## 2020-04-20 ENCOUNTER — Other Ambulatory Visit: Payer: Self-pay

## 2020-04-20 ENCOUNTER — Other Ambulatory Visit: Payer: Self-pay | Admitting: Family Medicine

## 2020-04-20 ENCOUNTER — Ambulatory Visit (INDEPENDENT_AMBULATORY_CARE_PROVIDER_SITE_OTHER): Payer: No Typology Code available for payment source | Admitting: Family Medicine

## 2020-04-20 VITALS — BP 148/88 | HR 105 | Temp 97.6°F | Ht 62.0 in | Wt 182.0 lb

## 2020-04-20 DIAGNOSIS — I10 Essential (primary) hypertension: Secondary | ICD-10-CM

## 2020-04-20 MED ORDER — LISINOPRIL 5 MG PO TABS: ORAL_TABLET | ORAL | 1 refills | Status: DC

## 2020-04-20 NOTE — Progress Notes (Signed)
Patient ID: Janice Higgins, female    DOB: [birth year], 64 y.o.   MRN: [id number]   Chief Complaint  Patient presents with  . Hypertension   Subjective:  CC; concerned about blood pressure  HPI  follow up on elevated bp readings. Has been monitoring bp twice per day since 9/15. BP ranges from 150-170/104-110 on home cuff. She did a great job of documenting this for [place] today. She is starting to have headaches as well. No night time awakenings and not as bad as the h/a she got with Covid.   Hard spot on bottom of left foot.    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   Outpatient Encounter Medications as of 04/20/2020  Medication Sig  . acetaminophen (TYLENOL) 325 MG tablet Take 650 mg by mouth every 6 (six) hours as needed.  Marland Kitchen albuterol (PROVENTIL HFA;VENTOLIN HFA) 108 (90 Base) MCG/ACT inhaler Inhale 2 puffs into the lungs every 4 (four) hours as needed.  . diclofenac (VOLTAREN) 75 MG EC tablet TAKE 1 TABLET BY MOUTH TWICE A DAY WITH MEALS  . Doxylamine Succinate, Sleep, (SLEEP AID PO) Take 1 tablet by mouth at bedtime as needed.  Marland Kitchen HYDROcodone-acetaminophen (NORCO/VICODIN) 5-325 MG tablet Take one tablet po qhs prn pain  . ibandronate (BONIVA) 3 MG/3ML SOLN injection Inject 3 mg into the vein every 3 (three) months.  . pantoprazole (PROTONIX) 40 MG tablet Take 1 tablet (40 mg total) by mouth daily.  Marland Kitchen lisinopril (ZESTRIL) 5 MG tablet Take 1/2 tablet for 7 days, then one tablet.   No facility-administered encounter medications on file as of 04/20/2020.     Review of Systems  Constitutional: Negative for fever.  Respiratory: Positive for shortness of breath. Negative for chest  tightness.        Since Covid.   Cardiovascular: Negative for chest pain.  Musculoskeletal:       Seeing ortho for knees soon.  Neurological: Positive for headaches.       No nighttime awakenings, not worse than headache with Covid infection.      Vitals BP (!) 148/88   Pulse (!) 105   Temp 97.6 F (36.4 C)   Ht 5\' 2"  (1.575 m)   Wt 182 lb (82.6 kg)   BMI 33.29 kg/m   Objective:   Physical Exam Vitals and nursing note reviewed.  Constitutional:      General: She is not in acute distress.    Appearance: She is not ill-appearing.  Cardiovascular:     Rate and Rhythm: Regular rhythm. Tachycardia present.     Heart sounds: Normal heart sounds.  Pulmonary:     Effort: Pulmonary effort is normal.     Breath sounds: Normal breath sounds.  Skin:    General: Skin is warm and dry.  Neurological:     Mental Status: She is oriented to person, place, and time. Mental status is at baseline.  Psychiatric:        Mood and Affect: Mood normal.        Behavior: Behavior normal.        Thought Content: Thought content normal.        Judgment: Judgment normal.      Assessment and Plan   1. Essential hypertension - lisinopril (  ZESTRIL) 5 MG tablet; Take 1/2 tablet for 7 days, then one tablet.  Dispense: 30 tablet; Refill: 1 - Comprehensive Metabolic Panel (CMET)   Start Lisinopril 1/2 tablet for 7 days. Continue to take your blood pressure and send readings through My Chart in one week. Then we will decide if you should increase to one tablet after I see blood pressure readings.  Follow-up in 1 week to see how its going and bring in your cuff from home to see if it is accurate.  Get lab drawn before that visit.   Will use moisturizer on foot and will assess at next visit.   Agrees with plan of care discussed today. Understands warning signs to seek further care: blood pressure does not respond and keeps getting higher. Headache that is the worse headache you've ever  had. Understands to follow-up in one week and bring blood pressure cuff to office so we can compare home readings to our office readings.    Pecolia Ades, FNP-C 04/20/2020

## 2020-04-20 NOTE — Patient Instructions (Addendum)
Start Lisinopril 1/2 tablet for 7 days. Continue to take your blood pressure and send readings through My Chart in one week. Then we will decide if you should increase to one tablet.  Follow-up in 2 weeks to see how its going and bring in your cuff from home to see if it is accurate.  Get lab drawn before that visit.    DASH Eating Plan DASH stands for "Dietary Approaches to Stop Hypertension." The DASH eating plan is a healthy eating plan that has been shown to reduce high blood pressure (hypertension). It may also reduce your risk for type 2 diabetes, heart disease, and stroke. The DASH eating plan may also help with weight loss. What are tips for following this plan?  General guidelines  Avoid eating more than 2,300 mg (milligrams) of salt (sodium) a day. If you have hypertension, you may need to reduce your sodium intake to 1,500 mg a day.  Limit alcohol intake to no more than 1 drink a day for nonpregnant women and 2 drinks a day for men. One drink equals 12 oz of beer, 5 oz of wine, or 1 oz of hard liquor.  Work with your health care provider to maintain a healthy body weight or to lose weight. Ask what an ideal weight is for you.  Get at least 30 minutes of exercise that causes your heart to beat faster (aerobic exercise) most days of the week. Activities may include walking, swimming, or biking.  Work with your health care provider or diet and nutrition specialist (dietitian) to adjust your eating plan to your individual calorie needs. Reading food labels   Check food labels for the amount of sodium per serving. Choose foods with less than 5 percent of the Daily Value of sodium. Generally, foods with less than 300 mg of sodium per serving fit into this eating plan.  To find whole grains, look for the word "whole" as the first word in the ingredient list. Shopping  Buy products labeled as "low-sodium" or "no salt added."  Buy fresh foods. Avoid canned foods and premade or frozen  meals. Cooking  Avoid adding salt when cooking. Use salt-free seasonings or herbs instead of table salt or sea salt. Check with your health care provider or pharmacist before using salt substitutes.  Do not fry foods. Cook foods using healthy methods such as baking, boiling, grilling, and broiling instead.  Cook with heart-healthy oils, such as olive, canola, soybean, or sunflower oil. Meal planning  Eat a balanced diet that includes: ? 5 or more servings of fruits and vegetables each day. At each meal, try to fill half of your plate with fruits and vegetables. ? Up to 6-8 servings of whole grains each day. ? Less than 6 oz of lean meat, poultry, or fish each day. A 3-oz serving of meat is about the same size as a deck of cards. One egg equals 1 oz. ? 2 servings of low-fat dairy each day. ? A serving of nuts, seeds, or beans 5 times each week. ? Heart-healthy fats. Healthy fats called Omega-3 fatty acids are found in foods such as flaxseeds and coldwater fish, like sardines, salmon, and mackerel.  Limit how much you eat of the following: ? Canned or prepackaged foods. ? Food that is high in trans fat, such as fried foods. ? Food that is high in saturated fat, such as fatty meat. ? Sweets, desserts, sugary drinks, and other foods with added sugar. ? Full-fat dairy products.  Do  not salt foods before eating.  Try to eat at least 2 vegetarian meals each week.  Eat more home-cooked food and less restaurant, buffet, and fast food.  When eating at a restaurant, ask that your food be prepared with less salt or no salt, if possible. What foods are recommended? The items listed may not be a complete list. Talk with your dietitian about what dietary choices are best for you. Grains Whole-grain or whole-wheat bread. Whole-grain or whole-wheat pasta. Brown rice. [place]. Bulgur. Whole-grain and low-sodium cereals. Pita bread. Low-fat, low-sodium crackers. Whole-wheat flour  tortillas. Vegetables Fresh or frozen vegetables (raw, steamed, roasted, or grilled). Low-sodium or reduced-sodium tomato and vegetable juice. Low-sodium or reduced-sodium tomato sauce and tomato paste. Low-sodium or reduced-sodium canned vegetables. Fruits All fresh, dried, or frozen fruit. Canned fruit in natural juice (without added sugar). Meat and other protein foods Skinless chicken or [place]. Ground chicken or [place]. Pork with fat trimmed off. Fish and seafood. Egg whites. Dried beans, peas, or lentils. Unsalted nuts, nut butters, and seeds. Unsalted canned beans. Lean cuts of beef with fat trimmed off. Low-sodium, lean deli meat. Dairy Low-fat (1%) or fat-free (skim) milk. Fat-free, low-fat, or reduced-fat cheeses. Nonfat, low-sodium ricotta or cottage cheese. Low-fat or nonfat yogurt. Low-fat, low-sodium cheese. Fats and oils Soft margarine without trans fats. Vegetable oil. Low-fat, reduced-fat, or light mayonnaise and salad dressings (reduced-sodium). Canola, safflower, olive, soybean, and sunflower oils. Avocado. Seasoning and other foods Herbs. Spices. Seasoning mixes without salt. Unsalted popcorn and pretzels. Fat-free sweets. What foods are not recommended? The items listed may not be a complete list. Talk with your dietitian about what dietary choices are best for you. Grains Baked goods made with fat, such as croissants, muffins, or some breads. Dry pasta or rice meal packs. Vegetables Creamed or fried vegetables. Vegetables in a cheese sauce. Regular canned vegetables (not low-sodium or reduced-sodium). Regular canned tomato sauce and paste (not low-sodium or reduced-sodium). Regular tomato and vegetable juice (not low-sodium or reduced-sodium). Angie Fava. Olives. Fruits Canned fruit in a light or heavy syrup. Fried fruit. Fruit in cream or butter sauce. Meat and other protein foods Fatty cuts of meat. Ribs. Fried meat. Berniece Salines. Sausage. Bologna and other processed lunch meats.  Salami. Fatback. Hotdogs. Bratwurst. Salted nuts and seeds. Canned beans with added salt. Canned or smoked fish. Whole eggs or egg yolks. Chicken or [place] with skin. Dairy Whole or 2% milk, cream, and half-and-half. Whole or full-fat cream cheese. Whole-fat or sweetened yogurt. Full-fat cheese. Nondairy creamers. Whipped toppings. Processed cheese and cheese spreads. Fats and oils Butter. Stick margarine. Lard. Shortening. Ghee. Bacon fat. Tropical oils, such as coconut, palm kernel, or palm oil. Seasoning and other foods Salted popcorn and pretzels. Onion salt, garlic salt, seasoned salt, table salt, and sea salt. Worcestershire sauce. Tartar sauce. Barbecue sauce. Teriyaki sauce. Soy sauce, including reduced-sodium. Steak sauce. Canned and packaged gravies. Fish sauce. Oyster sauce. Cocktail sauce. Horseradish that you find on the shelf. Ketchup. Mustard. Meat flavorings and tenderizers. Bouillon cubes. Hot sauce and Tabasco sauce. Premade or packaged marinades. Premade or packaged taco seasonings. Relishes. Regular salad dressings. Where to find more information:  National Heart, Lung, and [place]: https://wilson-eaton.com/  American Heart Association: www.heart.org Summary  The DASH eating plan is a healthy eating plan that has been shown to reduce high blood pressure (hypertension). It may also reduce your risk for type 2 diabetes, heart disease, and stroke.  With the DASH eating plan, you should limit salt (sodium) intake  to 2,300 mg a day. If you have hypertension, you may need to reduce your sodium intake to 1,500 mg a day.  When on the DASH eating plan, aim to eat more fresh fruits and vegetables, whole grains, lean proteins, low-fat dairy, and heart-healthy fats.  Work with your health care provider or diet and nutrition specialist (dietitian) to adjust your eating plan to your individual calorie needs. This information is not intended to replace advice given to you by your health  care provider. Make sure you discuss any questions you have with your health care provider. Document Revised: 06/13/2017 Document Reviewed: 06/24/2016 Elsevier Patient Education  2020 [employer].  Hypertension, Adult Hypertension is another name for high blood pressure. High blood pressure forces your heart to work harder to pump blood. This can cause problems over time. There are two numbers in a blood pressure reading. There is a top number (systolic) over a bottom number (diastolic). It is best to have a blood pressure that is below 120/80. Healthy choices can help lower your blood pressure, or you may need medicine to help lower it. What are the causes? The cause of this condition is not known. Some conditions may be related to high blood pressure. What increases the risk?  Smoking.  Having type 2 diabetes mellitus, high cholesterol, or both.  Not getting enough exercise or physical activity.  Being overweight.  Having too much fat, sugar, calories, or salt (sodium) in your diet.  Drinking too much alcohol.  Having long-term (chronic) kidney disease.  Having a family history of high blood pressure.  Age. Risk increases with age.  Race. You may be at higher risk if you are African American.  Gender. Men are at higher risk than women before age 52. After age 54, women are at higher risk than men.  Having obstructive sleep apnea.  Stress. What are the signs or symptoms?  High blood pressure may not cause symptoms. Very high blood pressure (hypertensive crisis) may cause: ? Headache. ? Feelings of worry or nervousness (anxiety). ? Shortness of breath. ? Nosebleed. ? A feeling of being sick to your stomach (nausea). ? Throwing up (vomiting). ? Changes in how you see. ? Very bad chest pain. ? Seizures. How is this treated?  This condition is treated by making healthy lifestyle changes, such as: ? Eating healthy foods. ? Exercising more. ? Drinking less  alcohol.  Your health care provider may prescribe medicine if lifestyle changes are not enough to get your blood pressure under control, and if: ? Your top number is above 130. ? Your bottom number is above 80.  Your personal target blood pressure may vary. Follow these instructions at home: Eating and drinking   If told, follow the DASH eating plan. To follow this plan: ? Fill one half of your plate at each meal with fruits and vegetables. ? Fill one fourth of your plate at each meal with whole grains. Whole grains include whole-wheat pasta, brown rice, and whole-grain bread. ? Eat or drink low-fat dairy products, such as skim milk or low-fat yogurt. ? Fill one fourth of your plate at each meal with low-fat (lean) proteins. Low-fat proteins include fish, chicken without skin, eggs, beans, and tofu. ? Avoid fatty meat, cured and processed meat, or chicken with skin. ? Avoid pre-made or processed food.  Eat less than 1,500 mg of salt each day.  Do not drink alcohol if: ? Your doctor tells you not to drink. ? You are pregnant, may  be pregnant, or are planning to become pregnant.  If you drink alcohol: ? Limit how much you use to:  0-1 drink a day for women.  0-2 drinks a day for men. ? Be aware of how much alcohol is in your drink. In the [place] one drink equals one 12 oz bottle of beer (355 mL), one 5 oz glass of wine (148 mL), or one 1 oz glass of hard liquor (44 mL). Lifestyle   Work with your doctor to stay at a healthy weight or to lose weight. Ask your doctor what the best weight is for you.  Get at least 30 minutes of exercise most days of the week. This may include walking, swimming, or biking.  Get at least 30 minutes of exercise that strengthens your muscles (resistance exercise) at least 3 days a week. This may include lifting weights or doing Pilates.  Do not use any products that contain nicotine or tobacco, such as cigarettes, e-cigarettes, and chewing tobacco. If  you need help quitting, ask your doctor.  Check your blood pressure at home as told by your doctor.  Keep all follow-up visits as told by your doctor. This is important. Medicines  Take over-the-counter and prescription medicines only as told by your doctor. Follow directions carefully.  Do not skip doses of blood pressure medicine. The medicine does not work as well if you skip doses. Skipping doses also puts you at risk for problems.  Ask your doctor about side effects or reactions to medicines that you should watch for. Contact a doctor if you:  Think you are having a reaction to the medicine you are taking.  Have headaches that keep coming back (recurring).  Feel dizzy.  Have swelling in your ankles.  Have trouble with your vision. Get help right away if you:  Get a very bad headache.  Start to feel mixed up (confused).  Feel weak or numb.  Feel faint.  Have very bad pain in your: ? Chest. ? Belly (abdomen).  Throw up more than once.  Have trouble breathing. Summary  Hypertension is another name for high blood pressure.  High blood pressure forces your heart to work harder to pump blood.  For most people, a normal blood pressure is less than 120/80.  Making healthy choices can help lower blood pressure. If your blood pressure does not get lower with healthy choices, you may need to take medicine. This information is not intended to replace advice given to you by your health care provider. Make sure you discuss any questions you have with your health care provider. Document Revised: 03/11/2018 Document Reviewed: 03/11/2018 Elsevier Patient Education  2020 [employer].

## 2020-04-26 MED FILL — DICLOFENAC SOD EC 75 MG TAB: 75 | 30 days supply | Qty: 60 | Fill #2

## 2020-04-27 ENCOUNTER — Encounter: Payer: Self-pay | Admitting: Family Medicine

## 2020-04-27 ENCOUNTER — Ambulatory Visit: Payer: No Typology Code available for payment source | Admitting: Orthopedic Surgery

## 2020-04-27 MED FILL — PANTOPRAZOLE SOD DR 40 MG T: 40 | 30 days supply | Qty: 30 | Fill #1

## 2020-05-02 LAB — COMPREHENSIVE METABOLIC PANEL
ALT: 27 IU/L (ref 0–32)
AST: 19 IU/L (ref 0–40)
Albumin/Globulin Ratio: 1.6 (ref 1.2–2.2)
Albumin: 4.6 g/dL (ref 3.8–4.8)
Alkaline Phosphatase: 52 IU/L (ref 44–121)
BUN/Creatinine Ratio: 19 (ref 12–28)
BUN: 17 mg/dL (ref 8–27)
Bilirubin Total: <0.2 mg/dL (ref 0.0–1.2)
CO2: 24 mmol/L (ref 20–29)
Calcium: 9.5 mg/dL (ref 8.7–10.3)
Chloride: 106 mmol/L (ref 96–106)
Creatinine, Ser: 0.91 mg/dL (ref 0.57–1.00)
GFR calc Af Amer: 78 mL/min/{1.73_m2} (ref >59)
GFR calc non Af Amer: 67 mL/min/{1.73_m2} (ref >59)
Globulin, Total: 2.8 g/dL (ref 1.5–4.5)
Glucose: 91 mg/dL (ref 65–99)
Potassium: 4.3 mmol/L (ref 3.5–5.2)
Sodium: 142 mmol/L (ref 134–144)
Total Protein: 7.4 g/dL (ref 6.0–8.5)

## 2020-05-03 ENCOUNTER — Ambulatory Visit: Payer: No Typology Code available for payment source | Admitting: Family Medicine

## 2020-05-04 ENCOUNTER — Other Ambulatory Visit: Payer: Self-pay

## 2020-05-04 ENCOUNTER — Other Ambulatory Visit (HOSPITAL_COMMUNITY): Payer: Self-pay

## 2020-05-04 ENCOUNTER — Ambulatory Visit (INDEPENDENT_AMBULATORY_CARE_PROVIDER_SITE_OTHER): Payer: No Typology Code available for payment source | Admitting: Family Medicine

## 2020-05-04 ENCOUNTER — Encounter: Payer: Self-pay | Admitting: Family Medicine

## 2020-05-04 DIAGNOSIS — I10 Essential (primary) hypertension: Secondary | ICD-10-CM | POA: Diagnosis not present

## 2020-05-04 MED ORDER — LISINOPRIL 5 MG PO TABS: ORAL_TABLET | ORAL | 2 refills | Status: DC

## 2020-05-04 MED FILL — LISINOPRIL 5 MG TABS: 5 | 30 days supply | Qty: 30 | Fill #0

## 2020-05-04 MED FILL — OMRON 3 SERIES BP MONITOR D: 1 days supply | Qty: 1 | Fill #0

## 2020-05-04 NOTE — Patient Instructions (Signed)
Keep monitoring blood pressure 2-3 times per week. Let me know if it starts to increase again.    Hypertension, Adult Hypertension is another name for high blood pressure. High blood pressure forces your heart to work harder to pump blood. This can cause problems over time. There are two numbers in a blood pressure reading. There is a top number (systolic) over a bottom number (diastolic). It is best to have a blood pressure that is below 120/80. Healthy choices can help lower your blood pressure, or you may need medicine to help lower it. What are the causes? The cause of this condition is not known. Some conditions may be related to high blood pressure. What increases the risk?  Smoking.  Having type 2 diabetes mellitus, high cholesterol, or both.  Not getting enough exercise or physical activity.  Being overweight.  Having too much fat, sugar, calories, or salt (sodium) in your diet.  Drinking too much alcohol.  Having long-term (chronic) kidney disease.  Having a family history of high blood pressure.  Age. Risk increases with age.  Race. You may be at higher risk if you are African American.  Gender. Men are at higher risk than women before age 3. After age 1, women are at higher risk than men.  Having obstructive sleep apnea.  Stress. What are the signs or symptoms?  High blood pressure may not cause symptoms. Very high blood pressure (hypertensive crisis) may cause: ? Headache. ? Feelings of worry or nervousness (anxiety). ? Shortness of breath. ? Nosebleed. ? A feeling of being sick to your stomach (nausea). ? Throwing up (vomiting). ? Changes in how you see. ? Very bad chest pain. ? Seizures. How is this treated?  This condition is treated by making healthy lifestyle changes, such as: ? Eating healthy foods. ? Exercising more. ? Drinking less alcohol.  Your health care provider may prescribe medicine if lifestyle changes are not enough to get your  blood pressure under control, and if: ? Your top number is above 130. ? Your bottom number is above 80.  Your personal target blood pressure may vary. Follow these instructions at home: Eating and drinking   If told, follow the DASH eating plan. To follow this plan: ? Fill one half of your plate at each meal with fruits and vegetables. ? Fill one fourth of your plate at each meal with whole grains. Whole grains include whole-wheat pasta, brown rice, and whole-grain bread. ? Eat or drink low-fat dairy products, such as skim milk or low-fat yogurt. ? Fill one fourth of your plate at each meal with low-fat (lean) proteins. Low-fat proteins include fish, chicken without skin, eggs, beans, and tofu. ? Avoid fatty meat, cured and processed meat, or chicken with skin. ? Avoid pre-made or processed food.  Eat less than 1,500 mg of salt each day.  Do not drink alcohol if: ? Your doctor tells you not to drink. ? You are pregnant, may be pregnant, or are planning to become pregnant.  If you drink alcohol: ? Limit how much you use to:  0-1 drink a day for women.  0-2 drinks a day for men. ? Be aware of how much alcohol is in your drink. In the [place] one drink equals one 12 oz bottle of beer (355 mL), one 5 oz glass of wine (148 mL), or one 1 oz glass of hard liquor (44 mL). Lifestyle   Work with your doctor to stay at a healthy weight or to lose  weight. Ask your doctor what the best weight is for you.  Get at least 30 minutes of exercise most days of the week. This may include walking, swimming, or biking.  Get at least 30 minutes of exercise that strengthens your muscles (resistance exercise) at least 3 days a week. This may include lifting weights or doing Pilates.  Do not use any products that contain nicotine or tobacco, such as cigarettes, e-cigarettes, and chewing tobacco. If you need help quitting, ask your doctor.  Check your blood pressure at home as told by your  doctor.  Keep all follow-up visits as told by your doctor. This is important. Medicines  Take over-the-counter and prescription medicines only as told by your doctor. Follow directions carefully.  Do not skip doses of blood pressure medicine. The medicine does not work as well if you skip doses. Skipping doses also puts you at risk for problems.  Ask your doctor about side effects or reactions to medicines that you should watch for. Contact a doctor if you:  Think you are having a reaction to the medicine you are taking.  Have headaches that keep coming back (recurring).  Feel dizzy.  Have swelling in your ankles.  Have trouble with your vision. Get help right away if you:  Get a very bad headache.  Start to feel mixed up (confused).  Feel weak or numb.  Feel faint.  Have very bad pain in your: ? Chest. ? Belly (abdomen).  Throw up more than once.  Have trouble breathing. Summary  Hypertension is another name for high blood pressure.  High blood pressure forces your heart to work harder to pump blood.  For most people, a normal blood pressure is less than 120/80.  Making healthy choices can help lower blood pressure. If your blood pressure does not get lower with healthy choices, you may need to take medicine. This information is not intended to replace advice given to you by your health care provider. Make sure you discuss any questions you have with your health care provider. Document Revised: 03/11/2018 Document Reviewed: 03/11/2018 Elsevier Patient Education  2020 [employer].

## 2020-05-04 NOTE — Progress Notes (Signed)
Patient ID: Janice Higgins, female    DOB: September 18, 1955, 64 y.o.   MRN: [id number]   Chief Complaint  Patient presents with  . recheck BP    Patient comes in today for a blood pressure check. Patient reports feeling a lot better. Patient brough her cuff from home. BP on home cuff 149/96. Bp office 138/90.   Subjective:  CC: follow-up for blood pressure   Janice Higgins comes in today for follow-up for her newly diagnosed hypertension.  She also brings her blood pressure cuff in from home.  Her blood blood pressure log is showing blood pressures from 127-137/80-88.  This is much improved from before.  She is also making some nice lifestyle changes, watching her sodium in her diet, has lost 4 pounds since last visit 3 weeks ago.  I congratulated her on the success.  She is tolerating the lisinopril no adverse effects reported today she reports that she feels good.  Pertinent negatives include no headaches, no vision changes, no chest pain, no shortness of breath.    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   Outpatient Encounter Medications as of 05/04/2020  Medication Sig  . acetaminophen (TYLENOL) 325 MG tablet Take 650 mg by mouth every 6 (six) hours as needed.  Marland Kitchen albuterol (PROVENTIL HFA;VENTOLIN HFA) 108 (90 Base) MCG/ACT inhaler Inhale 2 puffs into the lungs every 4 (four) hours as needed.  . diclofenac (VOLTAREN) 75 MG EC tablet TAKE 1 TABLET BY MOUTH TWICE A DAY WITH MEALS  . Doxylamine Succinate, Sleep, (SLEEP AID PO) Take 1 tablet by mouth at bedtime as needed.  Marland Kitchen HYDROcodone-acetaminophen (NORCO/VICODIN) 5-325 MG tablet Take one tablet po qhs prn pain  . ibandronate (BONIVA) 3 MG/3ML SOLN  injection Inject 3 mg into the vein every 3 (three) months.  Marland Kitchen lisinopril (ZESTRIL) 5 MG tablet Take one tablet.  . pantoprazole (PROTONIX) 40 MG tablet Take 1 tablet (40 mg total) by mouth daily.  . [DISCONTINUED] lisinopril (ZESTRIL) 5 MG tablet Take 1/2 tablet for 7 days, then one tablet.   No facility-administered encounter medications on file as of 05/04/2020.     Review of Systems  Constitutional: Negative.   HENT: Negative.   Respiratory: Negative for chest tightness and shortness of breath.   Cardiovascular: Negative for chest pain and leg swelling.  Gastrointestinal: Negative for abdominal pain.  Neurological: Negative.   Hematological: Negative for adenopathy.  Psychiatric/Behavioral: Negative.      Vitals BP 138/90   Pulse (!) 116   Temp 97.8 F (36.6 C)   Wt 178 lb 12.8 oz (81.1 kg)   SpO2 98%   BMI 32.70 kg/m   Objective:   Physical Exam Vitals and nursing note reviewed.  Constitutional:      Appearance: Normal appearance.  Cardiovascular:     Heart sounds: Normal heart sounds.  Pulmonary:     Effort: Pulmonary effort is normal.     Breath sounds: Normal breath sounds.  Skin:    General: Skin is warm and dry.  Neurological:     Mental Status: She is alert and oriented to person, place, and time.  Psychiatric:        Mood and Affect: Mood normal.        Behavior: Behavior normal.  Thought Content: Thought content normal.        Judgment: Judgment normal.      Assessment and Plan   1. Essential hypertension - lisinopril (ZESTRIL) 5 MG tablet; Take one tablet.  Dispense: 30 tablet; Refill: 2   Janice Higgins we will continue with her lifestyle changes, she will monitor her blood pressure 2-3 times per week now instead of daily, if she notices that the numbers are creeping up she will let us know.  I am so happy for her for the changes that she is making, and that she feels so much better now.  On October 18, she had a complete metabolic panel  drawn, this was reviewed in detail today, everything is normal, her kidney and liver function are normal.  Agrees with plan of care discussed today. Understands warning signs to seek further care:  Understands to follow-up inncreasing blood pressure, chest pain, shortness of breath. Understands to follow-up in 3 months, sooner if needed.  Pecolia Ades, FNP-C

## 2020-05-19 ENCOUNTER — Encounter (HOSPITAL_COMMUNITY): Payer: No Typology Code available for payment source

## 2020-05-29 MED FILL — PANTOPRAZOLE SOD DR 40 MG T: 40 | 30 days supply | Qty: 30 | Fill #2

## 2020-06-14 ENCOUNTER — Other Ambulatory Visit: Payer: Self-pay | Admitting: Family Medicine

## 2020-06-14 ENCOUNTER — Ambulatory Visit (INDEPENDENT_AMBULATORY_CARE_PROVIDER_SITE_OTHER): Payer: No Typology Code available for payment source | Admitting: Family Medicine

## 2020-06-14 ENCOUNTER — Encounter (HOSPITAL_COMMUNITY)
Admission: RE | Admit: 2020-06-14 | Discharge: 2020-06-14 | Disposition: A | Discharge status: Home / Self Care | Payer: No Typology Code available for payment source | Source: Ambulatory Visit | Attending: Family Medicine | Admitting: Family Medicine

## 2020-06-14 ENCOUNTER — Encounter (HOSPITAL_COMMUNITY): Payer: Self-pay

## 2020-06-14 ENCOUNTER — Other Ambulatory Visit: Payer: Self-pay

## 2020-06-14 ENCOUNTER — Encounter: Payer: Self-pay | Admitting: Family Medicine

## 2020-06-14 VITALS — BP 140/100 | HR 97 | Temp 97.2°F | Wt 179.2 lb

## 2020-06-14 DIAGNOSIS — C50912 Malignant neoplasm of unspecified site of left female breast: Secondary | ICD-10-CM | POA: Diagnosis not present

## 2020-06-14 DIAGNOSIS — I10 Essential (primary) hypertension: Secondary | ICD-10-CM

## 2020-06-14 DIAGNOSIS — M8580 Other specified disorders of bone density and structure, unspecified site: Secondary | ICD-10-CM

## 2020-06-14 DIAGNOSIS — M810 Age-related osteoporosis without current pathological fracture: Secondary | ICD-10-CM | POA: Insufficient documentation

## 2020-06-14 HISTORY — DX: Essential (primary) hypertension: I10

## 2020-06-14 MED ORDER — IBANDRONATE SODIUM 3 MG/3ML IV SOLN: 3.0000 mg | Freq: Once | INTRAVENOUS | Status: DC

## 2020-06-14 MED ORDER — AMLODIPINE BESYLATE 5 MG PO TABS: 5.0000 mg | ORAL_TABLET | Freq: Every day | ORAL | 1 refills | Status: DC

## 2020-06-14 MED FILL — AMLODIPINE BESYLATE 5 MG TA: 5 | 90 days supply | Qty: 90 | Fill #0

## 2020-06-14 NOTE — Patient Instructions (Signed)
Amlodipine tomorrow morning.  Then take lisinopril 5mg  at night.   Call if seeing numbers 140/80 or above or if under 100/60.

## 2020-06-14 NOTE — Progress Notes (Signed)
Patient ID: Janice Higgins, female    DOB: 1956-05-31, 64 y.o.   MRN: [id number]   Chief Complaint  Patient presents with  . Blood Pressure Check    Patient went in for Boniva injection and nurse would not give due to bp being 150/100. Patient reports she has been feeling well but bp has been creeping up since starting Lisinopril, lately at home is around 140/100. Patient took lisinopril around 9-9:30 this morning.    Subjective:    HPI   F/u htn-  Pt at infusion center today and supposed to get boniva injection, but BP was too high.  BP steadily going up, but has been having boniva injections every 3 months. Feeling better when on lisinopril 5mg .  Only ate 1 pieced of ham at holidays. Checking 1x per week. At home checking bp- was still going up. Went down to 130/90. At first on lisinopril. Tried fosamax in past.  Hurt stomach and vomiting from fosamax, so pt was started on boniva. On boniva- q 3 month injections.   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   Outpatient Encounter Medications as of 06/14/2020  Medication Sig  . acetaminophen (TYLENOL) 325 MG tablet Take 650 mg by mouth every 6 (six) hours as needed.  Marland Kitchen albuterol (PROVENTIL HFA;VENTOLIN HFA) 108 (90 Base) MCG/ACT inhaler Inhale 2 puffs into the lungs every 4 (four) hours as needed.  Marland Kitchen amLODipine (NORVASC) 5 MG tablet Take 1 tablet (5 mg total) by mouth daily.  . diclofenac (VOLTAREN) 75 MG EC tablet TAKE 1 TABLET BY MOUTH TWICE A DAY WITH MEALS  . Doxylamine Succinate, Sleep, (SLEEP AID PO) Take 1 tablet by mouth at bedtime as needed.  Marland Kitchen HYDROcodone-acetaminophen (NORCO/VICODIN) 5-325 MG tablet Take  one tablet po qhs prn pain  . ibandronate (BONIVA) 3 MG/3ML SOLN injection Inject 3 mg into the vein every 3 (three) months.  Marland Kitchen lisinopril (ZESTRIL) 5 MG tablet Take one tablet.  . pantoprazole (PROTONIX) 40 MG tablet Take 1 tablet (40 mg total) by mouth daily.  . [DISCONTINUED] ibandronate (BONIVA) injection 3 mg    No facility-administered encounter medications on file as of 06/14/2020.     Review of Systems  Constitutional: Negative for chills and fever.  HENT: Negative for congestion, rhinorrhea and sore throat.   Respiratory: Negative for cough, shortness of breath and wheezing.   Cardiovascular: Negative for chest pain and leg swelling.  Gastrointestinal: Negative for abdominal pain, diarrhea, nausea and vomiting.  Genitourinary: Negative for dysuria and frequency.  Musculoskeletal: Negative for arthralgias and back pain.  Skin: Negative for rash.  Neurological: Negative for dizziness, weakness and headaches.     Vitals BP (!) 140/100   Pulse 97   Temp (!) 97.2 F (36.2 C)   Wt 179 lb 3.2 oz (81.3 kg)   SpO2 97%   BMI 32.78 kg/m   Objective:   Physical Exam Vitals and nursing note reviewed.  Constitutional:      Appearance: Normal appearance.  HENT:     Head: Normocephalic and atraumatic.     Nose: Nose normal.     Mouth/Throat:     Mouth: Mucous membranes are moist.     Pharynx: Oropharynx is clear.  Eyes:     Extraocular Movements:  Extraocular movements intact.     Conjunctiva/sclera: Conjunctivae normal.     Pupils: Pupils are equal, round, and reactive to light.  Cardiovascular:     Rate and Rhythm: Normal rate and regular rhythm.     Pulses: Normal pulses.     Heart sounds: Normal heart sounds.  Pulmonary:     Effort: Pulmonary effort is normal.     Breath sounds: Normal breath sounds. No wheezing, rhonchi or rales.  Musculoskeletal:        General: Normal range of motion.     Right lower leg: No edema.     Left lower leg: No edema.  Skin:     General: Skin is warm and dry.     Findings: No lesion or rash.  Neurological:     General: No focal deficit present.     Mental Status: She is alert and oriented to person, place, and time.  Psychiatric:        Mood and Affect: Mood normal.        Behavior: Behavior normal.      Assessment and Plan   1. Essential hypertension  2. Osteopenia, unspecified location - DG Bone Density; Future    htn- uncontrolled.  Will add norvasc 5mg  in am and cont with lisinopril 5mg  at night. Check bp daily in afternoon or evening and call if seeing numbers over 150/90 or under 100/60.  Pt advised to take an extra 5mg  lisinopril today and start new medication tomorrow.  Osteopenia with h/o breast cancer- pt is intolerate of bisphosphonate. Pt wanting to see about changing the boniva.  Reschedule injection for next week for boniva. Needing [place] to reschedule the visit.  Wanting to possibly change to prolia.  Wanting to get prolia, might need letter written to get approved.   F/u 58mo.

## 2020-06-14 NOTE — Progress Notes (Signed)
Presents today for Boniva injection.  States she has had worsening hypertension since starting Boniva last November and has been started on lisinopril, which she hasn't taken this morning.  BP 153/100.  Per medication guidelines to use caution in hypertensive patients, call made to Dr. Lance Sell office.  Advised by Dr. Lovena Le to hold injection today and have her come to the office at 10 am for appointment.  Will await recommendations from physician prior to scheduling another injection.  Patient verbalizes understanding.

## 2020-06-20 DIAGNOSIS — M8580 Other specified disorders of bone density and structure, unspecified site: Secondary | ICD-10-CM

## 2020-06-22 ENCOUNTER — Telehealth: Payer: Self-pay

## 2020-06-22 ENCOUNTER — Telehealth: Payer: No Typology Code available for payment source | Admitting: Physician Assistant

## 2020-06-22 DIAGNOSIS — R059 Cough, unspecified: Secondary | ICD-10-CM | POA: Diagnosis not present

## 2020-06-22 DIAGNOSIS — J069 Acute upper respiratory infection, unspecified: Secondary | ICD-10-CM

## 2020-06-22 MED ORDER — PREDNISONE 5 MG PO TABS: ORAL_TABLET | ORAL | 0 refills | Status: DC

## 2020-06-22 NOTE — Telephone Encounter (Signed)
Patient advised per Dr Lovena Le: She can take over the counter, coricidin hpb and flonase.  Also can take plain robitussin or plain mucinex and increase fluids.    Call back if not improving.   Patient verbalized understanding

## 2020-06-22 NOTE — Telephone Encounter (Signed)
Pt has cough that is keeping her up at night and has high blood pressure and was wanting to know what she can take for the cough or can something get called in. We have no appt to get her in.  Pt call back [phone]

## 2020-06-22 NOTE — Telephone Encounter (Signed)
See my chart message

## 2020-06-22 NOTE — Telephone Encounter (Signed)
Patient notified and stated she would like referral to Dr Dorris Fetch endocrinology in [place]. Patient also states she has had continued cough and congestion since last week and her Covid test was negative and she doesn't know what she can take with her high blood pressure. Patient states she needs something for cough and congestion safe with HTN

## 2020-06-22 NOTE — Telephone Encounter (Signed)
She can take over the counter, coricidin hpb and flonase.  Also can take plain robitussin or plain mucinex and increase fluids.   Call back if not improving.   Pls give referral to endo. Thx.   Thx.  Dr. Lovena Le

## 2020-06-22 NOTE — Progress Notes (Signed)
We are sorry that you are not feeling well.  Here is how we plan to help!  Based on your presentation I believe you most likely have A cough due to a virus.  This is called viral bronchitis and is best treated by rest, plenty of fluids and control of the cough.  You may use Ibuprofen or Tylenol as directed to help your symptoms.     In addition you may use A prescription cough medication called Tessalon Perles 100mg . You may take 1-2 capsules every 8 hours as needed for your cough.  Prednisone 5 mg daily for 6 days (see taper instructions below)  Directions for 6 day taper: Day 1: 2 tablets before breakfast, 1 after both lunch & dinner and 2 at bedtime Day 2: 1 tab before breakfast, 1 after both lunch & dinner and 2 at bedtime Day 3: 1 tab at each meal & 1 at bedtime Day 4: 1 tab at breakfast, 1 at lunch, 1 at bedtime Day 5: 1 tab at breakfast & 1 tab at bedtime Day 6: 1 tab at breakfast  Use Flonase for your congestion.   From your responses in the eVisit questionnaire you describe inflammation in the upper respiratory tract which is causing a significant cough.  This is commonly called Bronchitis and has four common causes:    Allergies  Viral Infections  Acid Reflux  Bacterial Infection Allergies, viruses and acid reflux are treated by controlling symptoms or eliminating the cause. An example might be a cough caused by taking certain blood pressure medications. You stop the cough by changing the medication. Another example might be a cough caused by acid reflux. Controlling the reflux helps control the cough.  USE OF BRONCHODILATOR ("RESCUE") INHALERS: There is a risk from using your bronchodilator too frequently.  The risk is that over-reliance on a medication which only relaxes the muscles surrounding the breathing tubes can reduce the effectiveness of medications prescribed to reduce swelling and congestion of the tubes themselves.  Although you feel brief relief from the  bronchodilator inhaler, your asthma may actually be worsening with the tubes becoming more swollen and filled with mucus.  This can delay other crucial treatments, such as oral steroid medications. If you need to use a bronchodilator inhaler daily, several times per day, you should discuss this with your provider.  There are probably better treatments that could be used to keep your asthma under control.     HOME CARE . Only take medications as instructed by your medical team. . Complete the entire course of an antibiotic. . Drink plenty of fluids and get plenty of rest. . Avoid close contacts especially the very young and the elderly . Cover your mouth if you cough or cough into your sleeve. . Always remember to wash your hands . A steam or ultrasonic humidifier can help congestion.   GET HELP RIGHT AWAY IF: . You develop worsening fever. . You become short of breath . You cough up blood. . Your symptoms persist after you have completed your treatment plan MAKE SURE YOU   Understand these instructions.  Will watch your condition.  Will get help right away if you are not doing well or get worse.  Your e-visit answers were reviewed by a board certified advanced clinical practitioner to complete your personal care plan.  Depending on the condition, your plan could have included both over the counter or prescription medications. If there is a problem please reply  once you have received  a response from your provider. Your safety is important to [place].  If you have drug allergies check your prescription carefully.    You can use MyChart to ask questions about today's visit, request a non-urgent call back, or ask for a work or school excuse for 24 hours related to this e-Visit. If it has been greater than 24 hours you will need to follow up with your provider, or enter a new e-Visit to address those concerns. You will get an e-mail in the next two days asking about your experience.  I hope that  your e-visit has been valuable and will speed your recovery. Thank you for using e-visits.  Greater than 5 minutes, yet less than 10 minutes of time have been spent researching, coordinating and implementing care for this patient today.

## 2020-06-23 ENCOUNTER — Other Ambulatory Visit: Payer: Self-pay

## 2020-06-23 ENCOUNTER — Encounter (HOSPITAL_COMMUNITY)
Admission: RE | Admit: 2020-06-23 | Discharge: 2020-06-23 | Disposition: A | Discharge status: Home / Self Care | Payer: No Typology Code available for payment source | Source: Ambulatory Visit | Attending: Family Medicine | Admitting: Family Medicine

## 2020-06-23 ENCOUNTER — Encounter (HOSPITAL_COMMUNITY): Payer: Self-pay

## 2020-06-23 DIAGNOSIS — M810 Age-related osteoporosis without current pathological fracture: Secondary | ICD-10-CM | POA: Diagnosis present

## 2020-06-23 LAB — POCT I-STAT, CHEM 8
BUN: 19 mg/dL (ref 8–23)
Calcium, Ion: 1.1 mmol/L — ABNORMAL LOW (ref 1.15–1.40)
Chloride: 106 mmol/L (ref 98–111)
Creatinine, Ser: 0.6 mg/dL (ref 0.44–1.00)
Glucose, Bld: 106 mg/dL — ABNORMAL HIGH (ref 70–99)
HCT: 42 % (ref 36.0–46.0)
Hemoglobin: 14.3 g/dL (ref 12.0–15.0)
Potassium: 4 mmol/L (ref 3.5–5.1)
Sodium: 141 mmol/L (ref 135–145)
TCO2: 22 mmol/L (ref 22–32)

## 2020-06-23 MED ORDER — IBANDRONATE SODIUM 3 MG/3ML IV SOLN
3.0000 mg | Freq: Once | INTRAVENOUS | Status: AC
  Administered 2020-06-23: 3 mg via INTRAVENOUS

## 2020-06-26 ENCOUNTER — Other Ambulatory Visit: Payer: Self-pay | Admitting: Family Medicine

## 2020-06-26 MED FILL — DICLOFENAC SOD EC 75 MG TAB: 75 | 30 days supply | Qty: 60 | Fill #3

## 2020-06-26 MED FILL — PANTOPRAZOLE SOD DR 40 MG T: 40 | 30 days supply | Qty: 30 | Fill #0

## 2020-06-26 MED FILL — LISINOPRIL 5 MG TABS: 5 | 30 days supply | Qty: 30 | Fill #1

## 2020-07-19 ENCOUNTER — Ambulatory Visit (INDEPENDENT_AMBULATORY_CARE_PROVIDER_SITE_OTHER): Payer: No Typology Code available for payment source | Admitting: Family Medicine

## 2020-07-19 ENCOUNTER — Other Ambulatory Visit: Payer: Self-pay

## 2020-07-19 ENCOUNTER — Other Ambulatory Visit: Payer: Self-pay | Admitting: Family Medicine

## 2020-07-19 ENCOUNTER — Encounter: Payer: Self-pay | Admitting: Family Medicine

## 2020-07-19 VITALS — BP 130/90 | HR 112 | Temp 97.6°F | Wt 180.8 lb

## 2020-07-19 DIAGNOSIS — R21 Rash and other nonspecific skin eruption: Secondary | ICD-10-CM | POA: Diagnosis not present

## 2020-07-19 DIAGNOSIS — M545 Low back pain, unspecified: Secondary | ICD-10-CM

## 2020-07-19 DIAGNOSIS — G8929 Other chronic pain: Secondary | ICD-10-CM | POA: Diagnosis not present

## 2020-07-19 DIAGNOSIS — I10 Essential (primary) hypertension: Secondary | ICD-10-CM | POA: Diagnosis not present

## 2020-07-19 MED ORDER — HYDROCODONE-ACETAMINOPHEN 5-325 MG PO TABS: ORAL_TABLET | ORAL | 0 refills | Status: DC

## 2020-07-19 MED ORDER — LISINOPRIL 5 MG PO TABS: ORAL_TABLET | ORAL | 2 refills | Status: DC

## 2020-07-19 NOTE — Progress Notes (Signed)
Patient ID: Janice Higgins, female    DOB: 06/26/1956, 65 y.o.   MRN: [id number]   Chief Complaint  Patient presents with  . Hypertension    Patient comes in to follow up on hypertension.  No concerns.    Subjective:  CC: follow-up for HTN  This is a chronic problem.  Presents today to follow-up for hypertension.  Reports that she has been taking her blood pressures at home several times per week, on December 1 she was attempting to get her Boniva injection, her blood pressure was elevated and she was sent to this office, seen by Dr. Ladona Ridgel.  Amlodipine was added to her regimen on December 1.  She brings in her blood pressure log today, her ranges are 115-135/76-98.  Blood pressure is elevated in the office today, she reports that someone made her angry at work right before coming to the office.  She wanted me to look at a rash on her left lower leg, is no itching, no pain, it is a papular rash that she said occurred on the opposite leg that resolved on its own.  She was contacted by Dr. Isidoro Donning office endocrinology for her Boniva medication.  She has not made that appointment yet she will make that for January 2022.    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   Outpatient Encounter Medications as of 07/19/2020  Medication Sig  . acetaminophen (TYLENOL) 325 MG tablet Take 650 mg by mouth every 6 (six) hours as needed.  Marland Kitchen albuterol (PROVENTIL HFA;VENTOLIN HFA) 108 (90 Base) MCG/ACT inhaler Inhale 2 puffs into the lungs every 4 (four) hours as needed.  Marland Kitchen amLODipine (NORVASC) 5 MG tablet Take 1 tablet (5 mg total) by mouth daily.  . diclofenac (VOLTAREN) 75 MG EC tablet TAKE 1 TABLET  BY MOUTH TWICE A DAY WITH MEALS  . Doxylamine Succinate, Sleep, (SLEEP AID PO) Take 1 tablet by mouth at bedtime as needed.  Marland Kitchen HYDROcodone-acetaminophen (NORCO/VICODIN) 5-325 MG tablet Take one tablet po qhs prn pain  . ibandronate (BONIVA) 3 MG/3ML SOLN injection Inject 3 mg into the vein every 3 (three) months.  Marland Kitchen lisinopril (ZESTRIL) 5 MG tablet Take one tablet.  . pantoprazole (PROTONIX) 40 MG tablet TAKE 1 TABLET BY MOUTH DAILY  . [DISCONTINUED] HYDROcodone-acetaminophen (NORCO/VICODIN) 5-325 MG tablet Take one tablet po qhs prn pain  . [DISCONTINUED] lisinopril (ZESTRIL) 5 MG tablet Take one tablet.  . [DISCONTINUED] predniSONE (DELTASONE) 5 MG tablet Day 1: 2 tabs before breakfast, 1 after both lunch & dinner and 2 at bedtime; Day 2: 1 tab before breakfast, 1 after both lunch & dinner and 2 at bedtime; Day 3: 1 tab at each meal & 1 at bedtime; Day 4: 1 tab at breakfast, 1 at lunch, 1 at bedtime; Day 5: 1 tab at breakfast & 1 tab at bedtime; Day 6: 1 tab at breakfast   No facility-administered encounter medications on file as of 07/19/2020.     Review of Systems  Constitutional: Negative for chills and fever.  HENT: Negative for congestion.   Respiratory: Negative for cough and shortness of breath.   Cardiovascular: Negative for chest pain, palpitations and leg swelling.  Gastrointestinal: Negative for abdominal pain and blood in stool.  Genitourinary: Negative for  dysuria and flank pain.  Skin: Positive for rash.       Left ankle: not itching, no pain. Red, papular.      Vitals BP 130/90   Pulse (!) 112   Temp 97.6 F (36.4 C)   Wt 180 lb 12.8 oz (82 kg)   SpO2 96%   BMI 33.07 kg/m   Objective:   Physical Exam Vitals and nursing note reviewed.  Constitutional:      Appearance: Normal appearance.  Cardiovascular:     Rate and Rhythm: Normal rate and regular rhythm.     Heart sounds: Normal heart sounds.  Pulmonary:     Effort: Pulmonary effort is normal.     Breath  sounds: Normal breath sounds.  Skin:    General: Skin is warm and dry.  Neurological:     General: No focal deficit present.     Mental Status: She is alert.  Psychiatric:        Behavior: Behavior normal.      Assessment and Plan   1. Essential hypertension - lisinopril (ZESTRIL) 5 MG tablet; Take one tablet.  Dispense: 30 tablet; Refill: 2 - Comprehensive Metabolic Panel (CMET)  2. Chronic low back pain, unspecified back pain laterality, unspecified whether sciatica present - HYDROcodone-acetaminophen (NORCO/VICODIN) 5-325 MG tablet; Take one tablet po qhs prn pain  Dispense: 30 tablet; Refill: 0  3. Rash in adult    Hypertension Medication compliance: taking daily as prescribed Denies chest pain, shortness of breath, lower extremity edema, vision changes, headaches.  Pertinent lab work: last 05-01-20: kidney function normal ASCVD 10- year risk score: 5% (blood pressure not optimal in office today) Monitoring: checks blood pressure 3 times per week. Labs every 3 months until blood pressure stabilizes.  Continue current medication regimen: continue with current regimen.  Refill provided for lisinopril.  Chronic low back pain: She received a prescription for hydrocodone on December 31, 2019, she reports that she takes one half of a tablet at night when needed, she is requesting a refill today.  She reports that she does not take this every day.  High-risk medication warning: Opioids and benzodiazepines are medications that can cause dependence. You should only take them on an as needed basis. Do not drive and/or operate dangerous machinery while taking these medications. It is advised to take these medications the minimal amount needed.  Rash: Rash on left ankle, erythematous, papular, not painful not itching.  Will watch and see if it resolves on its own.  Not causing her any discomfort at this time.  Agrees with plan of care discussed today. Understands warning signs to seek  further care: Chest pain, shortness of breath, any significant change in health. Understands to follow-up in 3 months, with labs 1 week prior to visit.  Lab order placed today.  She will make appointment with endocrinologist for [facility].  Pecolia Ades, FNP-C

## 2020-07-19 NOTE — Patient Instructions (Signed)

## 2020-07-27 MED FILL — PANTOPRAZOLE SOD DR 40 MG T: 40 | 30 days supply | Qty: 30 | Fill #1

## 2020-07-27 MED FILL — LISINOPRIL 5 MG TABS: 5 | 30 days supply | Qty: 30 | Fill #2

## 2020-08-04 ENCOUNTER — Ambulatory Visit: Payer: No Typology Code available for payment source | Admitting: Family Medicine

## 2020-08-14 ENCOUNTER — Other Ambulatory Visit: Payer: Self-pay

## 2020-08-14 ENCOUNTER — Encounter: Payer: Self-pay | Admitting: Family Medicine

## 2020-08-14 ENCOUNTER — Other Ambulatory Visit (HOSPITAL_COMMUNITY)
Admission: RE | Admit: 2020-08-14 | Discharge: 2020-08-14 | Disposition: A | Discharge status: Home / Self Care | Payer: No Typology Code available for payment source | Source: Ambulatory Visit | Attending: Family Medicine | Admitting: Family Medicine

## 2020-08-14 ENCOUNTER — Ambulatory Visit (HOSPITAL_COMMUNITY)
Admission: RE | Admit: 2020-08-14 | Discharge: 2020-08-14 | Disposition: A | Discharge status: Home / Self Care | Payer: No Typology Code available for payment source | Source: Ambulatory Visit | Attending: Family Medicine | Admitting: Family Medicine

## 2020-08-14 ENCOUNTER — Ambulatory Visit (INDEPENDENT_AMBULATORY_CARE_PROVIDER_SITE_OTHER): Payer: No Typology Code available for payment source | Admitting: Family Medicine

## 2020-08-14 VITALS — BP 132/88 | HR 119 | Temp 96.3°F | Wt 183.6 lb

## 2020-08-14 DIAGNOSIS — M79605 Pain in left leg: Secondary | ICD-10-CM | POA: Insufficient documentation

## 2020-08-14 LAB — D-DIMER, QUANTITATIVE: D-Dimer, Quant: 0.42 ug/mL-FEU (ref 0.00–0.50)

## 2020-08-14 NOTE — Telephone Encounter (Signed)
Pt scheduled for 2:30 pm today per Dr.Scott

## 2020-08-14 NOTE — Progress Notes (Signed)
   Subjective:    Patient ID: Janice Higgins, female    DOB: May 19, 1956, 65 y.o.   MRN: [id number]  HPI Pt having left leg pain. Pt has arthritis in legs but this feels different. Pt has been taking Voltaren but that is not helping. Friday night pt noticed that leg was very painful down the sides and behind knee pain. Throbbing pain when laying down but when pt stood up, it became better. Some warmth- did try heating pad. Pt did elevate leg.   Since Friday night Woke her up She states she knows she has arthritis in both of her knees but this does not feel like that according to the patient she states the pain is severe woke her up from her sleep hurts in the back of her leg into her calf she denies wheezing or difficulty breathing Review of Systems Please see above    Objective:   Physical Exam  Lungs clear respiratory rate normal heart regular no murmurs extremities she has tenderness behind the left knee and into the left calf no guarding or rebound her left leg is 1/2 inch bigger in circumference compared to the right also has some varicose veins but they do not appear to be thrombosed currently      Assessment & Plan:  Left leg pain Increased circumference Recommend ultrasound and D-dimer to rule out blood clot Ultrasound came back negative D-dimer negative Supportive measures including OTC diclofenac twice daily for the next week then may revert back to 1 daily Also in addition to that may use the cream as necessary Voltaren follow-up if ongoing troubles

## 2020-08-17 ENCOUNTER — Encounter: Payer: Self-pay | Admitting: Family Medicine

## 2020-08-17 NOTE — Telephone Encounter (Signed)
Nurses I would recommend prednisone 20 mg, 2 daily for 5 days  I also recommend consultation with orthopedics thank you

## 2020-08-18 ENCOUNTER — Other Ambulatory Visit: Payer: Self-pay | Admitting: *Deleted

## 2020-08-18 ENCOUNTER — Other Ambulatory Visit: Payer: Self-pay | Admitting: Family Medicine

## 2020-08-18 DIAGNOSIS — I10 Essential (primary) hypertension: Secondary | ICD-10-CM

## 2020-08-18 DIAGNOSIS — M79606 Pain in leg, unspecified: Secondary | ICD-10-CM

## 2020-08-18 MED ORDER — PREDNISONE 20 MG PO TABS: ORAL_TABLET | ORAL | 0 refills | Status: DC

## 2020-08-21 MED FILL — LISINOPRIL 5 MG TABS: 5 | 90 days supply | Qty: 30 | Fill #0

## 2020-08-21 MED FILL — PANTOPRAZOLE SOD DR 40 MG T: 40 | 30 days supply | Qty: 30 | Fill #2

## 2020-08-22 ENCOUNTER — Telehealth: Payer: Self-pay | Admitting: Family Medicine

## 2020-08-22 MED ORDER — DICLOFENAC SODIUM 75 MG PO TBEC: 75.0000 mg | DELAYED_RELEASE_TABLET | Freq: Two times a day (BID) | ORAL | 0 refills | Status: DC

## 2020-08-22 NOTE — Telephone Encounter (Signed)
Last seen 08/14/20 for leg pain. Please advise. Thank you

## 2020-08-22 NOTE — Telephone Encounter (Signed)
patient is requesting refill on diclofenac 75 mg for her arthritis to be called into [facility] pharmacy she states only has 2 pills left.

## 2020-08-22 NOTE — Telephone Encounter (Signed)
Pt contacted and verbalized understanding.  

## 2020-09-04 ENCOUNTER — Ambulatory Visit: Payer: No Typology Code available for payment source

## 2020-09-04 ENCOUNTER — Ambulatory Visit (INDEPENDENT_AMBULATORY_CARE_PROVIDER_SITE_OTHER): Payer: No Typology Code available for payment source | Admitting: Orthopedic Surgery

## 2020-09-04 ENCOUNTER — Encounter: Payer: Self-pay | Admitting: Orthopedic Surgery

## 2020-09-04 ENCOUNTER — Other Ambulatory Visit: Payer: Self-pay

## 2020-09-04 ENCOUNTER — Other Ambulatory Visit: Payer: Self-pay | Admitting: Orthopedic Surgery

## 2020-09-04 VITALS — BP 123/82 | HR 102 | Ht 62.0 in | Wt 184.0 lb

## 2020-09-04 DIAGNOSIS — M25562 Pain in left knee: Secondary | ICD-10-CM

## 2020-09-04 DIAGNOSIS — M545 Low back pain, unspecified: Secondary | ICD-10-CM | POA: Diagnosis not present

## 2020-09-04 MED ORDER — METHOCARBAMOL 750 MG PO TABS: 750.0000 mg | ORAL_TABLET | Freq: Every evening | ORAL | 2 refills | Status: DC

## 2020-09-04 MED FILL — METHOCARBAMOL 750 MG TABS: 750 | 30 days supply | Qty: 30 | Fill #0

## 2020-09-04 NOTE — Progress Notes (Signed)
NEW PROBLEM//OFFICE VISIT  Summary assessment and plan:   65 years old with what I think is referred pain from her back to her knee she does have some arthritis but diclofenac seems to be helping that is most of her pain is when she is lying with her back in extension  She will take Robaxin continue her diclofenac follow-up with [place] as needed   Chief Complaint  Patient presents with  . Knee Pain    Left knee, patient reports that she has had pain for a month,     65 year old female with chronic pain left knee.  She is coming in today for pain left knee which is exacerbated when she is lying flat on her back with pain running down the back of her leg she does have some anterolateral knee pain as well which is chronic.  She is on diclofenac for that  She has right knee pain in the past for which she got an injection  No mechanical symptoms noted in the knee at this time   Review of Systems  Constitutional: Negative for chills and fever.  Respiratory: Negative for shortness of breath.   Cardiovascular: Negative for chest pain.     Past Medical History:  Diagnosis Date  . Breast cancer ([place]) 2015   DCIS  . 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 Family history of breast cancer   . History of breast cancer 04/25/2015  . History of bronchitis   . History of kidney stones   . Hypertension   . Osteoarthritis    hands and feet  . PMB (postmenopausal bleeding) 04/25/2015  . Radiation 10/13/14-11/04/14   Left breast    Past Surgical History:  Procedure Laterality Date  . BREAST LUMPECTOMY WITH RADIOACTIVE SEED LOCALIZATION Left 08/12/2014   Procedure:  RADIOACTIVE SEED LOCALIZATION LEFT BREAST LUMPECTOMY;  Surgeon: Excell Seltzer, MD;  Location: [place];  Service: General;  Laterality: Left;  . COLONOSCOPY N/A 05/04/2015   Procedure: COLONOSCOPY;  Surgeon: Rogene Houston, MD;  [facility];  Service: Endoscopy;  Laterality: N/A;   7:30  . FOOT SURGERY Right 1995   exc. bone spur  . LASIK    . RE-EXCISION OF BREAST LUMPECTOMY Left 08/22/2014   Procedure: RE-EXCISION OF LEFT BREAST LUMPECTOMY;  Surgeon: Excell Seltzer, MD;  Location: [place];  Service: General;  Laterality: Left;  . RE-EXCISION OF BREAST LUMPECTOMY Left 09/05/2014   Procedure: RE-EXCISION OF BREAST LUMPECTOMY;  Surgeon: Excell Seltzer, MD;  Location: [place];  Service: General;  Laterality: Left;  . TONSILLECTOMY    . TUBAL LIGATION      Family History  Problem Relation Age of Onset  . Hypertension Father   . Hypertension Mother   . Heart disease Mother 109  . Breast cancer Sister 40  . Other Brother        lived 3 months  . Heart attack Maternal Grandfather   . Emphysema Paternal Grandfather   . COPD Paternal Grandfather    Social History   Tobacco Use  . Smoking status: Never Smoker  . Smokeless tobacco: Never Used  Vaping Use  . Vaping Use: Never used  Substance Use Topics  . Alcohol use: No  . Drug use: No    Allergies  Allergen Reactions  . Fosamax [Alendronate Sodium] Other (See Comments)    Severe GI Upset  . Sulfa Antibiotics Hives    Current Meds  Medication Sig  .  acetaminophen (TYLENOL) 325 MG tablet Take 650 mg by mouth every 6 (six) hours as needed.  Marland Kitchen albuterol (PROVENTIL HFA;VENTOLIN HFA) 108 (90 Base) MCG/ACT inhaler Inhale 2 puffs into the lungs every 4 (four) hours as needed.  Marland Kitchen amLODipine (NORVASC) 5 MG tablet Take 1 tablet (5 mg total) by mouth daily.  . diclofenac (VOLTAREN) 75 MG EC tablet Take 1 tablet (75 mg total) by mouth 2 (two) times daily with a meal.  . Doxylamine Succinate, Sleep, (SLEEP AID PO) Take 1 tablet by mouth at bedtime as needed.  Marland Kitchen HYDROcodone-acetaminophen (NORCO/VICODIN) 5-325 MG tablet Take one tablet po qhs prn pain  . ibandronate (BONIVA) 3 MG/3ML SOLN injection Inject 3 mg into the vein every 3 (three) months.  Marland Kitchen lisinopril (ZESTRIL) 5 MG tablet TAKE 1 TABLET BY  MOUTH DAILY  . methocarbamol (ROBAXIN) 750 MG tablet Take 1 tablet (750 mg total) by mouth at bedtime.  . pantoprazole (PROTONIX) 40 MG tablet TAKE 1 TABLET BY MOUTH DAILY  . predniSONE (DELTASONE) 20 MG tablet Take 2 tablets daily for 5 days    BP 123/82   Pulse (!) 102   Ht 5\' 2"  (1.575 m)   Wt 184 lb (83.5 kg)   BMI 33.65 kg/m   Physical Exam Constitutional:      General: She is not in acute distress.    Appearance: She is well-developed.     Comments: Well developed, well nourished Normal grooming and hygiene     Cardiovascular:     Comments: No peripheral edema Musculoskeletal:     Comments: LEFT KNEE:   Lateral tenderness no effusion range of motion seems pretty good no instability muscle tone normal skin intact  Lumbar spine tenderness noted left side no midline tenderness no right-sided tenderness  Skin:    General: Skin is warm and dry.  Neurological:     Mental Status: She is alert and oriented to person, place, and time.     Sensory: No sensory deficit.     Coordination: Coordination normal.     Gait: Gait normal.     Deep Tendon Reflexes: Reflexes are normal and symmetric.  Psychiatric:        Mood and Affect: Mood normal.        Behavior: Behavior normal.        Thought Content: Thought content normal.        Judgment: Judgment normal.     Comments: Affect normal          MEDICAL DECISION MAKING  A.  Encounter Diagnoses  Name Primary?  . Acute pain of left knee   . Left low back pain, unspecified chronicity, unspecified whether sciatica present Yes    B. DATA ANALYSED:   IMAGING:   Outside images  reviewed: 2021: I read OA lateral compartment   Internal images today:   Report:  IMPRESSION: No fracture or dislocation of the left knee. There is mild tricompartmental joint space narrowing and osteophytosis, worst in the lateral compartment.   Electronically Signed   By: Eddie Candle M.D.   On: 04/02/2020 11:14  C. MANAGEMENT    Conservative:    Meds ordered this encounter  Medications  . methocarbamol (ROBAXIN) 750 MG tablet    Sig: Take 1 tablet (750 mg total) by mouth at bedtime.    Dispense:  30 tablet    Refill:  2   Chronic with exacerbation Outside film Prescription   Arther Abbott, MD  09/04/2020 4:33 PM

## 2020-09-04 NOTE — Patient Instructions (Addendum)
Continue the diclofenac for the knee , start robaxin at night   You have referred pain from the back to the knee

## 2020-09-11 MED FILL — AMLODIPINE BESYLATE 5 MG TA: 5 | 90 days supply | Qty: 90 | Fill #1

## 2020-09-12 ENCOUNTER — Encounter (HOSPITAL_COMMUNITY): Payer: No Typology Code available for payment source

## 2020-09-21 ENCOUNTER — Encounter (HOSPITAL_COMMUNITY): Payer: Self-pay

## 2020-09-21 ENCOUNTER — Encounter (HOSPITAL_COMMUNITY)
Admission: RE | Admit: 2020-09-21 | Discharge: 2020-09-21 | Disposition: A | Discharge status: Home / Self Care | Payer: No Typology Code available for payment source | Source: Ambulatory Visit | Attending: Family Medicine | Admitting: Family Medicine

## 2020-09-21 ENCOUNTER — Other Ambulatory Visit: Payer: Self-pay

## 2020-09-21 DIAGNOSIS — M810 Age-related osteoporosis without current pathological fracture: Secondary | ICD-10-CM | POA: Diagnosis present

## 2020-09-21 HISTORY — DX: Age-related osteoporosis without current pathological fracture: M81.0

## 2020-09-21 MED ORDER — IBANDRONATE SODIUM 3 MG/3ML IV SOLN
3.0000 mg | Freq: Once | INTRAVENOUS | Status: AC
  Administered 2020-09-21: 3 mg via INTRAVENOUS

## 2020-09-22 LAB — POCT I-STAT, CHEM 8
BUN: 21 mg/dL (ref 8–23)
Calcium, Ion: 1.21 mmol/L (ref 1.15–1.40)
Chloride: 104 mmol/L (ref 98–111)
Creatinine, Ser: 0.6 mg/dL (ref 0.44–1.00)
Glucose, Bld: 95 mg/dL (ref 70–99)
HCT: 41 % (ref 36.0–46.0)
Hemoglobin: 13.9 g/dL (ref 12.0–15.0)
Potassium: 4.1 mmol/L (ref 3.5–5.1)
Sodium: 141 mmol/L (ref 135–145)
TCO2: 24 mmol/L (ref 22–32)

## 2020-09-26 ENCOUNTER — Other Ambulatory Visit: Payer: Self-pay | Admitting: Family Medicine

## 2020-10-02 MED FILL — METHOCARBAMOL 750 MG TABS: 750 | 30 days supply | Qty: 30 | Fill #1

## 2020-10-03 ENCOUNTER — Other Ambulatory Visit: Payer: Self-pay | Admitting: Family Medicine

## 2020-10-03 ENCOUNTER — Ambulatory Visit (HOSPITAL_COMMUNITY)
Admission: RE | Admit: 2020-10-03 | Discharge: 2020-10-03 | Disposition: A | Discharge status: Home / Self Care | Payer: No Typology Code available for payment source | Source: Ambulatory Visit | Attending: Family Medicine | Admitting: Family Medicine

## 2020-10-03 DIAGNOSIS — M8580 Other specified disorders of bone density and structure, unspecified site: Secondary | ICD-10-CM | POA: Insufficient documentation

## 2020-10-03 MED ORDER — DICLOFENAC SODIUM 75 MG PO TBEC: 75.0000 mg | DELAYED_RELEASE_TABLET | Freq: Two times a day (BID) | ORAL | 0 refills | Status: DC

## 2020-10-03 MED FILL — DICLOFENAC SOD EC 75 MG TAB: 75 | 30 days supply | Qty: 60 | Fill #0

## 2020-10-10 LAB — COMPREHENSIVE METABOLIC PANEL
ALT: 28 IU/L (ref 0–32)
AST: 25 IU/L (ref 0–40)
Albumin/Globulin Ratio: 1.6 (ref 1.2–2.2)
Albumin: 4.5 g/dL (ref 3.8–4.8)
Alkaline Phosphatase: 61 IU/L (ref 44–121)
BUN/Creatinine Ratio: 19 (ref 12–28)
BUN: 15 mg/dL (ref 8–27)
Bilirubin Total: 0.2 mg/dL (ref 0.0–1.2)
CO2: 19 mmol/L — ABNORMAL LOW (ref 20–29)
Calcium: 9.6 mg/dL (ref 8.7–10.3)
Chloride: 107 mmol/L — ABNORMAL HIGH (ref 96–106)
Creatinine, Ser: 0.79 mg/dL (ref 0.57–1.00)
Globulin, Total: 2.8 g/dL (ref 1.5–4.5)
Glucose: 98 mg/dL (ref 65–99)
Potassium: 4.2 mmol/L (ref 3.5–5.2)
Sodium: 143 mmol/L (ref 134–144)
Total Protein: 7.3 g/dL (ref 6.0–8.5)
eGFR: 84 mL/min/{1.73_m2} (ref >59)

## 2020-10-14 ENCOUNTER — Other Ambulatory Visit (HOSPITAL_COMMUNITY): Payer: Self-pay

## 2020-10-17 ENCOUNTER — Other Ambulatory Visit: Payer: Self-pay

## 2020-10-17 ENCOUNTER — Encounter: Payer: Self-pay | Admitting: Family Medicine

## 2020-10-17 ENCOUNTER — Other Ambulatory Visit (HOSPITAL_COMMUNITY): Payer: Self-pay

## 2020-10-17 ENCOUNTER — Ambulatory Visit (INDEPENDENT_AMBULATORY_CARE_PROVIDER_SITE_OTHER): Payer: No Typology Code available for payment source | Admitting: Family Medicine

## 2020-10-17 VITALS — BP 109/72 | HR 96 | Temp 97.3°F | Ht 62.0 in | Wt 187.0 lb

## 2020-10-17 DIAGNOSIS — I10 Essential (primary) hypertension: Secondary | ICD-10-CM | POA: Diagnosis not present

## 2020-10-17 DIAGNOSIS — M545 Low back pain, unspecified: Secondary | ICD-10-CM | POA: Diagnosis not present

## 2020-10-17 DIAGNOSIS — G8929 Other chronic pain: Secondary | ICD-10-CM

## 2020-10-17 MED ORDER — LISINOPRIL 5 MG PO TABS
ORAL_TABLET | Freq: Every day | ORAL | 1 refills | Status: DC
  Filled 2020-10-17 – 2020-10-30 (×2): qty 90, 90d supply, fill #0

## 2020-10-17 MED ORDER — AMLODIPINE BESYLATE 5 MG PO TABS
ORAL_TABLET | Freq: Every day | ORAL | 1 refills | Status: DC
  Filled 2020-10-17 – 2020-12-08 (×3): qty 90, 90d supply, fill #0

## 2020-10-17 MED ORDER — DICLOFENAC SODIUM 75 MG PO TBEC
DELAYED_RELEASE_TABLET | ORAL | 0 refills | Status: DC
  Filled 2020-10-17: qty 90, 90d supply, fill #0
  Filled 2020-11-27: qty 90, 45d supply, fill #0

## 2020-10-17 NOTE — Patient Instructions (Addendum)
DASH Eating Plan DASH stands for Dietary Approaches to Stop Hypertension. The DASH eating plan is a healthy eating plan that has been shown to:  Reduce high blood pressure (hypertension).  Reduce your risk for type 2 diabetes, heart disease, and stroke.  Help with weight loss. What are tips for following this plan? Reading food labels  Check food labels for the amount of salt (sodium) per serving. Choose foods with less than 5 percent of the Daily Value of sodium. Generally, foods with less than 300 milligrams (mg) of sodium per serving fit into this eating plan.  To find whole grains, look for the word "whole" as the first word in the ingredient list. Shopping  Buy products labeled as "low-sodium" or "no salt added."  Buy fresh foods. Avoid canned foods and pre-made or frozen meals. Cooking  Avoid adding salt when cooking. Use salt-free seasonings or herbs instead of table salt or sea salt. Check with your health care provider or pharmacist before using salt substitutes.  Do not fry foods. Cook foods using healthy methods such as baking, boiling, grilling, roasting, and broiling instead.  Cook with heart-healthy oils, such as olive, canola, avocado, soybean, or sunflower oil. Meal planning  Eat a balanced diet that includes: ? 4 or more servings of fruits and 4 or more servings of vegetables each day. Try to fill one-half of your plate with fruits and vegetables. ? 6-8 servings of whole grains each day. ? Less than 6 oz (170 g) of lean meat, poultry, or fish each day. A 3-oz (85-g) serving of meat is about the same size as a deck of cards. One egg equals 1 oz (28 g). ? 2-3 servings of low-fat dairy each day. One serving is 1 cup (237 mL). ? 1 serving of nuts, seeds, or beans 5 times each week. ? 2-3 servings of heart-healthy fats. Healthy fats called omega-3 fatty acids are found in foods such as walnuts, flaxseeds, fortified milks, and eggs. These fats are also found in  cold-water fish, such as sardines, salmon, and mackerel.  Limit how much you eat of: ? Canned or prepackaged foods. ? Food that is high in trans fat, such as some fried foods. ? Food that is high in saturated fat, such as fatty meat. ? Desserts and other sweets, sugary drinks, and other foods with added sugar. ? Full-fat dairy products.  Do not salt foods before eating.  Do not eat more than 4 egg yolks a week.  Try to eat at least 2 vegetarian meals a week.  Eat more home-cooked food and less restaurant, buffet, and fast food.   Lifestyle  When eating at a restaurant, ask that your food be prepared with less salt or no salt, if possible.  If you drink alcohol: ? Limit how much you use to:  0-1 drink a day for women who are not pregnant.  0-2 drinks a day for men. ? Be aware of how much alcohol is in your drink. In the [place] one drink equals one 12 oz bottle of beer (355 mL), one 5 oz glass of wine (148 mL), or one 1 oz glass of hard liquor (44 mL). General information  Avoid eating more than 2,300 mg of salt a day. If you have hypertension, you may need to reduce your sodium intake to 1,500 mg a day.  Work with your health care provider to maintain a healthy body weight or to lose weight. Ask what an ideal weight is for you.    Get at least 30 minutes of exercise that causes your heart to beat faster (aerobic exercise) most days of the week. Activities may include walking, swimming, or biking.  Work with your health care provider or dietitian to adjust your eating plan to your individual calorie needs. What foods should I eat? Fruits All fresh, dried, or frozen fruit. Canned fruit in natural juice (without added sugar). Vegetables Fresh or frozen vegetables (raw, steamed, roasted, or grilled). Low-sodium or reduced-sodium tomato and vegetable juice. Low-sodium or reduced-sodium tomato sauce and tomato paste. Low-sodium or reduced-sodium canned vegetables. Grains Whole-grain  or whole-wheat bread. Whole-grain or whole-wheat pasta. Brown rice. Oatmeal. Quinoa. Bulgur. Whole-grain and low-sodium cereals. Pita bread. Low-fat, low-sodium crackers. Whole-wheat flour tortillas. Meats and other proteins Skinless chicken or turkey. Ground chicken or turkey. Pork with fat trimmed off. Fish and seafood. Egg whites. Dried beans, peas, or lentils. Unsalted nuts, nut butters, and seeds. Unsalted canned beans. Lean cuts of beef with fat trimmed off. Low-sodium, lean precooked or cured meat, such as sausages or meat loaves. Dairy Low-fat (1%) or fat-free (skim) milk. Reduced-fat, low-fat, or fat-free cheeses. Nonfat, low-sodium ricotta or cottage cheese. Low-fat or nonfat yogurt. Low-fat, low-sodium cheese. Fats and oils Soft margarine without trans fats. Vegetable oil. Reduced-fat, low-fat, or light mayonnaise and salad dressings (reduced-sodium). Canola, safflower, olive, avocado, soybean, and sunflower oils. Avocado. Seasonings and condiments Herbs. Spices. Seasoning mixes without salt. Other foods Unsalted popcorn and pretzels. Fat-free sweets. The items listed above may not be a complete list of foods and beverages you can eat. Contact a dietitian for more information. What foods should I avoid? Fruits Canned fruit in a light or heavy syrup. Fried fruit. Fruit in cream or butter sauce. Vegetables Creamed or fried vegetables. Vegetables in a cheese sauce. Regular canned vegetables (not low-sodium or reduced-sodium). Regular canned tomato sauce and paste (not low-sodium or reduced-sodium). Regular tomato and vegetable juice (not low-sodium or reduced-sodium). Pickles. Olives. Grains Baked goods made with fat, such as croissants, muffins, or some breads. Dry pasta or rice meal packs. Meats and other proteins Fatty cuts of meat. Ribs. Fried meat. Bacon. Bologna, salami, and other precooked or cured meats, such as sausages or meat loaves. Fat from the back of a pig (fatback).  Bratwurst. Salted nuts and seeds. Canned beans with added salt. Canned or smoked fish. Whole eggs or egg yolks. Chicken or turkey with skin. Dairy Whole or 2% milk, cream, and half-and-half. Whole or full-fat cream cheese. Whole-fat or sweetened yogurt. Full-fat cheese. Nondairy creamers. Whipped toppings. Processed cheese and cheese spreads. Fats and oils Butter. Stick margarine. Lard. Shortening. Ghee. Bacon fat. Tropical oils, such as coconut, palm kernel, or palm oil. Seasonings and condiments Onion salt, garlic salt, seasoned salt, table salt, and sea salt. Worcestershire sauce. Tartar sauce. Barbecue sauce. Teriyaki sauce. Soy sauce, including reduced-sodium. Steak sauce. Canned and packaged gravies. Fish sauce. Oyster sauce. Cocktail sauce. Store-bought horseradish. Ketchup. Mustard. Meat flavorings and tenderizers. Bouillon cubes. Hot sauces. Pre-made or packaged marinades. Pre-made or packaged taco seasonings. Relishes. Regular salad dressings. Other foods Salted popcorn and pretzels. The items listed above may not be a complete list of foods and beverages you should avoid. Contact a dietitian for more information. Where to find more information  National Heart, Lung, and Blood Institute: www.nhlbi.nih.gov  American Heart Association: www.heart.org  Academy of Nutrition and Dietetics: www.eatright.org  National Kidney Foundation: www.kidney.org Summary  The DASH eating plan is a healthy eating plan that has been shown to reduce high   blood pressure (hypertension). It may also reduce your risk for type 2 diabetes, heart disease, and stroke.  When on the DASH eating plan, aim to eat more fresh fruits and vegetables, whole grains, lean proteins, low-fat dairy, and heart-healthy fats.  With the DASH eating plan, you should limit salt (sodium) intake to 2,300 mg a day. If you have hypertension, you may need to reduce your sodium intake to 1,500 mg a day.  Work with your health care  provider or dietitian to adjust your eating plan to your individual calorie needs. This information is not intended to replace advice given to you by your health care provider. Make sure you discuss any questions you have with your health care provider. Document Revised: 06/04/2019 Document Reviewed: 06/04/2019 Elsevier Patient Education  2021 [employer].   

## 2020-10-17 NOTE — Progress Notes (Signed)
Patient ID: Janice Higgins, female    DOB: 1956/02/24, 65 y.o.   MRN: [id number]   Chief Complaint  Patient presents with  . Hypertension   Subjective:  CC: follow-up for blood pressure  This is a chronic problem.  Presents today to follow-up with blood pressure.  Was last seen for hypertension on December 1 by her PCP, Dr. Lovena Le, medication added at that time.  She monitors her blood pressure at home, reports that she gets slightly higher readings then today at the office.  Last lab work done March 28, will review in detail today.  No fever, no chills, no chest pain, no shortness of breath, no leg swelling, no headaches, no dizziness or lightheadedness.  Would also like a refill on diclofenac, saw Dr. Aline Brochure for her low back pain recently.   htn check up.   Would like to get refills on diclofenac.   Would like to know when she should stop robaxin.    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   Outpatient Encounter Medications as of 10/17/2020  Medication Sig  . Blood Pressure Monitoring (OMRON 3 SERIES BP MONITOR) DEVI USE AS DIRECTED  . Doxylamine Succinate, Sleep, (SLEEP AID PO) Take 1 tablet by mouth at bedtime as needed.  Marland Kitchen HYDROcodone-acetaminophen (NORCO/VICODIN) 5-325 MG tablet Take one tablet po qhs prn pain  . ibandronate (BONIVA) 3 MG/3ML SOLN injection Inject 3 mg into the vein every 3 (three) months.  . methocarbamol (ROBAXIN) 750 MG tablet TAKE 1 TABLET (750 MG TOTAL) BY MOUTH AT BEDTIME.  . pantoprazole (PROTONIX) 40 MG tablet TAKE 1 TABLET BY MOUTH DAILY  . predniSONE (DELTASONE) 20 MG tablet Take 2 tablets daily for  5 days  . [DISCONTINUED] acetaminophen (TYLENOL) 325 MG tablet Take 650 mg by mouth every 6 (six) hours as needed.  . [DISCONTINUED] albuterol (PROVENTIL HFA;VENTOLIN HFA) 108 (90 Base) MCG/ACT inhaler Inhale 2 puffs into the lungs every 4 (four) hours as needed.  . [DISCONTINUED] amLODipine (NORVASC) 5 MG tablet TAKE 1 TABLET (5 MG TOTAL) BY MOUTH DAILY.  . [DISCONTINUED] diclofenac (VOLTAREN) 75 MG EC tablet TAKE 1 TABLET (75 MG TOTAL) BY MOUTH 2 (TWO) TIMES DAILY WITH A MEAL.  . [DISCONTINUED] lisinopril (ZESTRIL) 5 MG tablet TAKE 1 TABLET BY MOUTH ONCE DAILY.  Marland Kitchen amLODipine (NORVASC) 5 MG tablet TAKE 1 TABLET (5 MG TOTAL) BY MOUTH DAILY.  Marland Kitchen diclofenac (VOLTAREN) 75 MG EC tablet TAKE 1 TABLET (75 MG TOTAL) BY MOUTH 2 (TWO) TIMES DAILY WITH A MEAL.  Marland Kitchen lisinopril (ZESTRIL) 5 MG tablet TAKE 1 TABLET BY MOUTH ONCE DAILY.   No facility-administered encounter medications on file as of 10/17/2020.     Review of Systems  Constitutional: Negative for chills and fever.  Eyes: Negative for visual disturbance.  Respiratory: Negative for shortness of breath.   Cardiovascular: Negative for chest pain and leg swelling.  Musculoskeletal: Positive for back pain.       Saw Dr. Aline Brochure recently.   Neurological: Negative for dizziness, light-headedness and headaches.     Vitals BP 109/72   Pulse 96   Temp (!) 97.3 F (36.3 C)   Ht 5\' 2"  (1.575 m)   Wt 187 lb (84.8 kg)   SpO2 94%  BMI 34.20 kg/m   Objective:   Physical Exam Vitals reviewed.  Constitutional:      Appearance: Normal appearance.  Cardiovascular:     Rate and Rhythm: Normal rate and regular rhythm.     Heart sounds: Normal heart sounds.  Pulmonary:     Effort: Pulmonary effort is normal.     Breath sounds: Normal breath sounds.  Skin:    General: Skin is warm and dry.  Neurological:     General: No focal deficit present.     Mental Status: She is alert.  Psychiatric:        Behavior: Behavior normal.       Assessment and Plan   1. Essential hypertension - amLODipine (NORVASC) 5 MG tablet; TAKE 1 TABLET (5 MG TOTAL) BY MOUTH DAILY.  Dispense: 90 tablet; Refill: 1 - lisinopril (ZESTRIL) 5 MG tablet; TAKE 1 TABLET BY MOUTH ONCE DAILY.  Dispense: 90 tablet; Refill: 1  2. Chronic low back pain, unspecified back pain laterality, unspecified whether sciatica present - diclofenac (VOLTAREN) 75 MG EC tablet; TAKE 1 TABLET (75 MG TOTAL) BY MOUTH 2 (TWO) TIMES DAILY WITH A MEAL.  Dispense: 90 tablet; Refill: 0   Hypertension Medication compliance: taking daily as prescribed.  Denies chest pain, shortness of breath, lower extremity edema, vision changes, headaches.  Pertinent lab work: 10/09/20: reviewed in detail today:  kidney function within normal limits, liver functions normal. Monitoring: every 6 months with labs. Side effects: none Continue current medication regimen: continue current medication regimen.   Kidney function normal, blood pressure well controlled, will renew diclofenac today.  Tolerates well without any issues with GI bleed.  Agrees with plan of care discussed today. Understands warning signs to seek further care: chest pain, shortness of breath, any significant change in health.  Understands to follow-up in 6 months for blood pressure.  We will continue to monitor blood pressure at home a few times per week, let us know if it becomes elevated.    Pecolia Ades, NP 10/17/2020

## 2020-10-24 ENCOUNTER — Other Ambulatory Visit (HOSPITAL_COMMUNITY): Payer: Self-pay

## 2020-10-27 ENCOUNTER — Other Ambulatory Visit (HOSPITAL_COMMUNITY): Payer: Self-pay

## 2020-10-27 ENCOUNTER — Telehealth: Payer: No Typology Code available for payment source | Admitting: Emergency Medicine

## 2020-10-27 DIAGNOSIS — R059 Cough, unspecified: Secondary | ICD-10-CM

## 2020-10-27 MED ORDER — PREDNISONE 20 MG PO TABS: ORAL_TABLET | ORAL | 0 refills | Status: DC

## 2020-10-27 MED ORDER — ALBUTEROL SULFATE HFA 108 (90 BASE) MCG/ACT IN AERS: 2.0000 | INHALATION_SPRAY | RESPIRATORY_TRACT | 3 refills | Status: DC | PRN

## 2020-10-27 NOTE — Progress Notes (Signed)
We are sorry that you are not feeling well.  Here is how we plan to help!  Based on your presentation I believe you most likely have A cough due to a virus.  This is called viral bronchitis and is best treated by rest, plenty of fluids and control of the cough.  You may use Ibuprofen or Tylenol as directed to help your symptoms.     I've called in a new inhaler for you along with some steroids.    From your responses in the eVisit questionnaire you describe inflammation in the upper respiratory tract which is causing a significant cough.  This is commonly called Bronchitis and has four common causes:    Allergies  Viral Infections  Acid Reflux  Bacterial Infection Allergies, viruses and acid reflux are treated by controlling symptoms or eliminating the cause. An example might be a cough caused by taking certain blood pressure medications. You stop the cough by changing the medication. Another example might be a cough caused by acid reflux. Controlling the reflux helps control the cough.  USE OF BRONCHODILATOR ("RESCUE") INHALERS: There is a risk from using your bronchodilator too frequently.  The risk is that over-reliance on a medication which only relaxes the muscles surrounding the breathing tubes can reduce the effectiveness of medications prescribed to reduce swelling and congestion of the tubes themselves.  Although you feel brief relief from the bronchodilator inhaler, your asthma may actually be worsening with the tubes becoming more swollen and filled with mucus.  This can delay other crucial treatments, such as oral steroid medications. If you need to use a bronchodilator inhaler daily, several times per day, you should discuss this with your provider.  There are probably better treatments that could be used to keep your asthma under control.     HOME CARE . Only take medications as instructed by your medical team. . Complete the entire course of an antibiotic. . Drink plenty of  fluids and get plenty of rest. . Avoid close contacts especially the very young and the elderly . Cover your mouth if you cough or cough into your sleeve. . Always remember to wash your hands . A steam or ultrasonic humidifier can help congestion.   GET HELP RIGHT AWAY IF: . You develop worsening fever. . You become short of breath . You cough up blood. . Your symptoms persist after you have completed your treatment plan MAKE SURE YOU   Understand these instructions.  Will watch your condition.  Will get help right away if you are not doing well or get worse.  Your e-visit answers were reviewed by a board certified advanced clinical practitioner to complete your personal care plan.  Depending on the condition, your plan could have included both over the counter or prescription medications. If there is a problem please reply  once you have received a response from your provider. Your safety is important to [place].  If you have drug allergies check your prescription carefully.    You can use MyChart to ask questions about today's visit, request a non-urgent call back, or ask for a work or school excuse for 24 hours related to this e-Visit. If it has been greater than 24 hours you will need to follow up with your provider, or enter a new e-Visit to address those concerns. You will get an e-mail in the next two days asking about your experience.  I hope that your e-visit has been valuable and will speed your recovery. Thank  you for using e-visits.  Approximately 5 minutes was used in reviewing the patient's chart, questionnaire, prescribing medications, and documentation.

## 2020-10-30 ENCOUNTER — Other Ambulatory Visit (HOSPITAL_COMMUNITY): Payer: Self-pay

## 2020-10-30 MED FILL — Pantoprazole Sodium EC Tab 40 MG (Base Equiv): ORAL | 30 days supply | Qty: 30 | Fill #0 | Status: AC

## 2020-10-31 ENCOUNTER — Other Ambulatory Visit (HOSPITAL_COMMUNITY): Payer: Self-pay

## 2020-11-27 ENCOUNTER — Other Ambulatory Visit (HOSPITAL_COMMUNITY): Payer: Self-pay

## 2020-11-27 MED FILL — Methocarbamol Tab 750 MG: ORAL | 30 days supply | Qty: 30 | Fill #0 | Status: AC

## 2020-11-27 MED FILL — Pantoprazole Sodium EC Tab 40 MG (Base Equiv): ORAL | 30 days supply | Qty: 30 | Fill #1 | Status: AC

## 2020-12-01 ENCOUNTER — Other Ambulatory Visit (HOSPITAL_COMMUNITY): Payer: Self-pay | Admitting: Family Medicine

## 2020-12-01 ENCOUNTER — Ambulatory Visit (HOSPITAL_COMMUNITY)
Admission: RE | Admit: 2020-12-01 | Discharge: 2020-12-01 | Disposition: A | Discharge status: Home / Self Care | Payer: No Typology Code available for payment source | Source: Ambulatory Visit | Attending: Family Medicine | Admitting: Family Medicine

## 2020-12-01 ENCOUNTER — Other Ambulatory Visit (HOSPITAL_COMMUNITY): Payer: Self-pay

## 2020-12-01 ENCOUNTER — Encounter: Payer: Self-pay | Admitting: Family Medicine

## 2020-12-01 ENCOUNTER — Ambulatory Visit (INDEPENDENT_AMBULATORY_CARE_PROVIDER_SITE_OTHER): Payer: No Typology Code available for payment source | Admitting: Family Medicine

## 2020-12-01 ENCOUNTER — Other Ambulatory Visit: Payer: Self-pay

## 2020-12-01 VITALS — BP 138/88 | HR 85 | Temp 97.4°F | Ht 62.0 in | Wt 188.4 lb

## 2020-12-01 DIAGNOSIS — R0781 Pleurodynia: Secondary | ICD-10-CM | POA: Insufficient documentation

## 2020-12-01 DIAGNOSIS — G8929 Other chronic pain: Secondary | ICD-10-CM

## 2020-12-01 DIAGNOSIS — M545 Low back pain, unspecified: Secondary | ICD-10-CM | POA: Diagnosis not present

## 2020-12-01 DIAGNOSIS — R0989 Other specified symptoms and signs involving the circulatory and respiratory systems: Secondary | ICD-10-CM

## 2020-12-01 DIAGNOSIS — Z1231 Encounter for screening mammogram for malignant neoplasm of breast: Secondary | ICD-10-CM

## 2020-12-01 MED ORDER — HYDROCODONE-ACETAMINOPHEN 5-325 MG PO TABS
1.0000 | ORAL_TABLET | Freq: Every evening | ORAL | 0 refills | Status: DC | PRN
  Filled 2020-12-01: qty 30, 30d supply, fill #0

## 2020-12-01 NOTE — Progress Notes (Signed)
Patient ID: Janice Higgins, female    DOB: [birth year], 65 y.o.   MRN: [id number]   Chief Complaint  Patient presents with  . Back Pain    Rib pain after coughing/bronchitis - hurts with movement and cough   Subjective:  CC: back pain, thinks she has cracked a rib  This is a new problem.  Presents today for an acute visit with a complaint of back pain, "think I cracked a rib ".  Reports that she got very sick over Easter, did an E-visit on 10/27/20 was given inhaler and prednisone.  Reports that she is continuing to have a dry hacking cough, pain on the right side of her back, and rales.  Denies fever, chills.  Endorses extreme fatigue and low energy level, worse than usual.  Reports shortness of breath with exertion, associates this with weight gain.See review of systems.     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   Outpatient Encounter Medications as of 12/01/2020  Medication Sig  . albuterol (VENTOLIN HFA) 108 (90 Base) MCG/ACT inhaler Inhale 2 puffs into the lungs every 4 (four) hours as needed for wheezing or shortness of breath.  Marland Kitchen amLODipine (NORVASC) 5 MG tablet TAKE 1 TABLET (5 MG TOTAL) BY MOUTH DAILY.  Marland Kitchen Blood Pressure Monitoring (OMRON 3 SERIES BP MONITOR) DEVI USE AS DIRECTED  . diclofenac (VOLTAREN) 75 MG EC tablet TAKE 1 TABLET (75 MG TOTAL) BY MOUTH 2 (TWO) TIMES DAILY WITH A MEAL.  Marland Kitchen Doxylamine Succinate, Sleep, (SLEEP AID PO) Take 1 tablet by mouth at bedtime as needed.  Marland Kitchen HYDROcodone-acetaminophen (NORCO/VICODIN) 5-325 MG tablet Take one tablet po qhs prn pain  . ibandronate (BONIVA) 3 MG/3ML SOLN injection Inject 3 mg into the vein every 3  (three) months.  Marland Kitchen lisinopril (ZESTRIL) 5 MG tablet TAKE 1 TABLET BY MOUTH ONCE DAILY.  . methocarbamol (ROBAXIN) 750 MG tablet TAKE 1 TABLET (750 MG TOTAL) BY MOUTH AT BEDTIME.  . pantoprazole (PROTONIX) 40 MG tablet TAKE 1 TABLET BY MOUTH DAILY  . predniSONE (DELTASONE) 20 MG tablet Take 2 tablets daily for 5 days  . [DISCONTINUED] HYDROcodone-acetaminophen (NORCO/VICODIN) 5-325 MG tablet Take one tablet po qhs prn pain   No facility-administered encounter medications on file as of 12/01/2020.     Review of Systems  Constitutional: Positive for activity change (exhausted ) and fatigue. Negative for appetite change, chills and fever.  Respiratory: Positive for cough (occassionally ) and shortness of breath (with exertion).   Cardiovascular: Negative for chest pain.  Musculoskeletal: Positive for back pain.     Vitals BP 138/88 Comment: havent take BP meds this am  Pulse 85   Temp (!) 97.4 F (36.3 C) (Oral)   Ht 5\' 2"  (1.575 m)   Wt 188 lb 6.4 oz (85.5 kg)   SpO2 96%   BMI 34.46 kg/m   Objective:   Physical Exam Vitals reviewed.  Constitutional:      Appearance: Normal appearance.  Cardiovascular:     Rate and Rhythm: Normal rate and regular rhythm.     Heart sounds: Normal heart sounds.  Pulmonary:     Effort: Pulmonary effort is normal.     Breath sounds: Rales present.  Skin:    General: Skin  is warm and dry.  Neurological:     General: No focal deficit present.     Mental Status: She is alert.  Psychiatric:        Behavior: Behavior normal.      Assessment and Plan   1. Class 3 severe obesity in adult, unspecified BMI, unspecified obesity type, unspecified whether serious comorbidity present ([place]) - Amb Ref to Medical Weight Management  2. Rib pain on right side - CBC with Differential/Platelet - DG Chest 2 View - HYDROcodone-acetaminophen (NORCO/VICODIN) 5-325 MG tablet; Take one tablet po qhs prn pain  Dispense: 30 tablet; Refill: 0  3. Bilateral  rales - CBC with Differential/Platelet - DG Chest 2 View  4. Chronic low back pain, unspecified back pain laterality, unspecified whether sciatica present - HYDROcodone-acetaminophen (NORCO/VICODIN) 5-325 MG tablet; Take one tablet po qhs prn pain  Dispense: 30 tablet; Refill: 0   Due to right-sided rib pain and bilateral rales, will send for stat chest x-ray to rule out pneumonia.  She will get CBC on the way out today.  This information will be sent to her PCP, Dr. Lovena Le for evaluation later today and  treatment if necessary.  History of chronic back pain, in which she gets hydrocodone 30 tablets every 5 to 6 months, refill sent today as this is causing her extreme pain.  Usually takes 1/2 to 1 tablet once per day if needed.  Agrees with plan of care discussed today. Understands warning signs to seek further care: chest pain, shortness of breath, any significant change in health.  Understands to follow-up in 2 weeks with PCP for rib pain and rales. Concerned over weight gain- referral sent for weight loss clinic.   Pecolia Ades, NP 12/01/20

## 2020-12-02 LAB — CBC WITH DIFFERENTIAL/PLATELET
Basophils Absolute: 0.1 10*3/uL (ref 0.0–0.2)
Basos: 1 %
EOS (ABSOLUTE): 0.4 10*3/uL (ref 0.0–0.4)
Eos: 5 %
Hematocrit: 42.7 % (ref 34.0–46.6)
Hemoglobin: 14.6 g/dL (ref 11.1–15.9)
Immature Grans (Abs): 0 10*3/uL (ref 0.0–0.1)
Immature Granulocytes: 0 %
Lymphocytes Absolute: 2.6 10*3/uL (ref 0.7–3.1)
Lymphs: 35 %
MCH: 30.6 pg (ref 26.6–33.0)
MCHC: 34.2 g/dL (ref 31.5–35.7)
MCV: 90 fL (ref 79–97)
Monocytes Absolute: 0.6 10*3/uL (ref 0.1–0.9)
Monocytes: 8 %
Neutrophils Absolute: 3.8 10*3/uL (ref 1.4–7.0)
Neutrophils: 51 %
Platelets: 354 10*3/uL (ref 150–450)
RBC: 4.77 x10E6/uL (ref 3.77–5.28)
RDW: 12.8 % (ref 11.7–15.4)
WBC: 7.5 10*3/uL (ref 3.4–10.8)

## 2020-12-04 ENCOUNTER — Telehealth: Payer: Self-pay

## 2020-12-04 NOTE — Telephone Encounter (Signed)
Janice Higgins returned Janice Higgins phone call

## 2020-12-04 NOTE — Telephone Encounter (Signed)
See result note.  

## 2020-12-06 ENCOUNTER — Other Ambulatory Visit: Payer: Self-pay

## 2020-12-06 ENCOUNTER — Ambulatory Visit (INDEPENDENT_AMBULATORY_CARE_PROVIDER_SITE_OTHER): Payer: No Typology Code available for payment source | Admitting: Family Medicine

## 2020-12-06 VITALS — BP 138/79 | HR 96 | Temp 96.6°F | Ht 62.0 in | Wt 189.0 lb

## 2020-12-06 DIAGNOSIS — M545 Low back pain, unspecified: Secondary | ICD-10-CM

## 2020-12-06 DIAGNOSIS — R0781 Pleurodynia: Secondary | ICD-10-CM | POA: Diagnosis not present

## 2020-12-06 DIAGNOSIS — S22000A Wedge compression fracture of unspecified thoracic vertebra, initial encounter for closed fracture: Secondary | ICD-10-CM

## 2020-12-06 DIAGNOSIS — G8929 Other chronic pain: Secondary | ICD-10-CM

## 2020-12-06 NOTE — Patient Instructions (Signed)
  Take vit d and calcium.  1200 mg of elemental calcium daily, and 800 international units of vitamin D daily are advised.

## 2020-12-06 NOTE — Progress Notes (Signed)
Patient ID: Janice Higgins, female    DOB: Dec 01, 1955, 65 y.o.   MRN: [id number]   Chief Complaint  Patient presents with  . follow up mid back pain     Right side rib pain    Subjective:    HPI Rib pain follow up - rt posterior rib pain.  Has had h/o compression fractures in her throacic back from osteoporosis in 2020. From xray- compression deformity several vertebrate from T6-T8, from 2020.   recent xray on 12/01/20- showing compression fx, age indeterminate.  Still showing osteopenia.   Pain now on rt lateral rib posterior. Seen on 12/01/20 and had coughing and   Near Easter had a virtual visit for URI and using inhaler and was expired and given prednisone and inhaler.  Didn't get abx, then it improved on it's own.  Dry cough-intermittent now.   Feels pain at night with laying down on the side and also not taking a deep breath due to the pain in rt posterior rib area. Has some pain with deep inspiration.  No hempoptysis.  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   Outpatient Encounter Medications as of 12/06/2020  Medication Sig  . albuterol (VENTOLIN HFA) 108 (90 Base) MCG/ACT inhaler Inhale 2 puffs into the lungs every 4 (four) hours as needed for wheezing or shortness of breath.  Marland Kitchen amLODipine (NORVASC) 5 MG tablet TAKE 1 TABLET (5 MG TOTAL) BY MOUTH DAILY.  Marland Kitchen Blood Pressure Monitoring (OMRON 3 SERIES BP MONITOR) DEVI USE AS DIRECTED  . diclofenac (VOLTAREN) 75 MG EC tablet TAKE 1 TABLET (75 MG TOTAL) BY MOUTH 2 (TWO) TIMES DAILY WITH A MEAL.  Marland Kitchen Doxylamine Succinate, Sleep, (SLEEP AID PO) Take 1 tablet by mouth at bedtime as needed.  Marland Kitchen  HYDROcodone-acetaminophen (NORCO/VICODIN) 5-325 MG tablet Take 1 tablet by mouth at bedtime as needed for pain  . ibandronate (BONIVA) 3 MG/3ML SOLN injection Inject 3 mg into the vein every 3 (three) months.  Marland Kitchen lisinopril (ZESTRIL) 5 MG tablet TAKE 1 TABLET BY MOUTH ONCE DAILY.  . methocarbamol (ROBAXIN) 750 MG tablet TAKE 1 TABLET (750 MG TOTAL) BY MOUTH AT BEDTIME.  . pantoprazole (PROTONIX) 40 MG tablet TAKE 1 TABLET BY MOUTH DAILY  . [DISCONTINUED] predniSONE (DELTASONE) 20 MG tablet Take 2 tablets daily for 5 days   No facility-administered encounter medications on file as of 12/06/2020.     Review of Systems  Constitutional: Negative for chills and fever.  HENT: Negative for congestion, rhinorrhea and sore throat.   Respiratory: Negative for cough, shortness of breath and wheezing.   Cardiovascular: Negative for chest pain and leg swelling.  Gastrointestinal: Negative for abdominal pain, diarrhea, nausea and vomiting.  Genitourinary: Negative for dysuria and frequency.  Musculoskeletal: Positive for back pain (rt lateral rib pain, thoracic). Negative for arthralgias.  Skin: Negative for rash.  Neurological: Negative for dizziness, weakness and headaches.     Vitals BP 138/79   Pulse 96   Temp (!) 96.6 F (35.9 C)   Ht 5\' 2"  (1.575 m)   Wt 189 lb (85.7 kg)   SpO2 99%   BMI 34.57 kg/m   Objective:   Physical Exam Vitals and nursing note reviewed.  Constitutional:  Appearance: Normal appearance.  HENT:     Head: Normocephalic and atraumatic.     Nose: Nose normal.     Mouth/Throat:     Mouth: Mucous membranes are moist.     Pharynx: Oropharynx is clear.  Eyes:     Extraocular Movements: Extraocular movements intact.     Conjunctiva/sclera: Conjunctivae normal.     Pupils: Pupils are equal, round, and reactive to light.  Cardiovascular:     Rate and Rhythm: Normal rate and regular rhythm.     Pulses: Normal pulses.     Heart sounds: Normal heart sounds.   Pulmonary:     Effort: Pulmonary effort is normal.     Breath sounds: Normal breath sounds. No wheezing, rhonchi or rales.  Musculoskeletal:        General: Tenderness (rt posterior rib) present. Normal range of motion.     Right lower leg: No edema.     Left lower leg: No edema.     Comments: +ttp over rt lateral rib around rib 7-8 posteriorly.  No ttp over thoracic spines.  Dec rom with flexion.   Skin:    General: Skin is warm and dry.     Findings: No lesion or rash.  Neurological:     General: No focal deficit present.     Mental Status: She is alert and oriented to person, place, and time.  Psychiatric:        Mood and Affect: Mood normal.        Behavior: Behavior normal.    Assessment and Plan   1. Compression fracture of body of thoracic vertebra [facility]) - Ambulatory referral to Neurosurgery  2. Rib pain on right side - DG Ribs Unilateral Right; Future  3. Chronic low back pain, unspecified back pain laterality, unspecified whether sciatica present    Pt stating not needing pain meds refilled at this time, was just filled on 12/01/20. Not needing refill at this time of norco received on last visit.  Cont with otc pain patch on rib. Cont with diclofenac during the day.  Osteopenia- cont with  boniva.  Cont vit d and calcium. Reviewed needing to take 1200mg  calcium and 800IU of vit D daily.  Referred to neurosurgery for concern of compression fx, and if these are new and if needing further treatment. Reviewed xray.   Return if symptoms worsen or fail to improve.   12/08/2020

## 2020-12-08 ENCOUNTER — Other Ambulatory Visit (HOSPITAL_COMMUNITY): Payer: Self-pay

## 2020-12-12 ENCOUNTER — Telehealth: Payer: Self-pay

## 2020-12-12 MED ORDER — CHERATUSSIN AC 100-10 MG/5ML PO SOLN: 5.0000 mL | Freq: Three times a day (TID) | ORAL | 0 refills | Status: DC | PRN

## 2020-12-12 NOTE — Telephone Encounter (Signed)
Patient notified

## 2020-12-12 NOTE — Telephone Encounter (Signed)
Tested positive for Covid and coughing real bad can something be called in [place] in [place]   Call back [phone]

## 2020-12-15 ENCOUNTER — Ambulatory Visit: Payer: No Typology Code available for payment source | Admitting: Family Medicine

## 2020-12-18 ENCOUNTER — Other Ambulatory Visit: Payer: Self-pay

## 2020-12-18 ENCOUNTER — Ambulatory Visit (HOSPITAL_COMMUNITY)
Admission: RE | Admit: 2020-12-18 | Discharge: 2020-12-18 | Disposition: A | Discharge status: Home / Self Care | Payer: No Typology Code available for payment source | Source: Ambulatory Visit | Attending: Family Medicine | Admitting: Family Medicine

## 2020-12-18 DIAGNOSIS — Z1231 Encounter for screening mammogram for malignant neoplasm of breast: Secondary | ICD-10-CM | POA: Insufficient documentation

## 2020-12-22 ENCOUNTER — Encounter (HOSPITAL_COMMUNITY): Payer: No Typology Code available for payment source

## 2020-12-26 ENCOUNTER — Encounter (HOSPITAL_COMMUNITY): Payer: No Typology Code available for payment source

## 2020-12-27 ENCOUNTER — Other Ambulatory Visit: Payer: Self-pay | Admitting: Family Medicine

## 2020-12-27 ENCOUNTER — Other Ambulatory Visit (HOSPITAL_COMMUNITY): Payer: Self-pay

## 2020-12-28 ENCOUNTER — Other Ambulatory Visit (HOSPITAL_COMMUNITY): Payer: Self-pay

## 2020-12-28 ENCOUNTER — Encounter (HOSPITAL_COMMUNITY)
Admission: RE | Admit: 2020-12-28 | Discharge: 2020-12-28 | Disposition: A | Discharge status: Home / Self Care | Payer: No Typology Code available for payment source | Source: Ambulatory Visit | Attending: Family Medicine | Admitting: Family Medicine

## 2020-12-28 ENCOUNTER — Encounter (HOSPITAL_COMMUNITY): Payer: Self-pay

## 2020-12-28 ENCOUNTER — Other Ambulatory Visit: Payer: Self-pay

## 2020-12-28 DIAGNOSIS — M810 Age-related osteoporosis without current pathological fracture: Secondary | ICD-10-CM | POA: Insufficient documentation

## 2020-12-28 MED ORDER — PANTOPRAZOLE SODIUM 40 MG PO TBEC
40.0000 mg | DELAYED_RELEASE_TABLET | Freq: Every day | ORAL | 2 refills | Status: DC
  Filled 2020-12-28: qty 90, 90d supply, fill #0

## 2020-12-28 MED ORDER — IBANDRONATE SODIUM 3 MG/3ML IV SOLN: 3.0000 mg | Freq: Once | INTRAVENOUS | Status: AC

## 2020-12-28 MED ORDER — IBANDRONATE SODIUM 3 MG/3ML IV SOLN
INTRAVENOUS | Status: AC
  Administered 2020-12-28: 3 mg via INTRAVENOUS
  Filled 2020-12-28: qty 3

## 2020-12-29 ENCOUNTER — Other Ambulatory Visit (HOSPITAL_COMMUNITY): Payer: Self-pay | Admitting: Neurosurgery

## 2020-12-29 ENCOUNTER — Other Ambulatory Visit: Payer: Self-pay | Admitting: Neurosurgery

## 2020-12-29 DIAGNOSIS — S22050A Wedge compression fracture of T5-T6 vertebra, initial encounter for closed fracture: Secondary | ICD-10-CM

## 2021-01-01 ENCOUNTER — Other Ambulatory Visit: Payer: Self-pay

## 2021-01-01 ENCOUNTER — Ambulatory Visit (HOSPITAL_COMMUNITY)
Admission: RE | Admit: 2021-01-01 | Discharge: 2021-01-01 | Disposition: A | Discharge status: Home / Self Care | Payer: No Typology Code available for payment source | Source: Ambulatory Visit | Attending: Neurosurgery | Admitting: Neurosurgery

## 2021-01-01 DIAGNOSIS — S22050A Wedge compression fracture of T5-T6 vertebra, initial encounter for closed fracture: Secondary | ICD-10-CM | POA: Insufficient documentation

## 2021-01-04 ENCOUNTER — Encounter (INDEPENDENT_AMBULATORY_CARE_PROVIDER_SITE_OTHER): Payer: Self-pay

## 2021-01-12 ENCOUNTER — Other Ambulatory Visit (HOSPITAL_COMMUNITY): Payer: Self-pay

## 2021-01-12 ENCOUNTER — Ambulatory Visit (INDEPENDENT_AMBULATORY_CARE_PROVIDER_SITE_OTHER): Payer: No Typology Code available for payment source | Admitting: Family Medicine

## 2021-01-12 ENCOUNTER — Other Ambulatory Visit: Payer: Self-pay

## 2021-01-12 ENCOUNTER — Encounter: Payer: Self-pay | Admitting: Family Medicine

## 2021-01-12 VITALS — BP 104/72 | HR 102 | Temp 97.2°F | Ht 62.0 in | Wt 188.0 lb

## 2021-01-12 DIAGNOSIS — M545 Low back pain, unspecified: Secondary | ICD-10-CM | POA: Diagnosis not present

## 2021-01-12 DIAGNOSIS — K219 Gastro-esophageal reflux disease without esophagitis: Secondary | ICD-10-CM

## 2021-01-12 DIAGNOSIS — Z0001 Encounter for general adult medical examination with abnormal findings: Secondary | ICD-10-CM | POA: Diagnosis not present

## 2021-01-12 DIAGNOSIS — N3001 Acute cystitis with hematuria: Secondary | ICD-10-CM

## 2021-01-12 DIAGNOSIS — S22060D Wedge compression fracture of T7-T8 vertebra, subsequent encounter for fracture with routine healing: Secondary | ICD-10-CM

## 2021-01-12 DIAGNOSIS — I10 Essential (primary) hypertension: Secondary | ICD-10-CM | POA: Diagnosis not present

## 2021-01-12 DIAGNOSIS — R0781 Pleurodynia: Secondary | ICD-10-CM

## 2021-01-12 DIAGNOSIS — Z0000 Encounter for general adult medical examination without abnormal findings: Secondary | ICD-10-CM

## 2021-01-12 DIAGNOSIS — G8929 Other chronic pain: Secondary | ICD-10-CM

## 2021-01-12 LAB — POCT URINALYSIS DIPSTICK
Blood, UA: POSITIVE
Spec Grav, UA: 1.015 (ref 1.010–1.025)
pH, UA: 7.5 (ref 5.0–8.0)

## 2021-01-12 MED ORDER — AMLODIPINE BESYLATE 5 MG PO TABS
5.0000 mg | ORAL_TABLET | Freq: Every day | ORAL | 1 refills | Status: DC
  Filled 2021-01-12: qty 90, fill #0
  Filled 2021-03-09: qty 90, 90d supply, fill #0
  Filled 2021-06-08: qty 90, 90d supply, fill #1

## 2021-01-12 MED ORDER — HYDROCODONE-ACETAMINOPHEN 5-325 MG PO TABS
1.0000 | ORAL_TABLET | Freq: Every evening | ORAL | 0 refills | Status: DC | PRN
  Filled 2021-01-12: qty 45, 45d supply, fill #0

## 2021-01-12 MED ORDER — DICLOFENAC SODIUM 75 MG PO TBEC
75.0000 mg | DELAYED_RELEASE_TABLET | Freq: Two times a day (BID) | ORAL | 0 refills | Status: DC
  Filled 2021-01-12: qty 90, 45d supply, fill #0

## 2021-01-12 MED ORDER — LISINOPRIL 5 MG PO TABS
5.0000 mg | ORAL_TABLET | Freq: Every day | ORAL | 1 refills | Status: DC
  Filled 2021-01-12: qty 90, 90d supply, fill #0
  Filled 2021-04-25: qty 90, 90d supply, fill #1

## 2021-01-12 MED ORDER — FAMOTIDINE 40 MG PO TABS
40.0000 mg | ORAL_TABLET | Freq: Every day | ORAL | 5 refills | Status: DC
  Filled 2021-01-12: qty 30, 30d supply, fill #0
  Filled 2021-02-13: qty 30, 30d supply, fill #1
  Filled 2021-03-14: qty 30, 30d supply, fill #2
  Filled 2021-04-16: qty 30, 30d supply, fill #3
  Filled 2021-05-14: qty 30, 30d supply, fill #4
  Filled 2021-06-18: qty 30, 30d supply, fill #5

## 2021-01-12 MED ORDER — CEPHALEXIN 500 MG PO CAPS
500.0000 mg | ORAL_CAPSULE | Freq: Two times a day (BID) | ORAL | 0 refills | Status: DC
  Filled 2021-01-12: qty 14, 7d supply, fill #0

## 2021-01-12 MED ORDER — PANTOPRAZOLE SODIUM 40 MG PO TBEC
40.0000 mg | DELAYED_RELEASE_TABLET | Freq: Every day | ORAL | 1 refills | Status: DC
  Filled 2021-01-12 – 2021-04-03 (×2): qty 90, 90d supply, fill #0
  Filled 2021-07-03: qty 90, 90d supply, fill #1

## 2021-01-12 NOTE — Progress Notes (Signed)
Patient ID: Janice Higgins, female    DOB: 1956-01-17, 65 y.o.   MRN: [id number]   Chief Complaint  Patient presents with   Annual Exam   Subjective:    HPI   The patient comes in today for a wellness visit.  A review of their health history was completed.  A review of medications was also completed.  Any needed refills;   Eating habits: health conscious  Falls/  MVA accidents in past few months: none  Regular exercise: none  Specialist pt sees on regular basis: none  Preventative health issues were discussed.   Additional concerns: burning with urination for one week.  Burning with urination.  Seeing neurosurgeon- has multiple T6, T7, T8- compression fx. Didn't do rib xray and then instead have the MRI Seeing neurosurgeon in follow up next week. Was taking boniva for 2 yrs and still having new fractures.  Possibly may need prolia due to additional fx. Seeing Dr. Marcello Moores   Pt doing well, except having some pain with urination. Also went to neurosurgery and has T7-T9 compression fx. And is being tx for osteoporosis and on boniva. Pt taking norco 5mg  prn for pain, stating is controlled with this. Will see ns again next week to review MRI thoracic.  Feeling has inc in weight gain. Feels  eating healthty, due to pain not able to move around like she used to.  Rt ankle swelling. Having reflux and tomatoes and bad taste in mouth.  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   Outpatient Encounter Medications as of 01/12/2021  Medication Sig   cephALEXin (KEFLEX) 500 MG capsule Take 1 capsule (500 mg total) by mouth 2 (two) times daily.    famotidine (PEPCID) 40 MG tablet Take 1 tablet (40 mg total) by mouth daily.   albuterol (VENTOLIN HFA) 108 (90 Base) MCG/ACT inhaler Inhale 2 puffs into the lungs every 4 (four) hours as needed for wheezing or shortness of breath.   amLODipine (NORVASC) 5 MG tablet Take 1 tablet (5 mg total) by mouth daily.   Blood Pressure Monitoring (OMRON 3 SERIES BP MONITOR) DEVI USE AS DIRECTED   diclofenac (VOLTAREN) 75 MG EC tablet Take 1 tablet (75 mg total) by mouth 2 (two) times daily with a meal   Doxylamine Succinate, Sleep, (SLEEP AID PO) Take 1 tablet by mouth at bedtime as needed.   guaiFENesin-codeine (CHERATUSSIN AC) 100-10 MG/5ML syrup Take 5 mLs by mouth 3 (three) times daily as needed for cough.   HYDROcodone-acetaminophen (NORCO/VICODIN) 5-325 MG tablet Take 1 tablet by mouth at bedtime as needed for pain   ibandronate (BONIVA) 3 MG/3ML SOLN injection Inject 3 mg into the vein every 3 (three) months.   lisinopril (ZESTRIL) 5 MG tablet Take 1 tablet (5 mg total) by mouth daily.   methocarbamol (ROBAXIN) 750 MG tablet TAKE 1 TABLET (750 MG TOTAL) BY MOUTH AT BEDTIME.   pantoprazole (PROTONIX) 40 MG tablet Take 1 tablet (40 mg total) by mouth daily.   [DISCONTINUED] amLODipine (NORVASC) 5 MG tablet TAKE 1 TABLET (5 MG TOTAL) BY MOUTH DAILY.   [DISCONTINUED] diclofenac (VOLTAREN) 75 MG EC tablet TAKE 1 TABLET (75 MG TOTAL) BY MOUTH 2 (TWO) TIMES DAILY WITH A MEAL.   [DISCONTINUED] HYDROcodone-acetaminophen (  NORCO/VICODIN) 5-325 MG tablet Take 1 tablet by mouth at bedtime as needed for pain   [DISCONTINUED] lisinopril (ZESTRIL) 5 MG tablet TAKE 1 TABLET BY MOUTH ONCE DAILY.   [DISCONTINUED] pantoprazole (PROTONIX) 40 MG tablet Take 1 tablet (40 mg total) by mouth daily.   No facility-administered encounter medications on file as of 01/12/2021.     Review of Systems  Constitutional:  Negative for chills and fever.  HENT:  Negative for congestion, rhinorrhea and sore throat.   Respiratory:   Negative for cough, shortness of breath and wheezing.   Cardiovascular:  Negative for chest pain and leg swelling.  Gastrointestinal:  Negative for abdominal pain, diarrhea, nausea and vomiting.  Genitourinary:  Positive for dysuria. Negative for frequency.  Musculoskeletal:  Positive for back pain (mid back). Negative for arthralgias.  Skin:  Negative for rash.  Neurological:  Negative for dizziness, weakness and headaches.    Vitals BP 104/72   Pulse (!) 102   Temp (!) 97.2 F (36.2 C)   Ht 5\' 2"  (1.575 m)   Wt 188 lb (85.3 kg)   SpO2 96%   BMI 34.39 kg/m   Objective:   Physical Exam Vitals and nursing note reviewed.  Constitutional:      Appearance: Normal appearance.  HENT:     Head: Normocephalic and atraumatic.     Nose: Nose normal.     Mouth/Throat:     Mouth: Mucous membranes are moist.     Pharynx: Oropharynx is clear.  Eyes:     Extraocular Movements: Extraocular movements intact.     Conjunctiva/sclera: Conjunctivae normal.     Pupils: Pupils are equal, round, and reactive to light.  Cardiovascular:     Rate and Rhythm: Normal rate and regular rhythm.     Pulses: Normal pulses.     Heart sounds: Normal heart sounds. No murmur heard. Pulmonary:     Effort: Pulmonary effort is normal.     Breath sounds: Normal breath sounds. No wheezing, rhonchi or rales.  Musculoskeletal:        General: Normal range of motion.     Cervical back: Normal range of motion.     Right lower leg: No edema.     Left lower leg: No edema.  Skin:    General: Skin is warm and dry.     Findings: No lesion or rash.  Neurological:     General: No focal deficit present.     Mental Status: She is alert and oriented to person, place, and time.  Psychiatric:        Mood and Affect: Mood normal.        Behavior: Behavior normal.   Pt declining pelvic and breast exam.  Assessment and Plan   1. Routine general medical examination at a health care facility - famotidine (PEPCID) 40  MG tablet; Take 1 tablet (40 mg total) by mouth daily.  Dispense: 30 tablet; Refill: 5 - pantoprazole (PROTONIX) 40 MG tablet; Take 1 tablet (40 mg total) by mouth daily.  Dispense: 90 tablet; Refill: 1  2. Acute cystitis with hematuria - POCT urinalysis dipstick - cephALEXin (KEFLEX) 500 MG capsule; Take 1 capsule (500 mg total) by mouth 2 (two) times daily.  Dispense: 14 capsule; Refill: 0 - Urine Culture - Specimen status report  3. Rib pain on right side - HYDROcodone-acetaminophen (NORCO/VICODIN) 5-325 MG tablet; Take 1 tablet by mouth at bedtime as needed for pain  Dispense: 45 tablet; Refill: 0  4. Chronic low  back pain, unspecified back pain laterality, unspecified whether sciatica present - diclofenac (VOLTAREN) 75 MG EC tablet; Take 1 tablet (75 mg total) by mouth 2 (two) times daily with a meal  Dispense: 90 tablet; Refill: 0  5. Essential hypertension - lisinopril (ZESTRIL) 5 MG tablet; Take 1 tablet (5 mg total) by mouth daily.  Dispense: 90 tablet; Refill: 1 - amLODipine (NORVASC) 5 MG tablet; Take 1 tablet (5 mg total) by mouth daily.  Dispense: 90 tablet; Refill: 1  6. Compression fracture of T8 vertebra with routine healing, subsequent encounter - HYDROcodone-acetaminophen (NORCO/VICODIN) 5-325 MG tablet; Take 1 tablet by mouth at bedtime as needed for pain  Dispense: 45 tablet; Refill: 0  7. Gastroesophageal reflux disease without esophagitis   Osteoporosis, rib pain, thoracic fx- stable. Chronic pain. Pt having rt posterior rib pain and new and chronic thoracic compression fx with h/o osteoporosis.- will give norco and pt to f/u with neurosurgeon.  Htn- stable. Cont meds. -  UTI- will trx with keflex, and send for urine.  Gerd- stable. Will cont meds.   Return in about 6 months (around 07/15/2021) for f/u htn, osteoporosis, .  Results for orders placed or performed in visit on 01/12/21  Urine Culture   Specimen: Urine   Urine  Result Value Ref Range   Urine  Culture, Routine Final report    Organism ID, Bacteria Comment   Specimen status report  Result Value Ref Range   specimen status report Comment   POCT urinalysis dipstick  Result Value Ref Range   Color, UA     Clarity, UA     Glucose, UA     Bilirubin, UA     Ketones, UA     Spec Grav, UA 1.015 1.010 - 1.025   Blood, UA positive    pH, UA 7.5 5.0 - 8.0   Protein, UA     Urobilinogen, UA     Nitrite, UA     Leukocytes, UA     Appearance     Odor         Vitals:   01/12/21 0919  BP: 104/72  Pulse: (!) 102  Temp: (!) 97.2 F (36.2 C)  SpO2: 96%

## 2021-01-16 LAB — SPECIMEN STATUS REPORT

## 2021-01-16 LAB — URINE CULTURE

## 2021-01-26 ENCOUNTER — Other Ambulatory Visit: Payer: Self-pay | Admitting: Family Medicine

## 2021-01-26 MED ORDER — FLUCONAZOLE 150 MG PO TABS: ORAL_TABLET | ORAL | 0 refills | Status: DC

## 2021-01-28 DIAGNOSIS — S22000D Wedge compression fracture of unspecified thoracic vertebra, subsequent encounter for fracture with routine healing: Secondary | ICD-10-CM | POA: Insufficient documentation

## 2021-02-13 ENCOUNTER — Other Ambulatory Visit (HOSPITAL_COMMUNITY): Payer: Self-pay

## 2021-03-09 ENCOUNTER — Other Ambulatory Visit: Payer: Self-pay | Admitting: Orthopedic Surgery

## 2021-03-09 ENCOUNTER — Telehealth: Payer: Self-pay | Admitting: Family Medicine

## 2021-03-09 ENCOUNTER — Other Ambulatory Visit: Payer: Self-pay

## 2021-03-09 ENCOUNTER — Other Ambulatory Visit (HOSPITAL_COMMUNITY): Payer: Self-pay

## 2021-03-09 DIAGNOSIS — M545 Low back pain, unspecified: Secondary | ICD-10-CM

## 2021-03-09 DIAGNOSIS — R0781 Pleurodynia: Secondary | ICD-10-CM

## 2021-03-09 DIAGNOSIS — S22060D Wedge compression fracture of T7-T8 vertebra, subsequent encounter for fracture with routine healing: Secondary | ICD-10-CM

## 2021-03-09 DIAGNOSIS — M79606 Pain in leg, unspecified: Secondary | ICD-10-CM

## 2021-03-09 NOTE — Telephone Encounter (Signed)
Not good idea to take prednisone since pt has h/o osteoporosis.  Does she have any norco left?  May also have referral to Dr Aline Brochure  Thx.   Dr. Lovena Le

## 2021-03-09 NOTE — Telephone Encounter (Signed)
Patient has been advised per providers recommendations, she verbalized understanding. She does state she has some hydrocodone left and will try it and await appt from orthopedics surg.

## 2021-03-09 NOTE — Telephone Encounter (Signed)
Patient states she is having a flare of her arthritis in her left knee again- started around Wednesday and happens with weather change. Patient states it normally takes a short course of prednisone to help it get better and has been seen for it many times in the past. Patient also needs new referral for Dr Aline Brochure- she is his patient but insurance requires a new referral every so often and hers is expired

## 2021-03-09 NOTE — Telephone Encounter (Signed)
Patient is requesting some steroid for knee pain she is having cant hardly walk. Please advise

## 2021-03-11 MED ORDER — HYDROCODONE-ACETAMINOPHEN 5-325 MG PO TABS: 1.0000 | ORAL_TABLET | Freq: Every evening | ORAL | 0 refills | Status: DC | PRN

## 2021-03-12 ENCOUNTER — Other Ambulatory Visit (HOSPITAL_COMMUNITY): Payer: Self-pay

## 2021-03-12 MED ORDER — METHOCARBAMOL 750 MG PO TABS
750.0000 mg | ORAL_TABLET | Freq: Every day | ORAL | 2 refills | Status: DC
  Filled 2021-03-12: qty 30, 30d supply, fill #0
  Filled 2021-04-25: qty 30, 30d supply, fill #1
  Filled 2021-06-08: qty 30, 30d supply, fill #2

## 2021-03-12 NOTE — Telephone Encounter (Signed)
Patient informed per drs orders 

## 2021-03-14 ENCOUNTER — Other Ambulatory Visit (HOSPITAL_COMMUNITY): Payer: Self-pay

## 2021-03-28 ENCOUNTER — Other Ambulatory Visit: Payer: Self-pay | Admitting: Family Medicine

## 2021-03-28 ENCOUNTER — Other Ambulatory Visit (HOSPITAL_COMMUNITY): Payer: Self-pay

## 2021-04-03 ENCOUNTER — Other Ambulatory Visit (HOSPITAL_COMMUNITY): Payer: Self-pay

## 2021-04-03 ENCOUNTER — Encounter (HOSPITAL_COMMUNITY): Payer: Self-pay

## 2021-04-03 ENCOUNTER — Other Ambulatory Visit: Payer: Self-pay

## 2021-04-03 ENCOUNTER — Encounter (HOSPITAL_COMMUNITY)
Admission: RE | Admit: 2021-04-03 | Discharge: 2021-04-03 | Disposition: A | Discharge status: Home / Self Care | Payer: No Typology Code available for payment source | Source: Ambulatory Visit | Attending: Family Medicine | Admitting: Family Medicine

## 2021-04-03 DIAGNOSIS — M810 Age-related osteoporosis without current pathological fracture: Secondary | ICD-10-CM | POA: Insufficient documentation

## 2021-04-03 LAB — BASIC METABOLIC PANEL
Anion gap: 5 (ref 5–15)
BUN: 16 mg/dL (ref 8–23)
CO2: 27 mmol/L (ref 22–32)
Calcium: 9.1 mg/dL (ref 8.9–10.3)
Chloride: 105 mmol/L (ref 98–111)
Creatinine, Ser: 0.66 mg/dL (ref 0.44–1.00)
GFR, Estimated: >60 mL/min (ref >60)
Glucose, Bld: 103 mg/dL — ABNORMAL HIGH (ref 70–99)
Potassium: 4.1 mmol/L (ref 3.5–5.1)
Sodium: 137 mmol/L (ref 135–145)

## 2021-04-03 MED ORDER — IBANDRONATE SODIUM 3 MG/3ML IV SOLN
3.0000 mg | Freq: Once | INTRAVENOUS | Status: AC
  Administered 2021-04-03: 3 mg via INTRAVENOUS
  Filled 2021-04-03: qty 3

## 2021-04-16 ENCOUNTER — Other Ambulatory Visit (HOSPITAL_COMMUNITY): Payer: Self-pay

## 2021-04-25 ENCOUNTER — Other Ambulatory Visit (HOSPITAL_COMMUNITY): Payer: Self-pay

## 2021-05-14 ENCOUNTER — Other Ambulatory Visit: Payer: Self-pay | Admitting: Family Medicine

## 2021-05-14 ENCOUNTER — Other Ambulatory Visit (HOSPITAL_COMMUNITY): Payer: Self-pay

## 2021-05-14 DIAGNOSIS — G8929 Other chronic pain: Secondary | ICD-10-CM

## 2021-05-15 ENCOUNTER — Other Ambulatory Visit (HOSPITAL_COMMUNITY): Payer: Self-pay

## 2021-05-15 MED ORDER — DICLOFENAC SODIUM 75 MG PO TBEC
75.0000 mg | DELAYED_RELEASE_TABLET | Freq: Two times a day (BID) | ORAL | 1 refills | Status: DC
  Filled 2021-05-15: qty 90, 45d supply, fill #0
  Filled 2021-08-21: qty 90, 45d supply, fill #1

## 2021-06-08 ENCOUNTER — Other Ambulatory Visit (HOSPITAL_COMMUNITY): Payer: Self-pay

## 2021-06-18 ENCOUNTER — Other Ambulatory Visit (HOSPITAL_COMMUNITY): Payer: Self-pay

## 2021-07-03 ENCOUNTER — Encounter (HOSPITAL_COMMUNITY)
Admission: RE | Admit: 2021-07-03 | Discharge: 2021-07-03 | Disposition: A | Discharge status: Home / Self Care | Payer: No Typology Code available for payment source | Source: Ambulatory Visit | Attending: Family Medicine | Admitting: Family Medicine

## 2021-07-03 ENCOUNTER — Other Ambulatory Visit (HOSPITAL_COMMUNITY): Payer: Self-pay

## 2021-07-03 DIAGNOSIS — M810 Age-related osteoporosis without current pathological fracture: Secondary | ICD-10-CM | POA: Diagnosis not present

## 2021-07-03 MED ORDER — IBANDRONATE SODIUM 3 MG/3ML IV SOLN
3.0000 mg | Freq: Once | INTRAVENOUS | Status: AC
  Administered 2021-07-03: 09:00:00 3 mg via INTRAVENOUS
  Filled 2021-07-03: qty 3

## 2021-07-17 ENCOUNTER — Other Ambulatory Visit: Payer: Self-pay | Admitting: Internal Medicine

## 2021-07-17 ENCOUNTER — Other Ambulatory Visit (HOSPITAL_COMMUNITY): Payer: Self-pay

## 2021-07-17 ENCOUNTER — Other Ambulatory Visit: Payer: Self-pay | Admitting: Family Medicine

## 2021-07-17 DIAGNOSIS — Z0000 Encounter for general adult medical examination without abnormal findings: Secondary | ICD-10-CM

## 2021-07-17 MED ORDER — FAMOTIDINE 40 MG PO TABS
40.0000 mg | ORAL_TABLET | Freq: Every day | ORAL | 5 refills | Status: DC
  Filled 2021-07-17: qty 30, 30d supply, fill #0
  Filled 2021-08-28: qty 30, 30d supply, fill #1
  Filled 2021-09-26: qty 30, 30d supply, fill #2
  Filled 2021-10-24: qty 30, 30d supply, fill #3
  Filled 2021-11-23: qty 30, 30d supply, fill #4
  Filled 2021-12-26: qty 30, 30d supply, fill #5

## 2021-07-30 ENCOUNTER — Other Ambulatory Visit: Payer: Self-pay | Admitting: Family Medicine

## 2021-07-30 ENCOUNTER — Other Ambulatory Visit: Payer: Self-pay | Admitting: Internal Medicine

## 2021-07-30 ENCOUNTER — Other Ambulatory Visit (HOSPITAL_COMMUNITY): Payer: Self-pay

## 2021-07-30 DIAGNOSIS — I10 Essential (primary) hypertension: Secondary | ICD-10-CM

## 2021-07-30 MED ORDER — LISINOPRIL 5 MG PO TABS
5.0000 mg | ORAL_TABLET | Freq: Every day | ORAL | 1 refills | Status: DC
  Filled 2021-07-30: qty 90, 90d supply, fill #0
  Filled 2021-10-31: qty 90, 90d supply, fill #1

## 2021-08-21 ENCOUNTER — Ambulatory Visit (INDEPENDENT_AMBULATORY_CARE_PROVIDER_SITE_OTHER): Payer: No Typology Code available for payment source | Admitting: Internal Medicine

## 2021-08-21 ENCOUNTER — Other Ambulatory Visit (HOSPITAL_COMMUNITY): Payer: Self-pay

## 2021-08-21 ENCOUNTER — Other Ambulatory Visit: Payer: Self-pay

## 2021-08-21 ENCOUNTER — Encounter: Payer: Self-pay | Admitting: Internal Medicine

## 2021-08-21 VITALS — BP 124/78 | HR 101 | Ht 62.0 in | Wt 185.0 lb

## 2021-08-21 DIAGNOSIS — I10 Essential (primary) hypertension: Secondary | ICD-10-CM

## 2021-08-21 DIAGNOSIS — M1711 Unilateral primary osteoarthritis, right knee: Secondary | ICD-10-CM

## 2021-08-21 DIAGNOSIS — M8580 Other specified disorders of bone density and structure, unspecified site: Secondary | ICD-10-CM

## 2021-08-21 DIAGNOSIS — Z114 Encounter for screening for human immunodeficiency virus [HIV]: Secondary | ICD-10-CM

## 2021-08-21 DIAGNOSIS — Z1159 Encounter for screening for other viral diseases: Secondary | ICD-10-CM

## 2021-08-21 DIAGNOSIS — Z853 Personal history of malignant neoplasm of breast: Secondary | ICD-10-CM

## 2021-08-21 DIAGNOSIS — Z23 Encounter for immunization: Secondary | ICD-10-CM | POA: Diagnosis not present

## 2021-08-21 DIAGNOSIS — Z0001 Encounter for general adult medical examination with abnormal findings: Secondary | ICD-10-CM | POA: Diagnosis not present

## 2021-08-21 DIAGNOSIS — S22000A Wedge compression fracture of unspecified thoracic vertebra, initial encounter for closed fracture: Secondary | ICD-10-CM

## 2021-08-21 DIAGNOSIS — K219 Gastro-esophageal reflux disease without esophagitis: Secondary | ICD-10-CM

## 2021-08-21 DIAGNOSIS — Z0000 Encounter for general adult medical examination without abnormal findings: Secondary | ICD-10-CM

## 2021-08-21 MED ORDER — IBANDRONATE SODIUM 3 MG/3ML IV SOLN
3.0000 mg | INTRAVENOUS | 3 refills | Status: AC
  Filled 2021-08-21 (×2): qty 3, 90d supply, fill #0

## 2021-08-21 NOTE — Progress Notes (Signed)
New Patient Office Visit  Subjective:  Patient ID: Janice Higgins, female    DOB: April 23, 1956  Age: 66 y.o. MRN: [id number]  CC:  Chief Complaint  Patient presents with   New Patient (Initial Visit)    Establish care     HPI Janice Higgins is a 66 y.o. female with past medical history of HTN, GERD, osteopenia, breast ca. s/p lumpectomy, OA and obesity who presents for establishing care.  She works at [employer].  HTN: BP is well-controlled. Takes medications regularly. Patient denies headache, dizziness, chest pain, dyspnea or palpitations.  She has history of nontraumatic compression fracture of thoracic vertebra.  Of note, her DEXA scan showed osteopenia.  She has tried oral bisphosphonate, but had severe esophagitis with it.  She has been tolerating Boniva well.  She has chronic low back pain and knee pain.  She sees Dr. Aline Brochure for OA of knee.  She takes Voltaren p.o. for arthritis.  She also has Norco for severe pain.  She has history of GERD and takes pantoprazole and Pepcid currently.  She agrees to take only Pepcid for now to reduce her risk of osteoporosis from PPI use.  She has history of breast cancer, which required revision surgeries -lumpectomies in the past.  She has been in remission since 2016, did not require any chemotherapy or radiotherapy.  She has had COVID and flu vaccines.  She received first dose of Shingrix vaccine in the office today.  Past Medical History:  Diagnosis Date   Breast cancer ([place]) 2015   DCIS   Ductal carcinoma in situ (DCIS) of left breast 07/2014   Family history of breast cancer    History of breast cancer 04/25/2015   History of bronchitis    History of kidney stones    Hypertension    Osteoarthritis    hands and feet   Osteoporosis    PMB (postmenopausal bleeding) 04/25/2015   Radiation 10/13/14-11/04/14   Left breast    Past Surgical History:  Procedure Laterality Date   BREAST LUMPECTOMY WITH RADIOACTIVE SEED LOCALIZATION  Left 08/12/2014   Procedure:  RADIOACTIVE SEED LOCALIZATION LEFT BREAST LUMPECTOMY;  Surgeon: Excell Seltzer, MD;  Location: [facility];  Service: General;  Laterality: Left;   COLONOSCOPY N/A 05/04/2015   Procedure: COLONOSCOPY;  Surgeon: Rogene Houston, MD;  [facility];  Service: Endoscopy;  Laterality: N/A;  7:30   FOOT SURGERY Right 1995   exc. bone spur   LASIK     RE-EXCISION OF BREAST LUMPECTOMY Left 08/22/2014   Procedure: RE-EXCISION OF LEFT BREAST LUMPECTOMY;  Surgeon: Excell Seltzer, MD;  Location: [facility] OR;  Service: General;  Laterality: Left;   RE-EXCISION OF BREAST LUMPECTOMY Left 09/05/2014   Procedure: RE-EXCISION OF BREAST LUMPECTOMY;  Surgeon: Excell Seltzer, MD;  Location: [place];  Service: General;  Laterality: Left;   TONSILLECTOMY     TUBAL LIGATION      Family History  Problem Relation Age of Onset   Hypertension Father    Hypertension Mother    Heart disease Mother 72   Breast cancer Sister 45   Other Brother        lived 3 months   Heart attack Maternal Grandfather    Emphysema Paternal Grandfather    COPD Paternal Grandfather     Social History   Socioeconomic History   Marital status: Married    Spouse name: Not on file   Number of children: Not  on file   Years of education: Not on file   Highest education level: Not on file  Occupational History   Not on file  Tobacco Use   Smoking status: Never   Smokeless tobacco: Never  Vaping Use   Vaping Use: Never used  Substance and Sexual Activity   Alcohol use: No   Drug use: No   Sexual activity: Yes    Birth control/protection: Post-menopausal  Other Topics Concern   Not on file  Social History Narrative   Not on file   Social Determinants of Health   Financial Resource Strain: Not on file  Food Insecurity: Not on file  Transportation Needs: Not on file  Physical Activity: Not on file  Stress: Not on file  Social Connections: Not on  file  Intimate Partner Violence: Not on file    ROS Review of Systems  Constitutional:  Negative for chills and fever.  HENT:  Negative for congestion, sinus pressure, sinus pain and sore throat.   Eyes:  Negative for pain and discharge.  Respiratory:  Negative for cough and shortness of breath.   Cardiovascular:  Negative for chest pain and palpitations.  Gastrointestinal:  Negative for abdominal pain, diarrhea, nausea and vomiting.  Endocrine: Negative for polydipsia and polyuria.  Genitourinary:  Negative for dysuria and hematuria.  Musculoskeletal:  Positive for arthralgias and back pain. Negative for neck pain and neck stiffness.  Skin:  Negative for rash.  Neurological:  Negative for dizziness and weakness.  Psychiatric/Behavioral:  Negative for agitation and behavioral problems.    Objective:   Today's Vitals: BP 124/78 (BP Location: Left Arm, Patient Position: Sitting, Cuff Size: Normal)    Pulse (!) 101    Ht 5\' 2"  (1.575 m)    Wt 185 lb (83.9 kg)    SpO2 97%    BMI 33.84 kg/m   Physical Exam Vitals reviewed.  Constitutional:      General: She is not in acute distress.    Appearance: She is obese. She is not diaphoretic.  HENT:     Head: Normocephalic and atraumatic.     Nose: Nose normal.     Mouth/Throat:     Mouth: Mucous membranes are moist.  Eyes:     General: No scleral icterus.    Extraocular Movements: Extraocular movements intact.  Cardiovascular:     Rate and Rhythm: Normal rate and regular rhythm.     Pulses: Normal pulses.     Heart sounds: Normal heart sounds. No murmur heard. Pulmonary:     Breath sounds: Normal breath sounds. No wheezing or rales.  Abdominal:     Palpations: Abdomen is soft.     Tenderness: There is no abdominal tenderness.  Musculoskeletal:     Cervical back: Neck supple. No tenderness.     Right lower leg: No edema.     Left lower leg: No edema.  Skin:    General: Skin is warm.     Findings: No rash.  Neurological:      General: No focal deficit present.     Mental Status: She is alert and oriented to person, place, and time.     Sensory: No sensory deficit.     Motor: No weakness.  Psychiatric:        Mood and Affect: Mood normal.        Behavior: Behavior normal.    Assessment & Plan:   Problem List Items Addressed This Visit       Cardiovascular and  Mediastinum   Essential hypertension - Primary    BP Readings from Last 1 Encounters:  08/21/21 124/78  Well-controlled with lisinopril and amlodipine Counseled for compliance with the medications Advised DASH diet and moderate exercise/walking, at least 150 mins/week        Digestive   Gastroesophageal reflux disease without esophagitis    Continue Pepcid only DC pantoprazole for now        Musculoskeletal and Integument   Unilateral primary osteoarthritis, right knee    Well-controlled with Voltaren p.o. Followed by orthopedic surgeon Takes Norco as needed for severe pain       Osteopenia    With h/o compression fracture of thoracic vertebra Did not tolerate oral bisphosphonate On Boniva currently, refilled as per patient request      Relevant Medications   ibandronate (BONIVA) 3 MG/3ML SOLN injection   Other Relevant Orders   VITAMIN D 25 Hydroxy (Vit-D Deficiency, Fractures)     Other   History of breast cancer    S/p lumpectomies In remission since 2016      Other Visit Diagnoses     Compression fracture of body of thoracic vertebra (HCC)       Relevant Medications   ibandronate (BONIVA) 3 MG/3ML SOLN injection   Other Relevant Orders   VITAMIN D 25 Hydroxy (Vit-D Deficiency, Fractures)   Encounter for screening for HIV       Relevant Orders   HIV antibody (with reflex)   Need for hepatitis C screening test       Relevant Orders   Hepatitis C Antibody   Immunization due       Relevant Orders   Varicella-zoster vaccine IM (Completed)       Outpatient Encounter Medications as of 08/21/2021  Medication Sig    albuterol (VENTOLIN HFA) 108 (90 Base) MCG/ACT inhaler Inhale 2 puffs into the lungs every 4 (four) hours as needed for wheezing or shortness of breath.   amLODipine (NORVASC) 5 MG tablet Take 1 tablet (5 mg total) by mouth daily.   diclofenac (VOLTAREN) 75 MG EC tablet Take 1 tablet (75 mg total) by mouth 2 (two) times daily with a meal   famotidine (PEPCID) 40 MG tablet Take 1 tablet (40 mg total) by mouth daily.   HYDROcodone-acetaminophen (NORCO/VICODIN) 5-325 MG tablet Take 1 tablet by mouth at bedtime as needed for pain   lisinopril (ZESTRIL) 5 MG tablet Take 1 tablet (5 mg total) by mouth daily.   methocarbamol (ROBAXIN) 750 MG tablet Take 1 tablet (750 mg total) by mouth at bedtime.   [DISCONTINUED] pantoprazole (PROTONIX) 40 MG tablet Take 1 tablet (40 mg total) by mouth daily.   guaiFENesin-codeine (CHERATUSSIN AC) 100-10 MG/5ML syrup Take 5 mLs by mouth 3 (three) times daily as needed for cough. (Patient not taking: Reported on 08/21/2021)   ibandronate (BONIVA) 3 MG/3ML SOLN injection Inject 3 mLs (3 mg total) into the vein every 3 (three) months.   [DISCONTINUED] cephALEXin (KEFLEX) 500 MG capsule Take 1 capsule (500 mg total) by mouth 2 (two) times daily. (Patient not taking: Reported on 08/21/2021)   [DISCONTINUED] Doxylamine Succinate, Sleep, (SLEEP AID PO) Take 1 tablet by mouth at bedtime as needed. (Patient not taking: Reported on 08/21/2021)   [DISCONTINUED] fluconazole (DIFLUCAN) 150 MG tablet Take one tablet po now and then may repeat 3 days later if needed (Patient not taking: Reported on 08/21/2021)   [DISCONTINUED] ibandronate (BONIVA) 3 MG/3ML SOLN injection Inject 3 mg into the vein every 3 (  three) months. (Patient not taking: Reported on 08/21/2021)   No facility-administered encounter medications on file as of 08/21/2021.    Follow-up: Return in about 5 months (around 01/18/2022) for Annual physical.   Lindell Spar, MD

## 2021-08-21 NOTE — Assessment & Plan Note (Addendum)
Well-controlled with Voltaren p.o. Followed by orthopedic surgeon Takes Norco as needed for severe pain 

## 2021-08-21 NOTE — Assessment & Plan Note (Signed)
BP Readings from Last 1 Encounters:  08/21/21 124/78   Well-controlled with lisinopril and amlodipine Counseled for compliance with the medications Advised DASH diet and moderate exercise/walking, at least 150 mins/week

## 2021-08-21 NOTE — Patient Instructions (Signed)
Please continue taking medications as prescribed.   Please get fasting blood tests done before the next visit. 

## 2021-08-21 NOTE — Assessment & Plan Note (Signed)
With h/o compression fracture of thoracic vertebra Did not tolerate oral bisphosphonate On Boniva currently, refilled as per patient request 

## 2021-08-21 NOTE — Assessment & Plan Note (Signed)
S/p lumpectomies In remission since 2016

## 2021-08-21 NOTE — Assessment & Plan Note (Signed)
Continue Pepcid only DC pantoprazole for now

## 2021-08-22 ENCOUNTER — Other Ambulatory Visit: Payer: Self-pay | Admitting: Internal Medicine

## 2021-08-22 ENCOUNTER — Encounter: Payer: Self-pay | Admitting: Internal Medicine

## 2021-08-22 DIAGNOSIS — M8580 Other specified disorders of bone density and structure, unspecified site: Secondary | ICD-10-CM

## 2021-08-22 DIAGNOSIS — S22000A Wedge compression fracture of unspecified thoracic vertebra, initial encounter for closed fracture: Secondary | ICD-10-CM

## 2021-08-23 ENCOUNTER — Ambulatory Visit: Payer: No Typology Code available for payment source | Admitting: Internal Medicine

## 2021-08-28 ENCOUNTER — Other Ambulatory Visit (HOSPITAL_COMMUNITY): Payer: Self-pay

## 2021-08-31 ENCOUNTER — Other Ambulatory Visit (HOSPITAL_COMMUNITY): Payer: Self-pay

## 2021-08-31 MED ORDER — ALBUTEROL SULFATE HFA 108 (90 BASE) MCG/ACT IN AERS
2.0000 | INHALATION_SPRAY | RESPIRATORY_TRACT | 2 refills | Status: DC | PRN
  Filled 2021-08-31: qty 8.5, 17d supply, fill #0

## 2021-09-15 ENCOUNTER — Other Ambulatory Visit: Payer: Self-pay | Admitting: Family Medicine

## 2021-09-15 ENCOUNTER — Other Ambulatory Visit: Payer: Self-pay

## 2021-09-15 DIAGNOSIS — I10 Essential (primary) hypertension: Secondary | ICD-10-CM

## 2021-09-17 ENCOUNTER — Other Ambulatory Visit (HOSPITAL_COMMUNITY): Payer: Self-pay

## 2021-09-25 ENCOUNTER — Ambulatory Visit: Payer: No Typology Code available for payment source | Admitting: Internal Medicine

## 2021-09-26 ENCOUNTER — Other Ambulatory Visit: Payer: Self-pay | Admitting: Internal Medicine

## 2021-09-26 ENCOUNTER — Other Ambulatory Visit (HOSPITAL_COMMUNITY): Payer: Self-pay

## 2021-09-26 DIAGNOSIS — S22060D Wedge compression fracture of T7-T8 vertebra, subsequent encounter for fracture with routine healing: Secondary | ICD-10-CM

## 2021-09-26 DIAGNOSIS — I10 Essential (primary) hypertension: Secondary | ICD-10-CM

## 2021-09-26 DIAGNOSIS — R0781 Pleurodynia: Secondary | ICD-10-CM

## 2021-09-26 MED FILL — Amlodipine Besylate Tab 5 MG (Base Equivalent): ORAL | 90 days supply | Qty: 90 | Fill #0 | Status: AC

## 2021-09-28 ENCOUNTER — Other Ambulatory Visit: Payer: Self-pay | Admitting: Internal Medicine

## 2021-09-28 ENCOUNTER — Other Ambulatory Visit (HOSPITAL_COMMUNITY): Payer: Self-pay

## 2021-09-28 DIAGNOSIS — R0781 Pleurodynia: Secondary | ICD-10-CM

## 2021-09-28 DIAGNOSIS — S22060D Wedge compression fracture of T7-T8 vertebra, subsequent encounter for fracture with routine healing: Secondary | ICD-10-CM

## 2021-10-01 ENCOUNTER — Other Ambulatory Visit (HOSPITAL_COMMUNITY): Payer: Self-pay

## 2021-10-02 ENCOUNTER — Other Ambulatory Visit: Payer: Self-pay | Admitting: Internal Medicine

## 2021-10-02 ENCOUNTER — Other Ambulatory Visit (HOSPITAL_COMMUNITY): Payer: Self-pay

## 2021-10-02 ENCOUNTER — Encounter (HOSPITAL_COMMUNITY): Payer: No Typology Code available for payment source

## 2021-10-02 ENCOUNTER — Encounter (HOSPITAL_COMMUNITY)
Admission: RE | Admit: 2021-10-02 | Discharge: 2021-10-02 | Disposition: A | Discharge status: Home / Self Care | Payer: No Typology Code available for payment source | Source: Ambulatory Visit | Attending: Nephrology | Admitting: Nephrology

## 2021-10-02 DIAGNOSIS — X58XXXD Exposure to other specified factors, subsequent encounter: Secondary | ICD-10-CM | POA: Insufficient documentation

## 2021-10-02 DIAGNOSIS — M810 Age-related osteoporosis without current pathological fracture: Secondary | ICD-10-CM | POA: Diagnosis present

## 2021-10-02 DIAGNOSIS — S22000D Wedge compression fracture of unspecified thoracic vertebra, subsequent encounter for fracture with routine healing: Secondary | ICD-10-CM | POA: Diagnosis not present

## 2021-10-02 DIAGNOSIS — R0781 Pleurodynia: Secondary | ICD-10-CM

## 2021-10-02 DIAGNOSIS — S22060D Wedge compression fracture of T7-T8 vertebra, subsequent encounter for fracture with routine healing: Secondary | ICD-10-CM

## 2021-10-02 MED ORDER — HYDROCODONE-ACETAMINOPHEN 5-325 MG PO TABS
1.0000 | ORAL_TABLET | Freq: Every evening | ORAL | 0 refills | Status: DC | PRN
  Filled 2021-10-02: qty 45, 45d supply, fill #0

## 2021-10-02 MED ORDER — IBANDRONATE SODIUM 3 MG/3ML IV SOLN
3.0000 mg | Freq: Once | INTRAVENOUS | Status: AC
  Administered 2021-10-02: 3 mg via INTRAVENOUS
  Filled 2021-10-02: qty 3

## 2021-10-24 ENCOUNTER — Other Ambulatory Visit (HOSPITAL_COMMUNITY): Payer: Self-pay

## 2021-10-31 ENCOUNTER — Other Ambulatory Visit (HOSPITAL_COMMUNITY): Payer: Self-pay

## 2021-11-07 ENCOUNTER — Other Ambulatory Visit: Payer: Self-pay | Admitting: Orthopedic Surgery

## 2021-11-07 ENCOUNTER — Other Ambulatory Visit (HOSPITAL_COMMUNITY): Payer: Self-pay

## 2021-11-07 DIAGNOSIS — M545 Low back pain, unspecified: Secondary | ICD-10-CM

## 2021-11-08 ENCOUNTER — Other Ambulatory Visit (HOSPITAL_COMMUNITY): Payer: Self-pay

## 2021-11-08 MED ORDER — METHOCARBAMOL 750 MG PO TABS
750.0000 mg | ORAL_TABLET | Freq: Every day | ORAL | 2 refills | Status: DC
  Filled 2021-11-08: qty 30, 30d supply, fill #0
  Filled 2022-03-01: qty 30, 30d supply, fill #1

## 2021-11-13 ENCOUNTER — Other Ambulatory Visit: Payer: Self-pay | Admitting: Family Medicine

## 2021-11-13 ENCOUNTER — Other Ambulatory Visit: Payer: Self-pay | Admitting: Internal Medicine

## 2021-11-13 ENCOUNTER — Other Ambulatory Visit (HOSPITAL_COMMUNITY): Payer: Self-pay

## 2021-11-13 DIAGNOSIS — G8929 Other chronic pain: Secondary | ICD-10-CM

## 2021-11-14 ENCOUNTER — Other Ambulatory Visit (HOSPITAL_COMMUNITY): Payer: Self-pay

## 2021-11-14 MED ORDER — DICLOFENAC SODIUM 75 MG PO TBEC
75.0000 mg | DELAYED_RELEASE_TABLET | Freq: Two times a day (BID) | ORAL | 1 refills | Status: DC
  Filled 2021-11-14: qty 90, 45d supply, fill #0
  Filled 2022-02-11: qty 90, 45d supply, fill #1

## 2021-11-23 ENCOUNTER — Other Ambulatory Visit (HOSPITAL_COMMUNITY): Payer: Self-pay

## 2021-12-11 ENCOUNTER — Encounter: Payer: Self-pay | Admitting: Internal Medicine

## 2021-12-11 ENCOUNTER — Ambulatory Visit (INDEPENDENT_AMBULATORY_CARE_PROVIDER_SITE_OTHER): Payer: No Typology Code available for payment source | Admitting: Internal Medicine

## 2021-12-11 DIAGNOSIS — J029 Acute pharyngitis, unspecified: Secondary | ICD-10-CM

## 2021-12-11 MED ORDER — AZITHROMYCIN 250 MG PO TABS: ORAL_TABLET | ORAL | 0 refills | Status: AC

## 2021-12-11 NOTE — Progress Notes (Signed)
Virtual Visit via Telephone Note   This visit type was conducted due to national recommendations for restrictions regarding the COVID-19 Pandemic (e.g. social distancing) in an effort to limit this patient's exposure and mitigate transmission in our community.  Due to her co-morbid illnesses, this patient is at least at moderate risk for complications without adequate follow up.  This format is felt to be most appropriate for this patient at this time.  The patient did not have access to video technology/had technical difficulties with video requiring transitioning to audio format only (telephone).  All issues noted in this document were discussed and addressed.  No physical exam could be performed with this format.  Evaluation Performed:  Follow-up visit  Date:  12/11/2021   ID:  Janice Higgins, Janice Higgins 1956-01-29, MRN [id number]  Patient Location: Home Provider Location: Office/Clinic  Participants: Patient Location of Patient: Home Location of Provider: Telehealth Consent was obtain for visit to be over via telehealth. I verified that I am speaking with the correct person using two identifiers.  PCP:  Lindell Spar, MD   Chief Complaint: Sore throat  History of Present Illness:    Janice Higgins is a 66 y.o. female who has a televisit for c/o sore throat for the last 5 days.  She also has noticed lymph node swelling in her neck area.  She has cough and mild dyspnea, for which she has used albuterol inhaler.  She denies any fever or chills,  nausea or vomiting currently.  Her home COVID test was negative.  She has tried DayQuil/NyQuil with no relief.  The patient does not have symptoms concerning for COVID-19 infection (fever, chills, cough, or new shortness of breath).   Past Medical, Surgical, Social History, Allergies, and Medications have been Reviewed.  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    Family history of breast cancer    History of breast cancer 04/25/2015   History of bronchitis    History of kidney stones    Hypertension    Osteoarthritis    hands and feet   Osteoporosis    PMB (postmenopausal bleeding) 04/25/2015   Radiation 10/13/14-11/04/14   Left breast   Past Surgical History:  Procedure Laterality Date   BREAST LUMPECTOMY WITH RADIOACTIVE SEED LOCALIZATION Left 08/12/2014   Procedure:  RADIOACTIVE SEED LOCALIZATION LEFT BREAST LUMPECTOMY;  Surgeon: Excell Seltzer, MD;  Location: [facility];  Service: General;  Laterality: Left;   COLONOSCOPY N/A 05/04/2015   Procedure: COLONOSCOPY;  Surgeon: Rogene Houston, MD;  [facility];  Service: Endoscopy;  Laterality: N/A;  7:30   FOOT SURGERY Right 1995   exc. bone spur   LASIK     RE-EXCISION OF BREAST LUMPECTOMY Left 08/22/2014   Procedure: RE-EXCISION OF LEFT BREAST LUMPECTOMY;  Surgeon: Excell Seltzer, MD;  Location: [facility];  Service: General;  Laterality: Left;   RE-EXCISION OF BREAST LUMPECTOMY Left 09/05/2014   Procedure: RE-EXCISION OF BREAST LUMPECTOMY;  Surgeon: Excell Seltzer, MD;  Location: [facility];  Service: General;  Laterality: Left;   TONSILLECTOMY     TUBAL LIGATION       Current Meds  Medication Sig   albuterol (VENTOLIN HFA) 108 (90 Base) MCG/ACT inhaler Inhale 2 puffs into the lungs every 4 (four) hours as needed for wheezing or shortness of breath.   albuterol (VENTOLIN  HFA) 108 (90 Base) MCG/ACT inhaler Inhale 2 puffs into the lungs every 4 (four) hours as needed for wheezing or shortness of breath   amLODipine (NORVASC) 5 MG tablet Take 1 tablet (5 mg total) by mouth daily.   diclofenac (VOLTAREN) 75 MG EC tablet Take 1 tablet (75 mg total) by mouth 2 (two) times daily with a meal   famotidine (PEPCID) 40 MG tablet Take 1 tablet (40 mg total) by mouth daily.   HYDROcodone-acetaminophen (NORCO/VICODIN) 5-325 MG tablet Take 1 tablet by mouth  at bedtime as needed for pain   ibandronate (BONIVA) 3 MG/3ML SOLN injection Inject 3 mLs (3 mg total) into the vein every 3 (three) months.   lisinopril (ZESTRIL) 5 MG tablet Take 1 tablet (5 mg total) by mouth daily.   methocarbamol (ROBAXIN) 750 MG tablet Take 1 tablet (750 mg total) by mouth at bedtime.     Allergies:   Fosamax [alendronate sodium] and Sulfa antibiotics   ROS:   Please see the history of present illness.     All other systems reviewed and are negative.   Labs/Other Tests and Data Reviewed:    Recent Labs: 04/03/2021: BUN 16; Creatinine, Ser 0.66; Potassium 4.1; Sodium 137   Recent Lipid Panel Lab Results  Component Value Date/Time   CHOL 161 01/06/2020 08:04 AM   TRIG 71 01/06/2020 08:04 AM   HDL 65 01/06/2020 08:04 AM   CHOLHDL 2.5 01/06/2020 08:04 AM   CHOLHDL 2.5 12/14/2018 08:24 AM   LDLCALC 82 01/06/2020 08:04 AM    Wt Readings from Last 3 Encounters:  08/21/21 185 lb (83.9 kg)  04/03/21 188 lb 15 oz (85.7 kg)  01/12/21 188 lb (85.3 kg)     ASSESSMENT & PLAN:    Acute pharyngitis Started empiric azithromycin as she has persistent symptoms despite symptomatic treatment Salt water or warm water gargling advised Robitussin-DM as needed for cough  Time:   Today, I have spent 9 minutes reviewing the chart, including problem list, medications, and with the patient with telehealth technology discussing the above problems.   Medication Adjustments/Labs and Tests Ordered: Current medicines are reviewed at length with the patient today.  Concerns regarding medicines are outlined above.   Tests Ordered: No orders of the defined types were placed in this encounter.   Medication Changes: No orders of the defined types were placed in this encounter.    Note: This dictation was prepared with Dragon dictation along with smaller phrase technology. Similar sounding words can be transcribed inadequately or may not be corrected upon review. Any  transcriptional errors that result from this process are unintentional.      Disposition:  Follow up  Signed, Lindell Spar, MD  12/11/2021 3:29 PM     [place]

## 2021-12-11 NOTE — Telephone Encounter (Signed)
Appt made 12-11-21

## 2021-12-18 ENCOUNTER — Other Ambulatory Visit (HOSPITAL_COMMUNITY): Payer: Self-pay

## 2021-12-18 MED FILL — Amlodipine Besylate Tab 5 MG (Base Equivalent): ORAL | 90 days supply | Qty: 90 | Fill #1 | Status: AC

## 2021-12-26 ENCOUNTER — Other Ambulatory Visit (HOSPITAL_COMMUNITY): Payer: Self-pay

## 2022-01-08 ENCOUNTER — Encounter (HOSPITAL_COMMUNITY)
Admission: RE | Admit: 2022-01-08 | Discharge: 2022-01-08 | Disposition: A | Discharge status: Home / Self Care | Payer: No Typology Code available for payment source | Source: Ambulatory Visit | Attending: Internal Medicine | Admitting: Internal Medicine

## 2022-01-08 DIAGNOSIS — M810 Age-related osteoporosis without current pathological fracture: Secondary | ICD-10-CM | POA: Diagnosis present

## 2022-01-08 MED ORDER — IBANDRONATE SODIUM 3 MG/3ML IV SOLN
3.0000 mg | Freq: Once | INTRAVENOUS | Status: AC
  Administered 2022-01-08: 3 mg via INTRAVENOUS

## 2022-01-08 MED ORDER — IBANDRONATE SODIUM 3 MG/3ML IV SOLN
INTRAVENOUS | Status: AC
  Filled 2022-01-08: qty 3

## 2022-01-17 LAB — CBC WITH DIFFERENTIAL/PLATELET
Basophils Absolute: 0.1 10*3/uL (ref 0.0–0.2)
Basos: 1 %
EOS (ABSOLUTE): 0.3 10*3/uL (ref 0.0–0.4)
Eos: 4 %
Hematocrit: 43.8 % (ref 34.0–46.6)
Hemoglobin: 14.4 g/dL (ref 11.1–15.9)
Immature Grans (Abs): 0 10*3/uL (ref 0.0–0.1)
Immature Granulocytes: 0 %
Lymphocytes Absolute: 2.1 10*3/uL (ref 0.7–3.1)
Lymphs: 30 %
MCH: 30.1 pg (ref 26.6–33.0)
MCHC: 32.9 g/dL (ref 31.5–35.7)
MCV: 91 fL (ref 79–97)
Monocytes Absolute: 0.5 10*3/uL (ref 0.1–0.9)
Monocytes: 7 %
Neutrophils Absolute: 4.1 10*3/uL (ref 1.4–7.0)
Neutrophils: 58 %
Platelets: 233 10*3/uL (ref 150–450)
RBC: 4.79 x10E6/uL (ref 3.77–5.28)
RDW: 12.7 % (ref 11.7–15.4)
WBC: 7.1 10*3/uL (ref 3.4–10.8)

## 2022-01-17 LAB — CMP14+EGFR
ALT: 30 IU/L (ref 0–32)
AST: 20 IU/L (ref 0–40)
Albumin/Globulin Ratio: 1.9 (ref 1.2–2.2)
Albumin: 4.5 g/dL (ref 3.8–4.8)
Alkaline Phosphatase: 58 IU/L (ref 44–121)
BUN/Creatinine Ratio: 28 (ref 12–28)
BUN: 21 mg/dL (ref 8–27)
Bilirubin Total: 0.3 mg/dL (ref 0.0–1.2)
CO2: 22 mmol/L (ref 20–29)
Calcium: 9.2 mg/dL (ref 8.7–10.3)
Chloride: 105 mmol/L (ref 96–106)
Creatinine, Ser: 0.75 mg/dL (ref 0.57–1.00)
Globulin, Total: 2.4 g/dL (ref 1.5–4.5)
Glucose: 98 mg/dL (ref 70–99)
Potassium: 4.7 mmol/L (ref 3.5–5.2)
Sodium: 140 mmol/L (ref 134–144)
Total Protein: 6.9 g/dL (ref 6.0–8.5)
eGFR: 88 mL/min/{1.73_m2} (ref >59)

## 2022-01-17 LAB — LIPID PANEL
Chol/HDL Ratio: 3.3 ratio (ref 0.0–4.4)
Cholesterol, Total: 177 mg/dL (ref 100–199)
HDL: 53 mg/dL (ref >39)
LDL Chol Calc (NIH): 95 mg/dL (ref 0–99)
Triglycerides: 171 mg/dL — ABNORMAL HIGH (ref 0–149)
VLDL Cholesterol Cal: 29 mg/dL (ref 5–40)

## 2022-01-17 LAB — TSH: TSH: 3.68 u[IU]/mL (ref 0.450–4.500)

## 2022-01-17 LAB — HIV ANTIBODY (ROUTINE TESTING W REFLEX): HIV Screen 4th Generation wRfx: NONREACTIVE

## 2022-01-17 LAB — HEPATITIS C ANTIBODY: Hep C Virus Ab: NONREACTIVE

## 2022-01-17 LAB — VITAMIN D 25 HYDROXY (VIT D DEFICIENCY, FRACTURES): Vit D, 25-Hydroxy: 52 ng/mL (ref 30.0–100.0)

## 2022-01-17 LAB — HEMOGLOBIN A1C
Est. average glucose Bld gHb Est-mCnc: 126 mg/dL
Hgb A1c MFr Bld: 6 % — ABNORMAL HIGH (ref 4.8–5.6)

## 2022-01-18 ENCOUNTER — Other Ambulatory Visit (HOSPITAL_COMMUNITY): Payer: Self-pay | Admitting: Internal Medicine

## 2022-01-18 DIAGNOSIS — Z1231 Encounter for screening mammogram for malignant neoplasm of breast: Secondary | ICD-10-CM

## 2022-01-21 ENCOUNTER — Ambulatory Visit (INDEPENDENT_AMBULATORY_CARE_PROVIDER_SITE_OTHER): Payer: No Typology Code available for payment source | Admitting: Internal Medicine

## 2022-01-21 ENCOUNTER — Other Ambulatory Visit (HOSPITAL_COMMUNITY): Payer: Self-pay

## 2022-01-21 ENCOUNTER — Encounter: Payer: Self-pay | Admitting: Internal Medicine

## 2022-01-21 VITALS — BP 136/80 | HR 86 | Ht 61.5 in | Wt 188.8 lb

## 2022-01-21 DIAGNOSIS — K219 Gastro-esophageal reflux disease without esophagitis: Secondary | ICD-10-CM

## 2022-01-21 DIAGNOSIS — Z0001 Encounter for general adult medical examination with abnormal findings: Secondary | ICD-10-CM | POA: Diagnosis not present

## 2022-01-21 DIAGNOSIS — R300 Dysuria: Secondary | ICD-10-CM | POA: Diagnosis not present

## 2022-01-21 DIAGNOSIS — I10 Essential (primary) hypertension: Secondary | ICD-10-CM

## 2022-01-21 DIAGNOSIS — M1711 Unilateral primary osteoarthritis, right knee: Secondary | ICD-10-CM | POA: Diagnosis not present

## 2022-01-21 DIAGNOSIS — E669 Obesity, unspecified: Secondary | ICD-10-CM | POA: Insufficient documentation

## 2022-01-21 LAB — POCT URINALYSIS DIP (CLINITEK)
Bilirubin, UA: NEGATIVE
Glucose, UA: NEGATIVE mg/dL
Ketones, POC UA: NEGATIVE mg/dL
Leukocytes, UA: NEGATIVE
Nitrite, UA: NEGATIVE
Spec Grav, UA: >=1.03 — AB (ref 1.010–1.025)
Urobilinogen, UA: 0.2 E.U./dL
pH, UA: 5.5 (ref 5.0–8.0)

## 2022-01-21 MED ORDER — SEMAGLUTIDE-WEIGHT MANAGEMENT 2.4 MG/0.75ML ~~LOC~~ SOAJ
2.4000 mg | SUBCUTANEOUS | 0 refills | Status: DC
  Filled 2022-01-21 – 2022-06-24 (×3): qty 3, 28d supply, fill #0

## 2022-01-21 MED ORDER — AMLODIPINE BESYLATE 5 MG PO TABS
5.0000 mg | ORAL_TABLET | Freq: Every day | ORAL | 3 refills | Status: DC
  Filled 2022-01-21 – 2022-03-21 (×2): qty 90, 90d supply, fill #0
  Filled 2022-06-24: qty 90, 90d supply, fill #1
  Filled 2022-09-16: qty 90, 90d supply, fill #0
  Filled 2022-12-23: qty 90, 90d supply, fill #1

## 2022-01-21 MED ORDER — SEMAGLUTIDE-WEIGHT MANAGEMENT 1 MG/0.5ML ~~LOC~~ SOAJ
1.0000 mg | SUBCUTANEOUS | 0 refills | Status: AC
  Filled 2022-01-21 – 2022-04-01 (×2): qty 2, 28d supply, fill #0

## 2022-01-21 MED ORDER — SEMAGLUTIDE-WEIGHT MANAGEMENT 0.25 MG/0.5ML ~~LOC~~ SOAJ
0.2500 mg | SUBCUTANEOUS | 0 refills | Status: DC
  Filled 2022-01-21 – 2022-02-22 (×3): qty 2, 28d supply, fill #0

## 2022-01-21 MED ORDER — LISINOPRIL 5 MG PO TABS
5.0000 mg | ORAL_TABLET | Freq: Every day | ORAL | 3 refills | Status: DC
  Filled 2022-01-21: qty 90, 90d supply, fill #0
  Filled 2022-04-29: qty 90, 90d supply, fill #1
  Filled 2022-08-08: qty 90, 90d supply, fill #2
  Filled 2022-11-26: qty 90, 90d supply, fill #0

## 2022-01-21 MED ORDER — SEMAGLUTIDE-WEIGHT MANAGEMENT 0.5 MG/0.5ML ~~LOC~~ SOAJ
0.5000 mg | SUBCUTANEOUS | 0 refills | Status: DC
  Filled 2022-01-21 – 2022-03-20 (×5): qty 2, 28d supply, fill #0

## 2022-01-21 MED ORDER — SEMAGLUTIDE-WEIGHT MANAGEMENT 1.7 MG/0.75ML ~~LOC~~ SOAJ
1.7000 mg | SUBCUTANEOUS | 0 refills | Status: DC
  Filled 2022-01-21 – 2022-04-23 (×3): qty 3, 28d supply, fill #0

## 2022-01-21 NOTE — Patient Instructions (Signed)
Please start taking Wegovy as prescribed.  Please continue to take medications as prescribed.  Please continue to follow low carb diet and perform moderate exercise/walking at least 150 mins/week.

## 2022-01-21 NOTE — Progress Notes (Signed)
Established Patient Office Visit  Subjective:  Patient ID: Janice Higgins, female    DOB: 09-22-1955  Age: 66 y.o. MRN: [id number]  CC:  Chief Complaint  Patient presents with   Annual Exam    Wants to talk about blood work   Urinary Tract Infection    HPI Janice Higgins is a 66 y.o. female with past medical history of HTN, GERD, osteopenia, breast ca. s/p lumpectomy, OA and obesity who presents for annual physical.  HTN: BP is well-controlled. Takes medications regularly. Patient denies headache, dizziness, chest pain, dyspnea or palpitations.  Her DEXA scan showed osteopenia.  She has tried oral bisphosphonate, but had severe esophagitis with it.  She has been tolerating Boniva well.  She takes Pepcid for GERD now.  She complains of worsening of acid reflux since discontinuing Protonix.  Denies any nausea, vomiting, melena or hematochezia.  She is concerned about her weight.  She has tried to follow low-carb diet, but has been unable to lose weight.  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   Family history of breast cancer    History of breast cancer 04/25/2015   History of bronchitis    History of kidney stones    Hypertension    Osteoarthritis    hands and feet   Osteoporosis    PMB (postmenopausal bleeding) 04/25/2015   Radiation 10/13/14-11/04/14   Left breast    Past Surgical History:  Procedure Laterality Date   BREAST LUMPECTOMY WITH RADIOACTIVE SEED LOCALIZATION Left 08/12/2014   Procedure:  RADIOACTIVE SEED LOCALIZATION LEFT BREAST LUMPECTOMY;  Surgeon: Excell Seltzer, MD;  Location: [facility];  Service: General;  Laterality: Left;   COLONOSCOPY N/A 05/04/2015   Procedure: COLONOSCOPY;  Surgeon: Rogene Houston, MD;  [facility];  Service: Endoscopy;  Laterality: N/A;  7:30   FOOT SURGERY Right 1995   exc. bone spur   LASIK     RE-EXCISION OF  BREAST LUMPECTOMY Left 08/22/2014   Procedure: RE-EXCISION OF LEFT BREAST LUMPECTOMY;  Surgeon: Excell Seltzer, MD;  Location: [facility];  Service: General;  Laterality: Left;   RE-EXCISION OF BREAST LUMPECTOMY Left 09/05/2014   Procedure: RE-EXCISION OF BREAST LUMPECTOMY;  Surgeon: Excell Seltzer, MD;  Location: [facility];  Service: General;  Laterality: Left;   TONSILLECTOMY     TUBAL LIGATION      Family History  Problem Relation Age of Onset   Hypertension Father    Hypertension Mother    Heart disease Mother 47   Breast cancer Sister 73   Other Brother        lived 3 months   Heart attack Maternal Grandfather    Emphysema Paternal Grandfather    COPD Paternal Grandfather     Social History   Socioeconomic History   Marital status: Married    Spouse name: Not on file   Number of children: Not on file   Years of education: Not on file   Highest education level: Not on file  Occupational History   Not on file  Tobacco Use   Smoking status: Never   Smokeless tobacco: Never  Vaping Use   Vaping Use: Never used  Substance and Sexual Activity   Alcohol use: No   Drug use: No   Sexual activity: Yes    Birth control/protection: Post-menopausal  Other Topics Concern  Not on file  Social History Narrative   Not on file   Social Determinants of Health   Financial Resource Strain: Not on file  Food Insecurity: Not on file  Transportation Needs: Not on file  Physical Activity: Not on file  Stress: Not on file  Social Connections: Not on file  Intimate Partner Violence: Not on file    Outpatient Medications Prior to Visit  Medication Sig Dispense Refill   albuterol (VENTOLIN HFA) 108 (90 Base) MCG/ACT inhaler Inhale 2 puffs into the lungs every 4 (four) hours as needed for wheezing or shortness of breath. 1 each 3   diclofenac (VOLTAREN) 75 MG EC tablet Take 1 tablet (75 mg total) by mouth 2 (two) times daily with a meal 90 tablet 1   famotidine  (PEPCID) 40 MG tablet Take 1 tablet (40 mg total) by mouth daily. 30 tablet 5   HYDROcodone-acetaminophen (NORCO/VICODIN) 5-325 MG tablet Take 1 tablet by mouth at bedtime as needed for pain 45 tablet 0   ibandronate (BONIVA) 3 MG/3ML SOLN injection Inject 3 mLs (3 mg total) into the vein every 3 (three) months. 3 mL 3   methocarbamol (ROBAXIN) 750 MG tablet Take 1 tablet (750 mg total) by mouth at bedtime. 30 tablet 2   amLODipine (NORVASC) 5 MG tablet Take 1 tablet (5 mg total) by mouth daily. 90 tablet 1   lisinopril (ZESTRIL) 5 MG tablet Take 1 tablet (5 mg total) by mouth daily. 90 tablet 1   albuterol (VENTOLIN HFA) 108 (90 Base) MCG/ACT inhaler Inhale 2 puffs into the lungs every 4 (four) hours as needed for wheezing or shortness of breath 8.5 g 2   No facility-administered medications prior to visit.    Allergies  Allergen Reactions   Fosamax [Alendronate Sodium] Other (See Comments)    Severe GI Upset   Sulfa Antibiotics Hives    ROS Review of Systems  Constitutional:  Negative for chills and fever.  HENT:  Negative for congestion, sinus pressure, sinus pain and sore throat.   Eyes:  Negative for pain and discharge.  Respiratory:  Negative for cough and shortness of breath.   Cardiovascular:  Negative for chest pain and palpitations.  Gastrointestinal:  Negative for abdominal pain, diarrhea, nausea and vomiting.  Endocrine: Negative for polydipsia and polyuria.  Genitourinary:  Positive for dysuria. Negative for hematuria.  Musculoskeletal:  Positive for arthralgias and back pain. Negative for neck pain and neck stiffness.  Skin:  Negative for rash.  Neurological:  Negative for dizziness and weakness.  Psychiatric/Behavioral:  Negative for agitation and behavioral problems.       Objective:    Physical Exam Vitals reviewed.  Constitutional:      General: She is not in acute distress.    Appearance: She is obese. She is not diaphoretic.  HENT:     Head:  Normocephalic and atraumatic.     Nose: Nose normal.     Mouth/Throat:     Mouth: Mucous membranes are moist.  Eyes:     General: No scleral icterus.    Extraocular Movements: Extraocular movements intact.  Cardiovascular:     Rate and Rhythm: Normal rate and regular rhythm.     Pulses: Normal pulses.     Heart sounds: Normal heart sounds. No murmur heard. Pulmonary:     Breath sounds: Normal breath sounds. No wheezing or rales.  Abdominal:     Palpations: Abdomen is soft.     Tenderness: There is no abdominal tenderness.  Musculoskeletal:     Cervical back: Neck supple. No tenderness.     Right lower leg: No edema.     Left lower leg: No edema.  Skin:    General: Skin is warm.     Findings: No rash.  Neurological:     General: No focal deficit present.     Mental Status: She is alert and oriented to person, place, and time.     Sensory: No sensory deficit.     Motor: No weakness.  Psychiatric:        Mood and Affect: Mood normal.        Behavior: Behavior normal.     BP 136/80   Pulse 86   Ht 5' 1.5" (1.562 m)   Wt 188 lb 12.8 oz (85.6 kg)   SpO2 95%   BMI 35.10 kg/m  Wt Readings from Last 3 Encounters:  01/21/22 188 lb 12.8 oz (85.6 kg)  08/21/21 185 lb (83.9 kg)  04/03/21 188 lb 15 oz (85.7 kg)    Lab Results  Component Value Date   TSH 3.680 01/16/2022   Lab Results  Component Value Date   WBC 7.1 01/16/2022   HGB 14.4 01/16/2022   HCT 43.8 01/16/2022   MCV 91 01/16/2022   PLT 233 01/16/2022   Lab Results  Component Value Date   NA 140 01/16/2022   K 4.7 01/16/2022   CO2 22 01/16/2022   GLUCOSE 98 01/16/2022   BUN 21 01/16/2022   CREATININE 0.75 01/16/2022   BILITOT 0.3 01/16/2022   ALKPHOS 58 01/16/2022   AST 20 01/16/2022   ALT 30 01/16/2022   PROT 6.9 01/16/2022   ALBUMIN 4.5 01/16/2022   CALCIUM 9.2 01/16/2022   ANIONGAP 5 04/03/2021   EGFR 88 01/16/2022   Lab Results  Component Value Date   CHOL 177 01/16/2022   Lab Results   Component Value Date   HDL 53 01/16/2022   Lab Results  Component Value Date   LDLCALC 95 01/16/2022   Lab Results  Component Value Date   TRIG 171 (H) 01/16/2022   Lab Results  Component Value Date   CHOLHDL 3.3 01/16/2022   Lab Results  Component Value Date   HGBA1C 6.0 (H) 01/16/2022      Assessment & Plan:   Problem List Items Addressed This Visit       Cardiovascular and Mediastinum   Essential hypertension    BP Readings from Last 1 Encounters:  01/21/22 136/80  Well-controlled with lisinopril and amlodipine Counseled for compliance with the medications Advised DASH diet and moderate exercise/walking, at least 150 mins/week      Relevant Medications   lisinopril (ZESTRIL) 5 MG tablet   amLODipine (NORVASC) 5 MG tablet     Digestive   Gastroesophageal reflux disease without esophagitis    Continue Pepcid only Has recent worsening of his acid reflux, advised to take omeprazole as needed        Musculoskeletal and Integument   Unilateral primary osteoarthritis, right knee    Well-controlled with Voltaren p.o. Followed by orthopedic surgeon Takes Norco as needed for severe pain        Other   Encounter for general adult medical examination with abnormal findings - Primary    Physical exam as documented. Counseling done  re healthy lifestyle involving commitment to 150 minutes exercise per week, heart healthy diet, and attaining healthy weight.The importance of adequate sleep also discussed. Changes in health habits are decided on by the  patient with goals and time frames  set for achieving them. Immunization and cancer screening needs are specifically addressed at this visit.      Morbid obesity ([place])    BMI Readings from Last 3 Encounters:  01/21/22 35.10 kg/m  08/21/21 33.84 kg/m  04/03/21 34.56 kg/m  Advised to follow low-carb diet and perform more moderate exercise/walking at least 150 minutes/week Started Wegovy - initial BMI - 35.10,  increase dose as tolerated      Relevant Medications   Semaglutide-Weight Management 0.25 MG/0.5ML SOAJ   Semaglutide-Weight Management 0.5 MG/0.5ML SOAJ (Start on 02/19/2022)   Semaglutide-Weight Management 1 MG/0.5ML SOAJ (Start on 03/20/2022)   Semaglutide-Weight Management 1.7 MG/0.75ML SOAJ (Start on 04/18/2022)   Semaglutide-Weight Management 2.4 MG/0.75ML SOAJ (Start on 05/17/2022)   Other Visit Diagnoses     Dysuria     Complains of mild dysuria UA reviewed -moderate blood cells, but LE and nitrite negative Check urine culture Advised to increase fluid intake for now   Relevant Orders   POCT URINALYSIS DIP (CLINITEK) (Completed)   Urine Culture       Meds ordered this encounter  Medications   Semaglutide-Weight Management 0.25 MG/0.5ML SOAJ    Sig: Inject 0.25 mg into the skin once a week for 28 days.    Dispense:  2 mL    Refill:  0   Semaglutide-Weight Management 0.5 MG/0.5ML SOAJ    Sig: Inject 0.5 mg into the skin once a week for 28 days.    Dispense:  2 mL    Refill:  0   Semaglutide-Weight Management 1 MG/0.5ML SOAJ    Sig: Inject 1 mg into the skin once a week for 28 days.    Dispense:  2 mL    Refill:  0   Semaglutide-Weight Management 1.7 MG/0.75ML SOAJ    Sig: Inject 1.7 mg into the skin once a week for 28 days.    Dispense:  3 mL    Refill:  0   Semaglutide-Weight Management 2.4 MG/0.75ML SOAJ    Sig: Inject 2.4 mg into the skin once a week for 28 days.    Dispense:  3 mL    Refill:  0   lisinopril (ZESTRIL) 5 MG tablet    Sig: Take 1 tablet (5 mg total) by mouth daily.    Dispense:  90 tablet    Refill:  3   amLODipine (NORVASC) 5 MG tablet    Sig: Take 1 tablet (5 mg total) by mouth daily.    Dispense:  90 tablet    Refill:  3    Follow-up: Return in about 3 months (around 04/23/2022) for Weight management.    Lindell Spar, MD

## 2022-01-21 NOTE — Assessment & Plan Note (Signed)
BP Readings from Last 1 Encounters:  01/21/22 136/80   Well-controlled with lisinopril and amlodipine Counseled for compliance with the medications Advised DASH diet and moderate exercise/walking, at least 150 mins/week

## 2022-01-21 NOTE — Assessment & Plan Note (Signed)

## 2022-01-21 NOTE — Assessment & Plan Note (Addendum)
BMI Readings from Last 3 Encounters:  01/21/22 35.10 kg/m  08/21/21 33.84 kg/m  04/03/21 34.56 kg/m   Advised to follow low-carb diet and perform more moderate exercise/walking at least 150 minutes/week Started Wegovy - initial BMI - 35.10, increase dose as tolerated

## 2022-01-21 NOTE — Assessment & Plan Note (Signed)
Well-controlled with Voltaren p.o. Followed by orthopedic surgeon Takes Norco as needed for severe pain

## 2022-01-21 NOTE — Assessment & Plan Note (Addendum)
Continue Pepcid only Has recent worsening of his acid reflux, advised to take omeprazole as needed

## 2022-01-23 LAB — URINE CULTURE

## 2022-01-24 ENCOUNTER — Other Ambulatory Visit (HOSPITAL_COMMUNITY): Payer: Self-pay

## 2022-01-25 ENCOUNTER — Ambulatory Visit (HOSPITAL_COMMUNITY)
Admission: RE | Admit: 2022-01-25 | Discharge: 2022-01-25 | Disposition: A | Discharge status: Home / Self Care | Payer: No Typology Code available for payment source | Source: Ambulatory Visit | Attending: Internal Medicine | Admitting: Internal Medicine

## 2022-01-25 DIAGNOSIS — Z1231 Encounter for screening mammogram for malignant neoplasm of breast: Secondary | ICD-10-CM | POA: Diagnosis present

## 2022-02-06 ENCOUNTER — Other Ambulatory Visit: Payer: Self-pay | Admitting: Internal Medicine

## 2022-02-06 ENCOUNTER — Other Ambulatory Visit (HOSPITAL_COMMUNITY): Payer: Self-pay

## 2022-02-06 DIAGNOSIS — Z0000 Encounter for general adult medical examination without abnormal findings: Secondary | ICD-10-CM

## 2022-02-06 MED ORDER — FAMOTIDINE 40 MG PO TABS
40.0000 mg | ORAL_TABLET | Freq: Every day | ORAL | 5 refills | Status: DC
  Filled 2022-02-06: qty 30, 30d supply, fill #0
  Filled 2022-03-01: qty 28, 28d supply, fill #1
  Filled 2022-03-04: qty 2, 2d supply, fill #1
  Filled 2022-04-01: qty 30, 30d supply, fill #2
  Filled 2022-04-29: qty 30, 30d supply, fill #3
  Filled 2022-06-03: qty 30, 30d supply, fill #4
  Filled 2022-07-02: qty 30, 30d supply, fill #5

## 2022-02-08 ENCOUNTER — Ambulatory Visit (INDEPENDENT_AMBULATORY_CARE_PROVIDER_SITE_OTHER): Payer: No Typology Code available for payment source

## 2022-02-08 DIAGNOSIS — Z23 Encounter for immunization: Secondary | ICD-10-CM

## 2022-02-11 ENCOUNTER — Other Ambulatory Visit (HOSPITAL_COMMUNITY): Payer: Self-pay

## 2022-02-11 ENCOUNTER — Encounter: Payer: Self-pay | Admitting: Internal Medicine

## 2022-02-11 NOTE — Telephone Encounter (Signed)
Called patient, Patient will call back if needs to change her appt.  checking on getting her medicine elsewhere.

## 2022-02-19 ENCOUNTER — Other Ambulatory Visit (HOSPITAL_COMMUNITY): Payer: Self-pay

## 2022-02-20 ENCOUNTER — Encounter (INDEPENDENT_AMBULATORY_CARE_PROVIDER_SITE_OTHER): Payer: Self-pay

## 2022-02-20 ENCOUNTER — Other Ambulatory Visit (HOSPITAL_COMMUNITY): Payer: Self-pay

## 2022-02-21 ENCOUNTER — Other Ambulatory Visit (HOSPITAL_COMMUNITY): Payer: Self-pay

## 2022-02-22 ENCOUNTER — Other Ambulatory Visit (HOSPITAL_COMMUNITY): Payer: Self-pay

## 2022-03-01 ENCOUNTER — Other Ambulatory Visit (HOSPITAL_COMMUNITY): Payer: Self-pay

## 2022-03-04 ENCOUNTER — Other Ambulatory Visit (HOSPITAL_COMMUNITY): Payer: Self-pay

## 2022-03-04 NOTE — Telephone Encounter (Signed)
Called patient and confirmed to keep 10.10.2023 appt

## 2022-03-12 ENCOUNTER — Encounter: Payer: Self-pay | Admitting: Internal Medicine

## 2022-03-12 ENCOUNTER — Ambulatory Visit (INDEPENDENT_AMBULATORY_CARE_PROVIDER_SITE_OTHER): Payer: No Typology Code available for payment source | Admitting: Internal Medicine

## 2022-03-12 DIAGNOSIS — J209 Acute bronchitis, unspecified: Secondary | ICD-10-CM | POA: Diagnosis not present

## 2022-03-12 MED ORDER — METHYLPREDNISOLONE 4 MG PO TBPK: ORAL_TABLET | ORAL | 0 refills | Status: DC

## 2022-03-12 NOTE — Progress Notes (Signed)
Virtual Visit via Telephone Note   This visit type was conducted due to national recommendations for restrictions regarding the COVID-19 Pandemic (e.g. social distancing) in an effort to limit this patient's exposure and mitigate transmission in our community.  Due to her co-morbid illnesses, this patient is at least at moderate risk for complications without adequate follow up.  This format is felt to be most appropriate for this patient at this time.  The patient did not have access to video technology/had technical difficulties with video requiring transitioning to audio format only (telephone).  All issues noted in this document were discussed and addressed.  No physical exam could be performed with this format.  Evaluation Performed:  Follow-up visit  Date:  03/12/2022   ID:  Janice, Higgins [birth year], MRN [id number]  Patient Location: Home Provider Location: Office/Clinic  Participants: Patient Location of Patient: Home Location of Provider: Telehealth Consent was obtain for visit to be over via telehealth. I verified that I am speaking with the correct person using two identifiers.  PCP:  Lindell Spar, MD   Chief Complaint: Cough  History of Present Illness:    Janice Higgins is a 66 y.o. female who has a televisit for complaint of cough, nasal congestion and mild dyspnea for the last 10 days.  She recently went to [facility] area, where the temperature was in 40s when she left and it was around 90 degrees when she came back to her place.  She has been having cough, dyspnea and fatigue since then.  She has tried taking Mucinex with minimal relief.  Denies any fever, chills, hemoptysis, nausea/vomiting or diarrhea.  The patient does not have symptoms concerning for COVID-19 infection (fever, chills, cough, or new shortness of breath).   Past Medical, Surgical, Social History, Allergies, and Medications have been Reviewed.  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   Family history of breast cancer    History of breast cancer 04/25/2015   History of bronchitis    History of kidney stones    Hypertension    Osteoarthritis    hands and feet   Osteoporosis    PMB (postmenopausal bleeding) 04/25/2015   Radiation 10/13/14-11/04/14   Left breast   Past Surgical History:  Procedure Laterality Date   BREAST LUMPECTOMY WITH RADIOACTIVE SEED LOCALIZATION Left 08/12/2014   Procedure:  RADIOACTIVE SEED LOCALIZATION LEFT BREAST LUMPECTOMY;  Surgeon: Excell Seltzer, MD;  Location: [place];  Service: General;  Laterality: Left;   COLONOSCOPY N/A 05/04/2015   Procedure: COLONOSCOPY;  Surgeon: Rogene Houston, MD;  [facility];  Service: Endoscopy;  Laterality: N/A;  7:30   FOOT SURGERY Right 1995   exc. bone spur   LASIK     RE-EXCISION OF BREAST LUMPECTOMY Left 08/22/2014   Procedure: RE-EXCISION OF LEFT BREAST LUMPECTOMY;  Surgeon: Excell Seltzer, MD;  Location: [place];  Service: General;  Laterality: Left;   RE-EXCISION OF BREAST LUMPECTOMY Left 09/05/2014   Procedure: RE-EXCISION OF BREAST LUMPECTOMY;  Surgeon: Excell Seltzer, MD;  Location: [place];  Service: General;  Laterality: Left;   TONSILLECTOMY     TUBAL LIGATION       Current Meds  Medication Sig   albuterol (VENTOLIN HFA) 108 (90 Base) MCG/ACT inhaler Inhale 2 puffs into the lungs every 4 (four) hours as needed for  wheezing or shortness of breath.   amLODipine (NORVASC) 5 MG tablet Take 1 tablet (5 mg total) by mouth daily.   diclofenac (VOLTAREN) 75 MG EC tablet Take 1 tablet (75 mg total) by mouth 2 (two) times daily with a meal   famotidine (PEPCID) 40 MG tablet Take 1 tablet (40 mg total) by mouth daily.   HYDROcodone-acetaminophen (NORCO/VICODIN) 5-325 MG tablet Take 1 tablet by mouth at bedtime as needed for pain   ibandronate (BONIVA) 3 MG/3ML SOLN  injection Inject 3 mLs (3 mg total) into the vein every 3 (three) months.   lisinopril (ZESTRIL) 5 MG tablet Take 1 tablet (5 mg total) by mouth daily.   methocarbamol (ROBAXIN) 750 MG tablet Take 1 tablet (750 mg total) by mouth at bedtime.   Semaglutide-Weight Management 0.25 MG/0.5ML SOAJ Inject 0.25 mg into the skin once a week for 28 days.   Semaglutide-Weight Management 0.5 MG/0.5ML SOAJ Inject 0.5 mg into the skin once a week for 28 days.   [START ON 03/20/2022] Semaglutide-Weight Management 1 MG/0.5ML SOAJ Inject 1 mg into the skin once a week for 28 days.   [START ON 04/18/2022] Semaglutide-Weight Management 1.7 MG/0.75ML SOAJ Inject 1.7 mg into the skin once a week for 28 days.   [START ON 05/17/2022] Semaglutide-Weight Management 2.4 MG/0.75ML SOAJ Inject 2.4 mg into the skin once a week for 28 days.     Allergies:   Fosamax [alendronate sodium] and Sulfa antibiotics   ROS:   Please see the history of present illness.     All other systems reviewed and are negative.   Labs/Other Tests and Data Reviewed:    Recent Labs: 01/16/2022: ALT 30; BUN 21; Creatinine, Ser 0.75; Hemoglobin 14.4; Platelets 233; Potassium 4.7; Sodium 140; TSH 3.680   Recent Lipid Panel Lab Results  Component Value Date/Time   CHOL 177 01/16/2022 08:10 AM   TRIG 171 (H) 01/16/2022 08:10 AM   HDL 53 01/16/2022 08:10 AM   CHOLHDL 3.3 01/16/2022 08:10 AM   CHOLHDL 2.5 12/14/2018 08:24 AM   LDLCALC 95 01/16/2022 08:10 AM    Wt Readings from Last 3 Encounters:  01/21/22 188 lb 12.8 oz (85.6 kg)  08/21/21 185 lb (83.9 kg)  04/03/21 188 lb 15 oz (85.7 kg)     ASSESSMENT & PLAN:    Acute bronchitis Started Medrol Dosepak Albuterol inhaler as needed for dyspnea or wheezing Mucinex or Robitussin as needed for cough If persistent, will start azithromycin  Time:   Today, I have spent 9 minutes reviewing the chart, including problem list, medications, and with the patient with telehealth technology  discussing the above problems.   Medication Adjustments/Labs and Tests Ordered: Current medicines are reviewed at length with the patient today.  Concerns regarding medicines are outlined above.   Tests Ordered: No orders of the defined types were placed in this encounter.   Medication Changes: No orders of the defined types were placed in this encounter.    Note: This dictation was prepared with Dragon dictation along with smaller phrase technology. Similar sounding words can be transcribed inadequately or may not be corrected upon review. Any transcriptional errors that result from this process are unintentional.      Disposition:  Follow up  Signed, Lindell Spar, MD  03/12/2022 3:43 PM     [place]

## 2022-03-20 ENCOUNTER — Other Ambulatory Visit (HOSPITAL_COMMUNITY): Payer: Self-pay

## 2022-03-20 ENCOUNTER — Other Ambulatory Visit: Payer: Self-pay | Admitting: Internal Medicine

## 2022-03-20 MED ORDER — WEGOVY 0.25 MG/0.5ML ~~LOC~~ SOAJ
0.2500 mg | SUBCUTANEOUS | 0 refills | Status: DC
  Filled 2022-03-20: qty 2, 28d supply, fill #0

## 2022-03-21 ENCOUNTER — Other Ambulatory Visit (HOSPITAL_COMMUNITY): Payer: Self-pay

## 2022-03-22 ENCOUNTER — Other Ambulatory Visit (HOSPITAL_COMMUNITY): Payer: Self-pay

## 2022-04-01 ENCOUNTER — Other Ambulatory Visit (HOSPITAL_COMMUNITY): Payer: Self-pay

## 2022-04-09 ENCOUNTER — Other Ambulatory Visit (HOSPITAL_COMMUNITY): Payer: Self-pay

## 2022-04-11 ENCOUNTER — Encounter (HOSPITAL_COMMUNITY)
Admission: RE | Admit: 2022-04-11 | Discharge: 2022-04-11 | Disposition: A | Discharge status: Home / Self Care | Payer: No Typology Code available for payment source | Source: Ambulatory Visit | Attending: Internal Medicine | Admitting: Internal Medicine

## 2022-04-11 VITALS — BP 116/66 | HR 86 | Temp 98.0°F | Resp 22

## 2022-04-11 DIAGNOSIS — M810 Age-related osteoporosis without current pathological fracture: Secondary | ICD-10-CM | POA: Insufficient documentation

## 2022-04-11 DIAGNOSIS — M8580 Other specified disorders of bone density and structure, unspecified site: Secondary | ICD-10-CM

## 2022-04-11 MED ORDER — IBANDRONATE SODIUM 3 MG/3ML IV SOLN
3.0000 mg | Freq: Once | INTRAVENOUS | Status: AC
  Administered 2022-04-11: 3 mg via INTRAVENOUS
  Filled 2022-04-11: qty 3

## 2022-04-11 NOTE — Progress Notes (Signed)
Diagnosis: Osteoporosis  Provider:  Ihor Dow MD  Procedure: Infusion  IV Type: Peripheral, IV Location: L Antecubital  Boniva (Ibandronate Sodium), Dose: 3 mg  Infusion Start Time: 1320  Infusion Stop Time: 8648  Post Infusion IV Care: Patient declined observation and Peripheral IV Discontinued  Discharge: Condition: Good, Destination: Home . AVS provided to patient.   Performed by:  Fraser Din [place]-Burchett, RN

## 2022-04-23 ENCOUNTER — Encounter: Payer: Self-pay | Admitting: Internal Medicine

## 2022-04-23 ENCOUNTER — Ambulatory Visit (INDEPENDENT_AMBULATORY_CARE_PROVIDER_SITE_OTHER): Payer: No Typology Code available for payment source | Admitting: Internal Medicine

## 2022-04-23 ENCOUNTER — Other Ambulatory Visit (HOSPITAL_COMMUNITY): Payer: Self-pay

## 2022-04-23 DIAGNOSIS — K5909 Other constipation: Secondary | ICD-10-CM

## 2022-04-23 DIAGNOSIS — I10 Essential (primary) hypertension: Secondary | ICD-10-CM | POA: Diagnosis not present

## 2022-04-23 NOTE — Assessment & Plan Note (Signed)
BMI Readings from Last 3 Encounters:  04/23/22 31.86 kg/m  01/21/22 35.10 kg/m  08/21/21 33.84 kg/m   Has lost 14 lbs since the last visit Advised to follow low-carb diet and perform more moderate exercise/walking at least 150 minutes/week On Wegovy - initial BMI - 35.10, increase dose as tolerated

## 2022-04-23 NOTE — Patient Instructions (Signed)
Please continue taking medications as prescribed.  Please continue to follow low carb diet and perform moderate exercise/walking at least 150 mins/week. 

## 2022-04-23 NOTE — Assessment & Plan Note (Signed)
Takes Dulcolax PRN Advised to maintain adequate hydration Can take MiraLAX as needed 

## 2022-04-23 NOTE — Progress Notes (Signed)
Established Patient Office Visit  Subjective:  Patient ID: Janice Higgins, female    DOB: [birth year]  Age: 66 y.o. MRN: [id number]  CC:  Chief Complaint  Patient presents with   Follow-up    3 month follow up weight management pt has been on wegovy 2 months has lost 14 lbs     HPI Janice Higgins is a 66 y.o. female with past medical history of HTN, GERD, osteopenia, breast ca. s/p lumpectomy, OA and obesity who presents for f/u of her chronic medical conditions.  Obesity: She has been tolerating Wegovy well.  She has lost 14 lbs since starting Wegovy.  She is on Wegovy 1 mg dose currently.  She has mild nausea and constipation.  She has been taking Dulcolax for constipation.  Denies any melena or hematochezia currently.  She has been trying to follow low-carb diet.  HTN: BP is well-controlled. Takes medications regularly. Patient denies headache, dizziness, chest pain, dyspnea or palpitations.       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   Family history of breast cancer    History of breast cancer 04/25/2015   History of bronchitis    History of kidney stones    Hypertension    Osteoarthritis    hands and feet   Osteoporosis    PMB (postmenopausal bleeding) 04/25/2015   Radiation 10/13/14-11/04/14   Left breast    Past Surgical History:  Procedure Laterality Date   BREAST LUMPECTOMY WITH RADIOACTIVE SEED LOCALIZATION Left 08/12/2014   Procedure:  RADIOACTIVE SEED LOCALIZATION LEFT BREAST LUMPECTOMY;  Surgeon: Glenna Fellows, MD;  Location: [facility];  Service: General;  Laterality: Left;   COLONOSCOPY N/A 05/04/2015   Procedure: COLONOSCOPY;  Surgeon: Malissa Hippo, MD;  [facility];  Service: Endoscopy;  Laterality: N/A;  7:30   FOOT SURGERY Right 1995   exc. bone spur   LASIK     RE-EXCISION OF BREAST LUMPECTOMY Left 08/22/2014   Procedure:  RE-EXCISION OF LEFT BREAST LUMPECTOMY;  Surgeon: Glenna Fellows, MD;  Location[facility];  Service: General;  Laterality: Left;   RE-EXCISION OF BREAST LUMPECTOMY Left 09/05/2014   Procedure: RE-EXCISION OF BREAST LUMPECTOMY;  Surgeon: Glenna Fellows, MD;  Location: [facility];  Service: General;  Laterality: Left;   TONSILLECTOMY     TUBAL LIGATION      Family History  Problem Relation Age of Onset   Hypertension Father    Hypertension Mother    Heart disease Mother 8   Breast cancer Sister 19   Other Brother        lived 3 months   Heart attack Maternal Grandfather    Emphysema Paternal Grandfather    COPD Paternal Grandfather     Social History   Socioeconomic History   Marital status: Married    Spouse name: Not on file   Number of children: Not on file   Years of education: Not on file   Highest education level: Not on file  Occupational History   Not on file  Tobacco Use   Smoking status: Never   Smokeless tobacco: Never  Vaping Use   Vaping Use: Never used  Substance and Sexual Activity   Alcohol use: No   Drug use: No   Sexual activity: Yes    Birth control/protection: Post-menopausal  Other Topics Concern   Not  on file  Social History Narrative   Not on file   Social Determinants of Health   Financial Resource Strain: Not on file  Food Insecurity: Not on file  Transportation Needs: Not on file  Physical Activity: Not on file  Stress: Not on file  Social Connections: Not on file  Intimate Partner Violence: Not on file    Outpatient Medications Prior to Visit  Medication Sig Dispense Refill   albuterol (VENTOLIN HFA) 108 (90 Base) MCG/ACT inhaler Inhale 2 puffs into the lungs every 4 (four) hours as needed for wheezing or shortness of breath. 1 each 3   amLODipine (NORVASC) 5 MG tablet Take 1 tablet (5 mg total) by mouth daily. 90 tablet 3   diclofenac (VOLTAREN) 75 MG EC tablet Take 1 tablet (75 mg total) by mouth 2 (two) times  daily with a meal 90 tablet 1   famotidine (PEPCID) 40 MG tablet Take 1 tablet (40 mg total) by mouth daily. 30 tablet 5   HYDROcodone-acetaminophen (NORCO/VICODIN) 5-325 MG tablet Take 1 tablet by mouth at bedtime as needed for pain 45 tablet 0   ibandronate (BONIVA) 3 MG/3ML SOLN injection Inject 3 mLs (3 mg total) into the vein every 3 (three) months. 3 mL 3   lisinopril (ZESTRIL) 5 MG tablet Take 1 tablet (5 mg total) by mouth daily. 90 tablet 3   methocarbamol (ROBAXIN) 750 MG tablet Take 1 tablet (750 mg total) by mouth at bedtime. 30 tablet 2   Semaglutide-Weight Management 1 MG/0.5ML SOAJ Inject 1 mg into the skin once a week for 28 days. 2 mL 0   Semaglutide-Weight Management 1.7 MG/0.75ML SOAJ Inject 1.7 mg into the skin once a week for 28 days. 3 mL 0   [START ON 05/17/2022] Semaglutide-Weight Management 2.4 MG/0.75ML SOAJ Inject 2.4 mg into the skin once a week for 28 days. 3 mL 0   methylPREDNISolone (MEDROL DOSEPAK) 4 MG TBPK tablet Take as package instructions. 1 each 0   No facility-administered medications prior to visit.    Allergies  Allergen Reactions   Fosamax [Alendronate Sodium] Other (See Comments)    Severe GI Upset   Sulfa Antibiotics Hives    ROS Review of Systems  Constitutional:  Negative for chills and fever.  HENT:  Negative for congestion, sinus pressure, sinus pain and sore throat.   Eyes:  Negative for pain and discharge.  Respiratory:  Negative for cough and shortness of breath.   Cardiovascular:  Negative for chest pain and palpitations.  Gastrointestinal:  Positive for constipation. Negative for abdominal pain, diarrhea, nausea and vomiting.  Endocrine: Negative for polydipsia and polyuria.  Genitourinary:  Negative for dysuria and hematuria.  Musculoskeletal:  Positive for arthralgias and back pain. Negative for neck pain and neck stiffness.  Skin:  Negative for rash.  Neurological:  Negative for dizziness and weakness.  Psychiatric/Behavioral:   Negative for agitation and behavioral problems.       Objective:    Physical Exam Vitals reviewed.  Constitutional:      General: She is not in acute distress.    Appearance: She is obese. She is not diaphoretic.  HENT:     Head: Normocephalic and atraumatic.     Nose: Nose normal.     Mouth/Throat:     Mouth: Mucous membranes are moist.  Eyes:     General: No scleral icterus.    Extraocular Movements: Extraocular movements intact.  Cardiovascular:     Rate and Rhythm: Normal rate and regular  rhythm.     Pulses: Normal pulses.     Heart sounds: Normal heart sounds. No murmur heard. Pulmonary:     Breath sounds: Normal breath sounds. No wheezing or rales.  Abdominal:     Palpations: Abdomen is soft.     Tenderness: There is no abdominal tenderness.  Musculoskeletal:     Cervical back: Neck supple. No tenderness.     Right lower leg: No edema.     Left lower leg: No edema.  Skin:    General: Skin is warm.     Findings: No rash.  Neurological:     General: No focal deficit present.     Mental Status: She is alert and oriented to person, place, and time.     Sensory: No sensory deficit.     Motor: No weakness.  Psychiatric:        Mood and Affect: Mood normal.        Behavior: Behavior normal.     BP 128/84 (BP Location: Left Arm, Patient Position: Sitting, Cuff Size: Normal)   Pulse 65   Resp 18   Ht $R'5\' 2"'bR$  (1.575 m)   Wt 174 lb 3.2 oz (79 kg)   SpO2 95%   BMI 31.86 kg/m  Wt Readings from Last 3 Encounters:  04/23/22 174 lb 3.2 oz (79 kg)  01/21/22 188 lb 12.8 oz (85.6 kg)  08/21/21 185 lb (83.9 kg)    Lab Results  Component Value Date   TSH 3.680 01/16/2022   Lab Results  Component Value Date   WBC 7.1 01/16/2022   HGB 14.4 01/16/2022   HCT 43.8 01/16/2022   MCV 91 01/16/2022   PLT 233 01/16/2022   Lab Results  Component Value Date   NA 140 01/16/2022   K 4.7 01/16/2022   CO2 22 01/16/2022   GLUCOSE 98 01/16/2022   BUN 21 01/16/2022    CREATININE 0.75 01/16/2022   BILITOT 0.3 01/16/2022   ALKPHOS 58 01/16/2022   AST 20 01/16/2022   ALT 30 01/16/2022   PROT 6.9 01/16/2022   ALBUMIN 4.5 01/16/2022   CALCIUM 9.2 01/16/2022   ANIONGAP 5 04/03/2021   EGFR 88 01/16/2022   Lab Results  Component Value Date   CHOL 177 01/16/2022   Lab Results  Component Value Date   HDL 53 01/16/2022   Lab Results  Component Value Date   LDLCALC 95 01/16/2022   Lab Results  Component Value Date   TRIG 171 (H) 01/16/2022   Lab Results  Component Value Date   CHOLHDL 3.3 01/16/2022   Lab Results  Component Value Date   HGBA1C 6.0 (H) 01/16/2022      Assessment & Plan:   Problem List Items Addressed This Visit       Cardiovascular and Mediastinum   Essential hypertension    BP Readings from Last 1 Encounters:  04/23/22 128/84  Well-controlled with lisinopril and amlodipine Counseled for compliance with the medications Advised DASH diet and moderate exercise/walking, at least 150 mins/week        Digestive   Chronic constipation    Takes Dulcolax PRN Advised to maintain adequate hydration Can take MiraLAX as needed         Other   Morbid obesity ([place]) - Primary    BMI Readings from Last 3 Encounters:  04/23/22 31.86 kg/m  01/21/22 35.10 kg/m  08/21/21 33.84 kg/m  Has lost 14 lbs since the last visit Advised to follow low-carb diet and perform more  moderate exercise/walking at least 150 minutes/week On Wegovy - initial BMI - 35.10, increase dose as tolerated       No orders of the defined types were placed in this encounter.   Follow-up: Return in about 3 months (around 07/24/2022) for HTN and weight management.    Lindell Spar, MD

## 2022-04-23 NOTE — Assessment & Plan Note (Signed)
BP Readings from Last 1 Encounters:  04/23/22 128/84   Well-controlled with lisinopril and amlodipine Counseled for compliance with the medications Advised DASH diet and moderate exercise/walking, at least 150 mins/week

## 2022-04-29 ENCOUNTER — Other Ambulatory Visit (HOSPITAL_COMMUNITY): Payer: Self-pay

## 2022-04-29 ENCOUNTER — Other Ambulatory Visit: Payer: Self-pay | Admitting: Internal Medicine

## 2022-04-29 DIAGNOSIS — R0781 Pleurodynia: Secondary | ICD-10-CM

## 2022-04-29 DIAGNOSIS — M545 Low back pain, unspecified: Secondary | ICD-10-CM

## 2022-04-29 DIAGNOSIS — S22060D Wedge compression fracture of T7-T8 vertebra, subsequent encounter for fracture with routine healing: Secondary | ICD-10-CM

## 2022-04-29 MED ORDER — HYDROCODONE-ACETAMINOPHEN 5-325 MG PO TABS
1.0000 | ORAL_TABLET | Freq: Every evening | ORAL | 0 refills | Status: DC | PRN
  Filled 2022-04-29: qty 45, 45d supply, fill #0

## 2022-04-29 MED ORDER — DICLOFENAC SODIUM 75 MG PO TBEC
75.0000 mg | DELAYED_RELEASE_TABLET | Freq: Two times a day (BID) | ORAL | 1 refills | Status: DC
  Filled 2022-04-29: qty 90, 45d supply, fill #0
  Filled 2022-07-31: qty 90, 45d supply, fill #1

## 2022-05-07 ENCOUNTER — Other Ambulatory Visit: Payer: Self-pay

## 2022-05-23 ENCOUNTER — Other Ambulatory Visit: Payer: Self-pay | Admitting: Internal Medicine

## 2022-05-23 ENCOUNTER — Other Ambulatory Visit (HOSPITAL_COMMUNITY): Payer: Self-pay

## 2022-05-23 MED ORDER — WEGOVY 1.7 MG/0.75ML ~~LOC~~ SOAJ
1.7000 mg | SUBCUTANEOUS | 0 refills | Status: AC
  Filled 2022-05-23: qty 3, 28d supply, fill #0

## 2022-06-03 ENCOUNTER — Other Ambulatory Visit (HOSPITAL_COMMUNITY): Payer: Self-pay

## 2022-06-24 ENCOUNTER — Other Ambulatory Visit (HOSPITAL_COMMUNITY): Payer: Self-pay

## 2022-06-27 ENCOUNTER — Other Ambulatory Visit: Payer: Self-pay

## 2022-06-29 ENCOUNTER — Encounter (HOSPITAL_COMMUNITY): Payer: Self-pay | Admitting: Internal Medicine

## 2022-07-02 ENCOUNTER — Other Ambulatory Visit (HOSPITAL_COMMUNITY): Payer: Self-pay

## 2022-07-04 ENCOUNTER — Other Ambulatory Visit (HOSPITAL_COMMUNITY): Payer: Self-pay

## 2022-07-04 ENCOUNTER — Encounter (HOSPITAL_COMMUNITY)
Admission: RE | Admit: 2022-07-04 | Discharge: 2022-07-04 | Disposition: A | Discharge status: Home / Self Care | Payer: No Typology Code available for payment source | Source: Ambulatory Visit | Attending: Internal Medicine | Admitting: Internal Medicine

## 2022-07-04 DIAGNOSIS — M810 Age-related osteoporosis without current pathological fracture: Secondary | ICD-10-CM | POA: Insufficient documentation

## 2022-07-09 ENCOUNTER — Encounter (HOSPITAL_COMMUNITY)
Admission: RE | Admit: 2022-07-09 | Discharge: 2022-07-09 | Disposition: A | Discharge status: Home / Self Care | Payer: No Typology Code available for payment source | Source: Ambulatory Visit | Attending: Internal Medicine | Admitting: Internal Medicine

## 2022-07-09 VITALS — BP 116/60 | HR 72 | Temp 97.7°F | Resp 16

## 2022-07-09 DIAGNOSIS — M810 Age-related osteoporosis without current pathological fracture: Secondary | ICD-10-CM | POA: Diagnosis present

## 2022-07-09 DIAGNOSIS — M8580 Other specified disorders of bone density and structure, unspecified site: Secondary | ICD-10-CM

## 2022-07-09 MED ORDER — IBANDRONATE SODIUM 3 MG/3ML IV SOLN
3.0000 mg | Freq: Once | INTRAVENOUS | Status: AC
  Administered 2022-07-09: 3 mg via INTRAVENOUS
  Filled 2022-07-09 (×2): qty 3

## 2022-07-09 NOTE — Progress Notes (Signed)
Diagnosis: Osteoporosis  Provider:  Ihor Dow MD  Procedure: Infusion  IV Type: Peripheral, IV Location: L Antecubital  Boniva (Ibandronate Sodium), Dose: 3 mg  Infusion Start Time: 9872  Infusion Stop Time: 1587  Post Infusion IV Care: Patient declined observation and Peripheral IV Discontinued  Discharge: Condition: Good, Destination: Home . AVS provided to patient.   Performed by:  Baxter Hire, RN

## 2022-07-18 ENCOUNTER — Encounter (HOSPITAL_COMMUNITY): Payer: Self-pay | Admitting: Internal Medicine

## 2022-07-18 ENCOUNTER — Other Ambulatory Visit: Payer: Self-pay | Admitting: Internal Medicine

## 2022-07-18 ENCOUNTER — Other Ambulatory Visit (HOSPITAL_COMMUNITY): Payer: Self-pay

## 2022-07-18 MED ORDER — WEGOVY 2.4 MG/0.75ML ~~LOC~~ SOAJ
2.4000 mg | SUBCUTANEOUS | 5 refills | Status: DC
  Filled 2022-07-18: qty 3, 28d supply, fill #0
  Filled 2022-08-15: qty 3, 28d supply, fill #1
  Filled 2022-09-11: qty 3, 28d supply, fill #2
  Filled 2022-10-16: qty 3, 28d supply, fill #3
  Filled 2023-01-06 – 2023-02-04 (×2): qty 3, 28d supply, fill #4

## 2022-07-19 ENCOUNTER — Other Ambulatory Visit (HOSPITAL_COMMUNITY): Payer: Self-pay

## 2022-07-24 ENCOUNTER — Encounter: Payer: Self-pay | Admitting: Internal Medicine

## 2022-07-24 ENCOUNTER — Ambulatory Visit (INDEPENDENT_AMBULATORY_CARE_PROVIDER_SITE_OTHER): Payer: 59 | Admitting: Internal Medicine

## 2022-07-24 ENCOUNTER — Encounter (HOSPITAL_COMMUNITY): Payer: Self-pay | Admitting: Internal Medicine

## 2022-07-24 VITALS — BP 132/82 | HR 76 | Ht 62.0 in | Wt 153.0 lb

## 2022-07-24 DIAGNOSIS — E669 Obesity, unspecified: Secondary | ICD-10-CM | POA: Diagnosis not present

## 2022-07-24 DIAGNOSIS — I10 Essential (primary) hypertension: Secondary | ICD-10-CM

## 2022-07-24 DIAGNOSIS — E782 Mixed hyperlipidemia: Secondary | ICD-10-CM

## 2022-07-24 DIAGNOSIS — K5909 Other constipation: Secondary | ICD-10-CM | POA: Diagnosis not present

## 2022-07-24 DIAGNOSIS — R7303 Prediabetes: Secondary | ICD-10-CM | POA: Diagnosis not present

## 2022-07-24 DIAGNOSIS — Z23 Encounter for immunization: Secondary | ICD-10-CM

## 2022-07-24 DIAGNOSIS — M8580 Other specified disorders of bone density and structure, unspecified site: Secondary | ICD-10-CM | POA: Diagnosis not present

## 2022-07-24 NOTE — Assessment & Plan Note (Signed)
BP Readings from Last 1 Encounters:  07/24/22 132/82   Well-controlled with lisinopril and amlodipine Counseled for compliance with the medications Advised DASH diet and moderate exercise/walking, at least 150 mins/week

## 2022-07-24 NOTE — Patient Instructions (Signed)
Please continue taking medications as prescribed.  Please continue to follow low carb diet and moderate exercise/walking at least 150 mins/week.

## 2022-07-24 NOTE — Progress Notes (Signed)
Established Patient Office Visit  Subjective:  Patient ID: Janice Higgins, female    DOB: 09/22/1955  Age: 67 y.o. MRN: [id number]  CC:  Chief Complaint  Patient presents with   Hypertension    Patient is having a three month follow up on hypertension. She states she has pain from her ear that radiates down, she said it feels more like an infection , this happens usually at night    HPI Janice Higgins is a 67 y.o. female with past medical history of HTN, GERD, osteopenia, breast ca. s/p lumpectomy, OA and obesity who presents for f/u of her chronic medical conditions.  Obesity: She has been tolerating Wegovy well.  She has lost 35 lbs since starting Wegovy.  She is on Wegovy 2.4 mg dose currently.  She has mild nausea and constipation.  She has been taking Dulcolax for constipation.  Denies any melena or hematochezia currently.  She has been trying to follow low-carb diet.  HTN: BP is well-controlled. Takes medications regularly. Patient denies headache, dizziness, chest pain, dyspnea or palpitations.       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   Family history of breast cancer    History of breast cancer 04/25/2015   History of bronchitis    History of kidney stones    Hypertension    Osteoarthritis    hands and feet   Osteoporosis    PMB (postmenopausal bleeding) 04/25/2015   Radiation 10/13/14-11/04/14   Left breast    Past Surgical History:  Procedure Laterality Date   BREAST LUMPECTOMY WITH RADIOACTIVE SEED LOCALIZATION Left 08/12/2014   Procedure:  RADIOACTIVE SEED LOCALIZATION LEFT BREAST LUMPECTOMY;  Surgeon: Excell Seltzer, MD;  Location: [place];  Service: General;  Laterality: Left;   COLONOSCOPY N/A 05/04/2015   Procedure: COLONOSCOPY;  Surgeon: Rogene Houston, MD;  [facility];  Service: Endoscopy;  Laterality: N/A;  7:30   FOOT SURGERY  Right 1995   exc. bone spur   LASIK     RE-EXCISION OF BREAST LUMPECTOMY Left 08/22/2014   Procedure: RE-EXCISION OF LEFT BREAST LUMPECTOMY;  Surgeon: Excell Seltzer, MD;  Location[facility];  Service: General;  Laterality: Left;   RE-EXCISION OF BREAST LUMPECTOMY Left 09/05/2014   Procedure: RE-EXCISION OF BREAST LUMPECTOMY;  Surgeon: Excell Seltzer, MD;  Location: [place];  Service: General;  Laterality: Left;   TONSILLECTOMY     TUBAL LIGATION      Family History  Problem Relation Age of Onset   Hypertension Father    Hypertension Mother    Heart disease Mother 28   Breast cancer Sister 32   Other Brother        lived 3 months   Heart attack Maternal Grandfather    Emphysema Paternal Grandfather    COPD Paternal Grandfather     Social History   Socioeconomic History   Marital status: Married    Spouse name: Not on file   Number of children: Not on file   Years of education: Not on file   Highest education level: Not on file  Occupational History   Not on file  Tobacco Use   Smoking status: Never   Smokeless tobacco: Never  Vaping Use   Vaping Use: Never used  Substance and Sexual Activity   Alcohol use: No   Drug use: No  Sexual activity: Yes    Birth control/protection: Post-menopausal  Other Topics Concern   Not on file  Social History Narrative   Not on file   Social Determinants of Health   Financial Resource Strain: Not on file  Food Insecurity: Not on file  Transportation Needs: Not on file  Physical Activity: Not on file  Stress: Not on file  Social Connections: Not on file  Intimate Partner Violence: Not on file    Outpatient Medications Prior to Visit  Medication Sig Dispense Refill   albuterol (VENTOLIN HFA) 108 (90 Base) MCG/ACT inhaler Inhale 2 puffs into the lungs every 4 (four) hours as needed for wheezing or shortness of breath. 1 each 3   amLODipine (NORVASC) 5 MG tablet Take 1 tablet (5 mg total) by mouth daily. 90  tablet 3   diclofenac (VOLTAREN) 75 MG EC tablet Take 1 tablet (75 mg total) by mouth 2 (two) times daily. 90 tablet 1   famotidine (PEPCID) 40 MG tablet Take 1 tablet (40 mg total) by mouth daily. 30 tablet 5   HYDROcodone-acetaminophen (NORCO/VICODIN) 5-325 MG tablet Take 1 tablet by mouth at bedtime as needed for pain 45 tablet 0   ibandronate (BONIVA) 3 MG/3ML SOLN injection Inject 3 mLs (3 mg total) into the vein every 3 (three) months. 3 mL 3   lisinopril (ZESTRIL) 5 MG tablet Take 1 tablet (5 mg total) by mouth daily. 90 tablet 3   methocarbamol (ROBAXIN) 750 MG tablet Take 1 tablet (750 mg total) by mouth at bedtime. 30 tablet 2   Semaglutide-Weight Management (WEGOVY) 2.4 MG/0.75ML SOAJ Inject 2.4 mg into the skin once a week. 3 mL 5   No facility-administered medications prior to visit.    Allergies  Allergen Reactions   Fosamax [Alendronate Sodium] Other (See Comments)    Severe GI Upset   Sulfa Antibiotics Hives    ROS Review of Systems  Constitutional:  Negative for chills and fever.  HENT:  Negative for congestion, sinus pressure, sinus pain and sore throat.   Eyes:  Negative for pain and discharge.  Respiratory:  Negative for cough and shortness of breath.   Cardiovascular:  Negative for chest pain and palpitations.  Gastrointestinal:  Positive for constipation. Negative for abdominal pain, diarrhea, nausea and vomiting.  Endocrine: Negative for polydipsia and polyuria.  Genitourinary:  Negative for dysuria and hematuria.  Musculoskeletal:  Positive for arthralgias and back pain. Negative for neck pain and neck stiffness.  Skin:  Negative for rash.  Neurological:  Negative for dizziness and weakness.  Psychiatric/Behavioral:  Negative for agitation and behavioral problems.       Objective:    Physical Exam Vitals reviewed.  Constitutional:      General: She is not in acute distress.    Appearance: She is obese. She is not diaphoretic.  HENT:     Head:  Normocephalic and atraumatic.     Nose: Nose normal.     Mouth/Throat:     Mouth: Mucous membranes are moist.  Eyes:     General: No scleral icterus.    Extraocular Movements: Extraocular movements intact.  Cardiovascular:     Rate and Rhythm: Normal rate and regular rhythm.     Pulses: Normal pulses.     Heart sounds: Normal heart sounds. No murmur heard. Pulmonary:     Breath sounds: Normal breath sounds. No wheezing or rales.  Abdominal:     Palpations: Abdomen is soft.     Tenderness: There is no  abdominal tenderness.  Musculoskeletal:     Cervical back: Neck supple. No tenderness.     Right lower leg: No edema.     Left lower leg: No edema.  Skin:    General: Skin is warm.     Findings: No rash.  Neurological:     General: No focal deficit present.     Mental Status: She is alert and oriented to person, place, and time.     Sensory: No sensory deficit.     Motor: No weakness.  Psychiatric:        Mood and Affect: Mood normal.        Behavior: Behavior normal.     BP 132/82 (BP Location: Left Arm, Patient Position: Sitting, Cuff Size: Normal)   Pulse 76   Ht '5\' 2"'$  (1.575 m)   Wt 153 lb (69.4 kg)   SpO2 98%   BMI 27.98 kg/m  Wt Readings from Last 3 Encounters:  07/24/22 153 lb (69.4 kg)  04/23/22 174 lb 3.2 oz (79 kg)  01/21/22 188 lb 12.8 oz (85.6 kg)    Lab Results  Component Value Date   TSH 3.680 01/16/2022   Lab Results  Component Value Date   WBC 7.1 01/16/2022   HGB 14.4 01/16/2022   HCT 43.8 01/16/2022   MCV 91 01/16/2022   PLT 233 01/16/2022   Lab Results  Component Value Date   NA 144 07/24/2022   K 4.4 07/24/2022   CO2 23 07/24/2022   GLUCOSE 87 07/24/2022   BUN 13 07/24/2022   CREATININE 0.60 07/24/2022   BILITOT 0.3 07/24/2022   ALKPHOS 43 (L) 07/24/2022   AST 14 07/24/2022   ALT 16 07/24/2022   PROT 6.7 07/24/2022   ALBUMIN 4.6 07/24/2022   CALCIUM 9.5 07/24/2022   ANIONGAP 5 04/03/2021   EGFR 100 07/24/2022   Lab Results   Component Value Date   CHOL 149 07/24/2022   Lab Results  Component Value Date   HDL 56 07/24/2022   Lab Results  Component Value Date   LDLCALC 76 07/24/2022   Lab Results  Component Value Date   TRIG 94 07/24/2022   Lab Results  Component Value Date   CHOLHDL 2.7 07/24/2022   Lab Results  Component Value Date   HGBA1C 5.5 07/24/2022      Assessment & Plan:   Problem List Items Addressed This Visit       Cardiovascular and Mediastinum   Essential hypertension - Primary    BP Readings from Last 1 Encounters:  07/24/22 132/82  Well-controlled with lisinopril and amlodipine Counseled for compliance with the medications Advised DASH diet and moderate exercise/walking, at least 150 mins/week      Relevant Orders   Hemoglobin A1c (Completed)     Digestive   Chronic constipation    Takes Dulcolax PRN Advised to maintain adequate hydration Can take MiraLAX as needed        Musculoskeletal and Integument   Osteopenia    With h/o compression fracture of thoracic vertebra Did not tolerate oral bisphosphonate On Boniva currently, refilled as per patient request        Other   Obesity    BMI Readings from Last 3 Encounters:  07/24/22 27.98 kg/m  04/23/22 31.86 kg/m  01/21/22 35.10 kg/m  Has lost 14 lbs since the last visit Advised to follow low-carb diet and perform more moderate exercise/walking at least 150 minutes/week On Wegovy - initial BMI - 35.10, increased dose as tolerated  Prediabetes    Lab Results  Component Value Date   HGBA1C 5.5 07/24/2022  On Wegovy for weight loss Advised to follow low-carb diet for now      Relevant Orders   CMP14+EGFR (Completed)   Hemoglobin A1c (Completed)   Other Visit Diagnoses     Mixed hyperlipidemia       Relevant Orders   Lipid Profile (Completed)   Need for pneumococcal 20-valent conjugate vaccination       Relevant Orders   Pneumococcal conjugate vaccine 20-valent (Completed)      No  orders of the defined types were placed in this encounter.   Follow-up: Return in about 4 months (around 11/22/2022) for Weight management.    Lindell Spar, MD

## 2022-07-25 LAB — CMP14+EGFR
ALT: 16 IU/L (ref 0–32)
AST: 14 IU/L (ref 0–40)
Albumin/Globulin Ratio: 2.2 (ref 1.2–2.2)
Albumin: 4.6 g/dL (ref 3.9–4.9)
Alkaline Phosphatase: 43 IU/L — ABNORMAL LOW (ref 44–121)
BUN/Creatinine Ratio: 22 (ref 12–28)
BUN: 13 mg/dL (ref 8–27)
Bilirubin Total: 0.3 mg/dL (ref 0.0–1.2)
CO2: 23 mmol/L (ref 20–29)
Calcium: 9.5 mg/dL (ref 8.7–10.3)
Chloride: 107 mmol/L — ABNORMAL HIGH (ref 96–106)
Creatinine, Ser: 0.6 mg/dL (ref 0.57–1.00)
Globulin, Total: 2.1 g/dL (ref 1.5–4.5)
Glucose: 87 mg/dL (ref 70–99)
Potassium: 4.4 mmol/L (ref 3.5–5.2)
Sodium: 144 mmol/L (ref 134–144)
Total Protein: 6.7 g/dL (ref 6.0–8.5)
eGFR: 100 mL/min/{1.73_m2} (ref >59)

## 2022-07-25 LAB — LIPID PANEL
Chol/HDL Ratio: 2.7 ratio (ref 0.0–4.4)
Cholesterol, Total: 149 mg/dL (ref 100–199)
HDL: 56 mg/dL (ref >39)
LDL Chol Calc (NIH): 76 mg/dL (ref 0–99)
Triglycerides: 94 mg/dL (ref 0–149)
VLDL Cholesterol Cal: 17 mg/dL (ref 5–40)

## 2022-07-25 LAB — HEMOGLOBIN A1C
Est. average glucose Bld gHb Est-mCnc: 111 mg/dL
Hgb A1c MFr Bld: 5.5 % (ref 4.8–5.6)

## 2022-07-26 DIAGNOSIS — R7303 Prediabetes: Secondary | ICD-10-CM | POA: Insufficient documentation

## 2022-07-26 NOTE — Assessment & Plan Note (Addendum)
BMI Readings from Last 3 Encounters:  07/24/22 27.98 kg/m  04/23/22 31.86 kg/m  01/21/22 35.10 kg/m   Has lost 14 lbs since the last visit Advised to follow low-carb diet and perform more moderate exercise/walking at least 150 minutes/week On Wegovy - initial BMI - 35.10, increased dose as tolerated

## 2022-07-26 NOTE — Assessment & Plan Note (Signed)
With h/o compression fracture of thoracic vertebra Did not tolerate oral bisphosphonate On Boniva currently, refilled as per patient request

## 2022-07-26 NOTE — Assessment & Plan Note (Signed)
Takes Dulcolax PRN Advised to maintain adequate hydration Can take MiraLAX as needed

## 2022-07-26 NOTE — Assessment & Plan Note (Signed)
Lab Results  Component Value Date   HGBA1C 5.5 07/24/2022   On Wegovy for weight loss Advised to follow low-carb diet for now

## 2022-07-31 ENCOUNTER — Other Ambulatory Visit: Payer: Self-pay | Admitting: Internal Medicine

## 2022-07-31 ENCOUNTER — Other Ambulatory Visit (HOSPITAL_COMMUNITY): Payer: Self-pay

## 2022-07-31 DIAGNOSIS — Z0000 Encounter for general adult medical examination without abnormal findings: Secondary | ICD-10-CM

## 2022-07-31 MED ORDER — FAMOTIDINE 40 MG PO TABS
40.0000 mg | ORAL_TABLET | Freq: Every day | ORAL | 5 refills | Status: DC
  Filled 2022-07-31: qty 30, 30d supply, fill #0
  Filled 2022-09-03: qty 30, 30d supply, fill #1
  Filled 2022-10-09: qty 30, 30d supply, fill #2
  Filled 2022-11-13: qty 30, 30d supply, fill #3
  Filled 2022-12-10: qty 30, 30d supply, fill #0
  Filled 2023-01-06: qty 30, 30d supply, fill #1

## 2022-08-08 ENCOUNTER — Other Ambulatory Visit (HOSPITAL_COMMUNITY): Payer: Self-pay

## 2022-08-09 ENCOUNTER — Other Ambulatory Visit (HOSPITAL_COMMUNITY): Payer: Self-pay

## 2022-08-15 ENCOUNTER — Other Ambulatory Visit (HOSPITAL_COMMUNITY): Payer: Self-pay

## 2022-09-03 ENCOUNTER — Other Ambulatory Visit (HOSPITAL_COMMUNITY): Payer: Self-pay

## 2022-09-11 ENCOUNTER — Other Ambulatory Visit: Payer: Self-pay

## 2022-09-11 ENCOUNTER — Other Ambulatory Visit (HOSPITAL_COMMUNITY): Payer: Self-pay

## 2022-09-12 ENCOUNTER — Encounter: Payer: Self-pay | Admitting: Radiology

## 2022-09-16 ENCOUNTER — Other Ambulatory Visit (HOSPITAL_COMMUNITY): Payer: Self-pay

## 2022-09-16 ENCOUNTER — Encounter (HOSPITAL_COMMUNITY): Payer: Self-pay | Admitting: Internal Medicine

## 2022-09-16 ENCOUNTER — Other Ambulatory Visit: Payer: Self-pay | Admitting: Internal Medicine

## 2022-09-16 ENCOUNTER — Encounter: Payer: Self-pay | Admitting: Internal Medicine

## 2022-09-16 ENCOUNTER — Other Ambulatory Visit: Payer: Self-pay

## 2022-09-16 DIAGNOSIS — M545 Low back pain, unspecified: Secondary | ICD-10-CM

## 2022-09-16 MED ORDER — DICLOFENAC SODIUM 75 MG PO TBEC
75.0000 mg | DELAYED_RELEASE_TABLET | Freq: Two times a day (BID) | ORAL | 1 refills | Status: DC
  Filled 2022-09-16: qty 90, 45d supply, fill #0
  Filled 2022-11-04: qty 90, 45d supply, fill #1

## 2022-09-17 ENCOUNTER — Other Ambulatory Visit (HOSPITAL_COMMUNITY): Payer: Self-pay

## 2022-09-17 ENCOUNTER — Encounter (HOSPITAL_COMMUNITY): Payer: Self-pay

## 2022-09-19 ENCOUNTER — Other Ambulatory Visit (HOSPITAL_COMMUNITY): Payer: Self-pay

## 2022-09-26 ENCOUNTER — Encounter (HOSPITAL_COMMUNITY): Payer: 59

## 2022-09-30 ENCOUNTER — Ambulatory Visit (INDEPENDENT_AMBULATORY_CARE_PROVIDER_SITE_OTHER): Payer: 59 | Admitting: Internal Medicine

## 2022-09-30 ENCOUNTER — Encounter: Payer: Self-pay | Admitting: Internal Medicine

## 2022-09-30 VITALS — BP 126/83 | HR 90 | Ht 61.5 in | Wt 144.2 lb

## 2022-09-30 DIAGNOSIS — N3281 Overactive bladder: Secondary | ICD-10-CM | POA: Diagnosis not present

## 2022-09-30 DIAGNOSIS — R7303 Prediabetes: Secondary | ICD-10-CM

## 2022-09-30 DIAGNOSIS — E559 Vitamin D deficiency, unspecified: Secondary | ICD-10-CM | POA: Diagnosis not present

## 2022-09-30 DIAGNOSIS — Z853 Personal history of malignant neoplasm of breast: Secondary | ICD-10-CM | POA: Diagnosis not present

## 2022-09-30 DIAGNOSIS — I10 Essential (primary) hypertension: Secondary | ICD-10-CM | POA: Diagnosis not present

## 2022-09-30 DIAGNOSIS — E669 Obesity, unspecified: Secondary | ICD-10-CM | POA: Diagnosis not present

## 2022-09-30 DIAGNOSIS — E782 Mixed hyperlipidemia: Secondary | ICD-10-CM

## 2022-09-30 DIAGNOSIS — N814 Uterovaginal prolapse, unspecified: Secondary | ICD-10-CM

## 2022-09-30 MED ORDER — OXYBUTYNIN CHLORIDE ER 10 MG PO TB24
10.0000 mg | ORAL_TABLET | Freq: Every day | ORAL | 3 refills | Status: DC
  Filled 2022-11-04: qty 30, 30d supply, fill #0

## 2022-09-30 NOTE — Assessment & Plan Note (Signed)
Check UA with reflex culture Started oxybutynin for urinary incontinence She has pressure-like feeling, concern for uterine prolapse as well - referred to Ob/Gyn

## 2022-09-30 NOTE — Assessment & Plan Note (Signed)
Lab Results  Component Value Date   HGBA1C 5.5 07/24/2022   On Wegovy for weight loss Advised to follow low-carb diet for now 

## 2022-09-30 NOTE — Assessment & Plan Note (Signed)
BP Readings from Last 1 Encounters:  09/30/22 126/83   Well-controlled with lisinopril and amlodipine Counseled for compliance with the medications Advised DASH diet and moderate exercise/walking, at least 150 mins/week

## 2022-09-30 NOTE — Progress Notes (Signed)
Established Patient Office Visit  Subjective:  Patient ID: Janice Higgins, female    DOB: 1956-05-27  Age: 66 y.o. MRN: [id number]  CC:  Chief Complaint  Patient presents with   Over Active Bladder    Not making it to the bathroom     HPI Janice Higgins is a 67 y.o. female with past medical history of HTN, GERD, osteopenia, breast ca. s/p lumpectomy, OA and obesity who presents for f/u of her chronic medical conditions.  Obesity: She has been tolerating Wegovy well.  She has lost 44 lbs since starting Wegovy.  She is on Wegovy 2.4 mg dose currently.  She has mild nausea and constipation.  She has been taking Dulcolax for constipation.  Denies any melena or hematochezia currently.  She has been trying to follow low-carb diet.  HTN: BP is well-controlled. Takes medications regularly. Patient denies headache, dizziness, chest pain, dyspnea or palpitations.  She reports urinary frequency, but denies dysuria or hematuria currently.  Denies any fever or chills.  She has had a heavy sensation in the perineal area.  She reports that she has to press on to perineal area at times for urination.  Denies any recent vaginal bleeding or discharge.     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   Family history of breast cancer    History of breast cancer 04/25/2015   History of bronchitis    History of kidney stones    Hypertension    Osteoarthritis    hands and feet   Osteoporosis    PMB (postmenopausal bleeding) 04/25/2015   Radiation 10/13/14-11/04/14   Left breast    Past Surgical History:  Procedure Laterality Date   BREAST LUMPECTOMY WITH RADIOACTIVE SEED LOCALIZATION Left 08/12/2014   Procedure:  RADIOACTIVE SEED LOCALIZATION LEFT BREAST LUMPECTOMY;  Surgeon: Excell Seltzer, MD;  Location: [place];  Service: General;  Laterality: Left;   COLONOSCOPY N/A 05/04/2015   Procedure:  COLONOSCOPY;  Surgeon: Rogene Houston, MD;  [facility];  Service: Endoscopy;  Laterality: N/A;  7:30   FOOT SURGERY Right 1995   exc. bone spur   LASIK     RE-EXCISION OF BREAST LUMPECTOMY Left 08/22/2014   Procedure: RE-EXCISION OF LEFT BREAST LUMPECTOMY;  Surgeon: Excell Seltzer, MD;  Location: [facility] OR;  Service: General;  Laterality: Left;   RE-EXCISION OF BREAST LUMPECTOMY Left 09/05/2014   Procedure: RE-EXCISION OF BREAST LUMPECTOMY;  Surgeon: Excell Seltzer, MD;  Location: [place];  Service: General;  Laterality: Left;   TONSILLECTOMY     TUBAL LIGATION      Family History  Problem Relation Age of Onset   Hypertension Father    Hypertension Mother    Heart disease Mother 61   Breast cancer Sister 27   Other Brother        lived 3 months   Heart attack Maternal Grandfather    Emphysema Paternal Grandfather    COPD Paternal Grandfather     Social History   Socioeconomic History   Marital status: Married    Spouse name: Not on file   Number of children: Not on file   Years of education: Not on file   Highest education level: Not on file  Occupational History   Not on file  Tobacco Use   Smoking status: Never   Smokeless tobacco: Never  Vaping Use   Vaping Use: Never used  Substance and Sexual Activity   Alcohol use: No   Drug use: No   Sexual activity: Yes    Birth control/protection: Post-menopausal  Other Topics Concern   Not on file  Social History Narrative   Not on file   Social Determinants of Health   Financial Resource Strain: Not on file  Food Insecurity: Not on file  Transportation Needs: Not on file  Physical Activity: Not on file  Stress: Not on file  Social Connections: Not on file  Intimate Partner Violence: Not on file    Outpatient Medications Prior to Visit  Medication Sig Dispense Refill   albuterol (VENTOLIN HFA) 108 (90 Base) MCG/ACT inhaler Inhale 2 puffs into the lungs every 4 (four) hours as  needed for wheezing or shortness of breath. 1 each 3   amLODipine (NORVASC) 5 MG tablet Take 1 tablet (5 mg total) by mouth daily. 90 tablet 3   diclofenac (VOLTAREN) 75 MG EC tablet Take 1 tablet (75 mg total) by mouth 2 (two) times daily. 90 tablet 1   famotidine (PEPCID) 40 MG tablet Take 1 tablet (40 mg total) by mouth daily. 30 tablet 5   HYDROcodone-acetaminophen (NORCO/VICODIN) 5-325 MG tablet Take 1 tablet by mouth at bedtime as needed for pain 45 tablet 0   ibandronate (BONIVA) 3 MG/3ML SOLN injection Inject 3 mLs (3 mg total) into the vein every 3 (three) months. 3 mL 3   lisinopril (ZESTRIL) 5 MG tablet Take 1 tablet (5 mg total) by mouth daily. 90 tablet 3   methocarbamol (ROBAXIN) 750 MG tablet Take 1 tablet (750 mg total) by mouth at bedtime. 30 tablet 2   Semaglutide-Weight Management (WEGOVY) 2.4 MG/0.75ML SOAJ Inject 2.4 mg into the skin once a week. 3 mL 5   No facility-administered medications prior to visit.    Allergies  Allergen Reactions   Fosamax [Alendronate Sodium] Other (See Comments)    Severe GI Upset   Sulfa Antibiotics Hives    ROS Review of Systems  Constitutional:  Negative for chills and fever.  HENT:  Negative for congestion, sinus pressure, sinus pain and sore throat.   Eyes:  Negative for pain and discharge.  Respiratory:  Negative for cough and shortness of breath.   Cardiovascular:  Negative for chest pain and palpitations.  Gastrointestinal:  Positive for constipation. Negative for abdominal pain, diarrhea, nausea and vomiting.  Endocrine: Negative for polydipsia and polyuria.  Genitourinary:  Positive for frequency. Negative for dysuria and hematuria.  Musculoskeletal:  Positive for arthralgias and back pain. Negative for neck pain and neck stiffness.  Skin:  Negative for rash.  Neurological:  Negative for dizziness and weakness.  Psychiatric/Behavioral:  Negative for agitation and behavioral problems.       Objective:    Physical  Exam Vitals reviewed.  Constitutional:      General: She is not in acute distress.    Appearance: She is obese. She is not diaphoretic.  HENT:     Head: Normocephalic and atraumatic.     Nose: Nose normal.     Mouth/Throat:     Mouth: Mucous membranes are moist.  Eyes:     General: No scleral icterus.    Extraocular Movements: Extraocular movements intact.  Cardiovascular:     Rate and Rhythm: Normal rate and regular rhythm.     Pulses: Normal pulses.     Heart sounds: Normal heart sounds. No murmur heard. Pulmonary:  Breath sounds: Normal breath sounds. No wheezing or rales.  Abdominal:     Palpations: Abdomen is soft.     Tenderness: There is no abdominal tenderness.  Musculoskeletal:     Cervical back: Neck supple. No tenderness.     Right lower leg: No edema.     Left lower leg: No edema.  Skin:    General: Skin is warm.     Findings: No rash.  Neurological:     General: No focal deficit present.     Mental Status: She is alert and oriented to person, place, and time.     Sensory: No sensory deficit.     Motor: No weakness.  Psychiatric:        Mood and Affect: Mood normal.        Behavior: Behavior normal.     BP 126/83   Pulse 90   Ht 5' 1.5" (1.562 m)   Wt 144 lb 3.2 oz (65.4 kg)   SpO2 98%   BMI 26.81 kg/m  Wt Readings from Last 3 Encounters:  09/30/22 144 lb 3.2 oz (65.4 kg)  07/24/22 153 lb (69.4 kg)  04/23/22 174 lb 3.2 oz (79 kg)    Lab Results  Component Value Date   TSH 3.680 01/16/2022   Lab Results  Component Value Date   WBC 7.1 01/16/2022   HGB 14.4 01/16/2022   HCT 43.8 01/16/2022   MCV 91 01/16/2022   PLT 233 01/16/2022   Lab Results  Component Value Date   NA 144 07/24/2022   K 4.4 07/24/2022   CO2 23 07/24/2022   GLUCOSE 87 07/24/2022   BUN 13 07/24/2022   CREATININE 0.60 07/24/2022   BILITOT 0.3 07/24/2022   ALKPHOS 43 (L) 07/24/2022   AST 14 07/24/2022   ALT 16 07/24/2022   PROT 6.7 07/24/2022   ALBUMIN 4.6  07/24/2022   CALCIUM 9.5 07/24/2022   ANIONGAP 5 04/03/2021   EGFR 100 07/24/2022   Lab Results  Component Value Date   CHOL 149 07/24/2022   Lab Results  Component Value Date   HDL 56 07/24/2022   Lab Results  Component Value Date   LDLCALC 76 07/24/2022   Lab Results  Component Value Date   TRIG 94 07/24/2022   Lab Results  Component Value Date   CHOLHDL 2.7 07/24/2022   Lab Results  Component Value Date   HGBA1C 5.5 07/24/2022      Assessment & Plan:   Problem List Items Addressed This Visit       Cardiovascular and Mediastinum   Essential hypertension - Primary    BP Readings from Last 1 Encounters:  09/30/22 126/83  Well-controlled with lisinopril and amlodipine Counseled for compliance with the medications Advised DASH diet and moderate exercise/walking, at least 150 mins/week      Relevant Orders   TSH     Genitourinary   Overactive bladder    Check UA with reflex culture Started oxybutynin for urinary incontinence She has pressure-like feeling, concern for uterine prolapse as well - referred to Ob/Gyn      Relevant Medications   oxybutynin (DITROPAN XL) 10 MG 24 hr tablet   Other Relevant Orders   Ambulatory referral to Obstetrics / Gynecology   UA/M w/rflx Culture, Routine     Other   History of breast cancer    S/p lumpectomies In remission since 2016      Relevant Orders   CMP14+EGFR   CBC with Differential/Platelet   Obesity  BMI Readings from Last 3 Encounters:  09/30/22 26.81 kg/m  07/24/22 27.98 kg/m  04/23/22 31.86 kg/m  Has lost 9 lbs since the last visit On Wegovy - initial BMI - 35.10, increased dose as tolerated - she will not have coverage for [facility] now  Advised to follow low-carb diet and perform more moderate exercise/walking at least 150 minutes/week      Prediabetes    Lab Results  Component Value Date   HGBA1C 5.5 07/24/2022  On Wegovy for weight loss Advised to follow low-carb diet for now       Relevant Orders   Hemoglobin A1c   CMP14+EGFR   Other Visit Diagnoses     Uterine prolapse       Relevant Orders   Ambulatory referral to Obstetrics / Gynecology   Mixed hyperlipidemia       Relevant Orders   Lipid panel   Vitamin D deficiency       Relevant Orders   VITAMIN D 25 Hydroxy (Vit-D Deficiency, Fractures)      Meds ordered this encounter  Medications   oxybutynin (DITROPAN XL) 10 MG 24 hr tablet    Sig: Take 1 tablet (10 mg total) by mouth at bedtime.    Dispense:  30 tablet    Refill:  3    Follow-up: Return in about 4 months (around 01/30/2023) for Annual physical.    Lindell Spar, MD

## 2022-09-30 NOTE — Assessment & Plan Note (Signed)
S/p lumpectomies °In remission since 2016 °

## 2022-09-30 NOTE — Assessment & Plan Note (Addendum)
BMI Readings from Last 3 Encounters:  09/30/22 26.81 kg/m  07/24/22 27.98 kg/m  04/23/22 31.86 kg/m   Has lost 9 lbs since the last visit On Wegovy - initial BMI - 35.10, increased dose as tolerated - she will not have coverage for [facility] now  Advised to follow low-carb diet and perform more moderate exercise/walking at least 150 minutes/week

## 2022-09-30 NOTE — Patient Instructions (Addendum)
Please take Oxybutynin for overactive bladder.  Please continue taking other medications as prescribed.  Please continue to follow low carb diet and perform moderate exercise/walking at least 150 mins/week.

## 2022-10-02 LAB — UA/M W/RFLX CULTURE, ROUTINE
Bilirubin, UA: NEGATIVE
Glucose, UA: NEGATIVE
Ketones, UA: NEGATIVE
Leukocytes,UA: NEGATIVE
Nitrite, UA: NEGATIVE
Protein,UA: NEGATIVE
Specific Gravity, UA: 1.015 (ref 1.005–1.030)
Urobilinogen, Ur: 1 mg/dL (ref 0.2–1.0)
pH, UA: 6.5 (ref 5.0–7.5)

## 2022-10-02 LAB — MICROSCOPIC EXAMINATION
Bacteria, UA: NONE SEEN
Casts: NONE SEEN /lpf

## 2022-10-08 ENCOUNTER — Ambulatory Visit (HOSPITAL_COMMUNITY)
Admission: RE | Admit: 2022-10-08 | Discharge: 2022-10-08 | Disposition: A | Discharge status: Home / Self Care | Payer: 59 | Source: Ambulatory Visit | Attending: Internal Medicine | Admitting: Internal Medicine

## 2022-10-08 VITALS — BP 122/73 | HR 65 | Temp 97.9°F | Resp 20

## 2022-10-08 DIAGNOSIS — M8580 Other specified disorders of bone density and structure, unspecified site: Secondary | ICD-10-CM | POA: Insufficient documentation

## 2022-10-08 MED ORDER — IBANDRONATE SODIUM 3 MG/3ML IV SOLN
3.0000 mg | Freq: Once | INTRAVENOUS | Status: AC
  Administered 2022-10-08: 3 mg via INTRAVENOUS
  Filled 2022-10-08 (×2): qty 3

## 2022-10-08 NOTE — Progress Notes (Signed)
Diagnosis: Osteoporosis  Provider:  Ihor Dow MD  Procedure: Infusion  IV Type: Peripheral, IV Location: R Antecubital  Boniva (Ibandronate Sodium), Dose: 3 mg  Infusion Start Time: [id number]  Infusion Stop Time: [id number]  Post Infusion IV Care: Patient declined observation and Peripheral IV Discontinued  Discharge: Condition: Good, Destination: Home . AVS Provided  Performed by:  Baxter Hire, RN

## 2022-10-09 ENCOUNTER — Other Ambulatory Visit (HOSPITAL_COMMUNITY): Payer: Self-pay

## 2022-10-09 ENCOUNTER — Other Ambulatory Visit: Payer: Self-pay

## 2022-10-16 ENCOUNTER — Encounter: Payer: Self-pay | Admitting: Obstetrics & Gynecology

## 2022-10-16 ENCOUNTER — Ambulatory Visit: Payer: 59 | Admitting: Obstetrics & Gynecology

## 2022-10-16 ENCOUNTER — Other Ambulatory Visit (HOSPITAL_COMMUNITY): Payer: Self-pay

## 2022-10-16 VITALS — BP 132/79 | HR 72 | Ht 61.2 in | Wt 147.0 lb

## 2022-10-16 DIAGNOSIS — N814 Uterovaginal prolapse, unspecified: Secondary | ICD-10-CM | POA: Diagnosis not present

## 2022-10-16 DIAGNOSIS — N952 Postmenopausal atrophic vaginitis: Secondary | ICD-10-CM | POA: Diagnosis not present

## 2022-10-16 DIAGNOSIS — N8110 Cystocele, unspecified: Secondary | ICD-10-CM

## 2022-10-16 DIAGNOSIS — R3915 Urgency of urination: Secondary | ICD-10-CM

## 2022-10-16 MED ORDER — OSPHENA 60 MG PO TABS: 1.0000 | ORAL_TABLET | Freq: Every day | ORAL | 4 refills | Status: DC

## 2022-10-16 NOTE — Progress Notes (Signed)
GYN VISIT Patient name: Janice Higgins MRN [id number]  Date of birth: [birth year] Chief Complaint:   Referral (Possible uterine proplase)  History of Present Illness:   Janice Higgins is a 67 y.o. G1P1 PM female being seen today for the following concerns:  -Symptoms have gotten progressively worse and most noticeable for the past few months.  +Urgency and urge incontinence.  Feels like something is "falling down"  Dr. Posey Pronto started pt on OAB medication and it doesn't seem to help much.  Taking oxybutynin- seemed to help in the beginning, but not sure if it's really working.   +Splinting, incomplete voiding. Up at night x 2 On occasion sexually active- denies pain  No LMP recorded. Patient is postmenopausal.     10/16/2022    2:39 PM 09/30/2022    9:02 AM 07/24/2022    1:24 PM 04/23/2022    3:49 PM 03/12/2022    3:37 PM  Depression screen PHQ 2/9  Decreased Interest  0 0 0 0  Down, Depressed, Hopeless 0 0 0 0 0  PHQ - 2 Score 0 0 0 0 0  Altered sleeping 0 0     Tired, decreased energy 0 1     Change in appetite 1 0     Feeling bad or failure about yourself  0 0     Trouble concentrating 0 0     Moving slowly or fidgety/restless 0 0     Suicidal thoughts 0 0     PHQ-9 Score 1 1        Review of Systems:   Pertinent items are noted in HPI Denies fever/chills, dizziness, headaches, visual disturbances, fatigue, shortness of breath, chest pain, abdominal pain, vomiting Pertinent History Reviewed:  Reviewed past medical,surgical, social, obstetrical and family history.  Reviewed problem list, medications and allergies. Physical Assessment:   Vitals:   10/16/22 1428  BP: 132/79  Pulse: 72  Weight: 147 lb (66.7 kg)  Height: 5' 1.2" (1.554 m)  Body mass index is 27.59 kg/m.       Physical Examination:   General appearance: alert, well appearing, and in no distress  Psych: mood appropriate, normal affect  Skin: warm & dry   Cardiovascular: normal heart rate  noted  Respiratory: normal respiratory effort, no distress  Abdomen: soft, non-tender, no rebound, no guarding  Pelvic: On initial exam- prolapse noted- passed introitus.  VULVA: normal appearing vulva with no masses, tenderness or lesions, VAGINA:flat, dry mucosa- no discharge or lesions.  CERVIX: normal appearing cervix without discharge or lesions.  Stage 3 uterine prolapse noted with cystocele.  UTERUS: uterus is normal size, shape, consistency and nontender.ADNEXA: normal adnexa in size, nontender and no masses.  Prolapse easily reducible.  Extremities: no edema   Straight Catheterization Procedure for PVR: After verbal consent was obtained from the patient for catheterization to assess bladder emptying and residual volume the urethra and surrounding tissues were prepped with betadine and an in and out catheterization was performed.  PVR was 110mL.  Urine appeared clear yellow. The patient tolerated the procedure well.  Chaperone: Celene Squibb    Pessary fitting completed- Trial of several Ring including ring#4 which fell out.  Ring with support #3 too big and did not fit under pubic symphysis.  Transitioned Gelhorn #3.  Assessment & Plan:  1) POP- Stage 3, urinary urgency -reviewed findings on exam -PVR completed, UA and culture obtained -discussed conservative vs surgical intervention -desires conservative treatment- pessary fitting completed and Gelhorn fitted  and given during today's appointment -for now continue oxybutynin though she may stop to see if she notes any change -pt to try to remove/replace prior to next appointment- as hopeful she may learn to clean/replace on her own especially since sexually active  2) Vaginal atrophy -will also plan to treat due to pessary -discussed treatment options- prefers pill -Pt denies history of any of the following:  Untreated hypertension, active liver disease with abnormal liver function tests, active or recent arterial thromboembolic  disease, or previous or current venous thromboembolism   Meds ordered this encounter  Medications   Ospemifene (OSPHENA) 60 MG TABS    Sig: Take 1 tablet (60 mg total) by mouth daily.    Dispense:  90 tablet    Refill:  4     Orders Placed This Encounter  Procedures   Urine Culture   Urinalysis, Routine w reflex microscopic    Return in about 4 weeks (around 11/13/2022) for with Dr. Nelda Marseille- pessar follow up.   Janyth Pupa, DO Attending [place], [facility] for [employer], [place]

## 2022-10-17 LAB — MICROSCOPIC EXAMINATION
Bacteria, UA: NONE SEEN
Casts: NONE SEEN /lpf
Epithelial Cells (non renal): NONE SEEN /hpf (ref 0–10)
WBC, UA: NONE SEEN /hpf (ref 0–5)

## 2022-10-17 LAB — URINALYSIS, ROUTINE W REFLEX MICROSCOPIC
Bilirubin, UA: NEGATIVE
Glucose, UA: NEGATIVE
Ketones, UA: NEGATIVE
Leukocytes,UA: NEGATIVE
Nitrite, UA: NEGATIVE
Protein,UA: NEGATIVE
Specific Gravity, UA: 1.018 (ref 1.005–1.030)
Urobilinogen, Ur: 1 mg/dL (ref 0.2–1.0)
pH, UA: 6 (ref 5.0–7.5)

## 2022-10-18 LAB — URINE CULTURE: Organism ID, Bacteria: NO GROWTH

## 2022-10-21 ENCOUNTER — Other Ambulatory Visit (HOSPITAL_COMMUNITY): Payer: Self-pay

## 2022-10-22 ENCOUNTER — Other Ambulatory Visit (HOSPITAL_COMMUNITY): Payer: Self-pay

## 2022-10-22 MED ORDER — OSPHENA 60 MG PO TABS
60.0000 mg | ORAL_TABLET | Freq: Every day | ORAL | 4 refills | Status: DC
  Filled 2022-10-22 – 2022-10-30 (×2): qty 90, 90d supply, fill #0

## 2022-10-24 ENCOUNTER — Encounter (HOSPITAL_COMMUNITY): Payer: Self-pay | Admitting: Internal Medicine

## 2022-10-24 ENCOUNTER — Other Ambulatory Visit (HOSPITAL_COMMUNITY): Payer: Self-pay

## 2022-10-30 ENCOUNTER — Other Ambulatory Visit: Payer: Self-pay

## 2022-10-31 ENCOUNTER — Other Ambulatory Visit: Payer: Self-pay

## 2022-11-04 ENCOUNTER — Other Ambulatory Visit: Payer: Self-pay

## 2022-11-04 ENCOUNTER — Other Ambulatory Visit (HOSPITAL_COMMUNITY): Payer: Self-pay

## 2022-11-05 ENCOUNTER — Other Ambulatory Visit (HOSPITAL_COMMUNITY): Payer: Self-pay

## 2022-11-13 ENCOUNTER — Ambulatory Visit: Payer: 59 | Admitting: Obstetrics & Gynecology

## 2022-11-13 ENCOUNTER — Other Ambulatory Visit (HOSPITAL_COMMUNITY): Payer: Self-pay

## 2022-11-13 ENCOUNTER — Encounter: Payer: Self-pay | Admitting: Obstetrics & Gynecology

## 2022-11-13 VITALS — BP 109/61 | HR 77 | Ht 61.0 in | Wt 141.8 lb

## 2022-11-13 DIAGNOSIS — Z4689 Encounter for fitting and adjustment of other specified devices: Secondary | ICD-10-CM | POA: Diagnosis not present

## 2022-11-13 DIAGNOSIS — N952 Postmenopausal atrophic vaginitis: Secondary | ICD-10-CM

## 2022-11-13 DIAGNOSIS — R3915 Urgency of urination: Secondary | ICD-10-CM

## 2022-11-13 DIAGNOSIS — N814 Uterovaginal prolapse, unspecified: Secondary | ICD-10-CM

## 2022-11-13 DIAGNOSIS — Z853 Personal history of malignant neoplasm of breast: Secondary | ICD-10-CM | POA: Diagnosis not present

## 2022-11-13 DIAGNOSIS — N3281 Overactive bladder: Secondary | ICD-10-CM | POA: Diagnosis not present

## 2022-11-13 MED ORDER — ESTRADIOL 0.1 MG/GM VA CREA: TOPICAL_CREAM | VAGINAL | 6 refills | Status: DC

## 2022-11-13 MED ORDER — TOLTERODINE TARTRATE ER 2 MG PO CP24
2.0000 mg | ORAL_CAPSULE | Freq: Every day | ORAL | 11 refills | Status: AC
  Filled 2022-12-10: qty 30, 30d supply, fill #0
  Filled 2023-01-06: qty 30, 30d supply, fill #1
  Filled 2023-02-04: qty 30, 30d supply, fill #2
  Filled 2023-03-12: qty 30, 30d supply, fill #3
  Filled 2023-04-11: qty 30, 30d supply, fill #4
  Filled 2023-05-05: qty 30, 30d supply, fill #5
  Filled 2023-06-05: qty 30, 30d supply, fill #6
  Filled 2023-07-10: qty 30, 30d supply, fill #7
  Filled 2023-08-11: qty 30, 30d supply, fill #8
  Filled 2023-09-08: qty 30, 30d supply, fill #9
  Filled 2023-10-06: qty 30, 30d supply, fill #10

## 2022-11-13 NOTE — Progress Notes (Signed)
GYN VISIT Patient name: Janice Higgins MRN [id number]  Date of birth: 08/20/1955 Chief Complaint:    Chief Complaint  Patient presents with   Pessary Check   History of Present Illness:   Janice Higgins is a 67 y.o. G1P1 PM female being seen today for pessary maintenance.  POP/pessary maintenance: At her last visit 10/16/2022 she was fitted with a Gellhorn 3 pessary.  Today she is just delighted as she notes considerable improvement.  She notes a slight odor but denies discharge itching or bleeding.  OAB: Stopped oxybutynin due to pessary.  However noted worsening nocturia.  Patient restarted medication.  Concerned about possible reaction as she notes rash on her legs  Likert scale(1 not bothersome -5 very bothersome)  : 1  Review of Systems:   Pertinent items are noted in HPI Denies fever/chills, dizziness, headaches, visual disturbances, fatigue, shortness of breath, chest pain, abdominal pain, vomiting. Pertinent History Reviewed:  Reviewed past medical,surgical, social, obstetrical and family history.  Reviewed problem list, medications and allergies. Physical Assessment:   Vitals:   11/13/22 1435  BP: 109/61  Pulse: 77  Weight: 141 lb 12.8 oz (64.3 kg)  Height: 5\' 1"  (1.549 m)  Body mass index is 26.79 kg/m.       Physical Examination:   General appearance: alert, well appearing, and in no distress  Psych: mood appropriate, normal affect  Skin: warm & dry   Cardiovascular: normal heart rate noted  Respiratory: normal respiratory effort, no distress  Abdomen: soft, non-tender   GU: normal external genitalia.    Ext: no discrete rash, purple-spider like areas noted bilateral lower extremities The pessary is removed. Vagina: Exam reveals no undue vaginal mucosal pressure of breakdown, no discharge and no vaginal bleeding.  Vaginal Epithelial Abnormality Classification System:   0 0    No abnormalities 1    Epithelial erythema 2    Granulation tissue 3    Epithelial  break or erosion, 1 cm or less 4    Epithelial break or erosion, 1 cm or greater   Irrigation completed- no vaginal bleeding or abnormalities noted  Extremities: no edema   Chaperone: [name]    Assessment & Plan:     ICD-10-CM   1. Pessary maintenance  Z46.89     2. OAB (overactive bladder)  N32.81 tolterodine (DETROL LA) 2 MG 24 hr capsule    3. Uterine prolapse  N81.4     4. Vaginal atrophy  N95.2 estradiol (ESTRACE VAGINAL) 0.1 MG/GM vaginal cream    5. Urinary urgency  R39.15     6. History of breast cancer  Z85.3        -Doing well with pessary, will start every 3 month appointments -in terms of "reaction" findings on lower extremities seem more vascular in nature.  Regardless we will trial transition from oxybutynin to Detrol, follow-up in 3 months -Patient unable to obtain Osphena, due to remote history of breast cancer she is concerned about estrogen therapy.  Okay to trial Replens or coconut oil -Discussed that low-dose estrogen cream may be considered in current setting  Meds ordered this encounter  Medications   tolterodine (DETROL LA) 2 MG 24 hr capsule    Sig: Take 1 capsule (2 mg total) by mouth daily.    Dispense:  30 capsule    Refill:  11   estradiol (ESTRACE VAGINAL) 0.1 MG/GM vaginal cream    Sig: Pea-sized amount once per week    Dispense:  42.5 g    Refill:  6     Return in about 3 months (around 02/13/2023) for pessary follow up (Cherine Drumgoole).   [name], DO Attending Obstetrician & Gynecologist, [facility] for [employer], [facility]

## 2022-11-16 ENCOUNTER — Other Ambulatory Visit (HOSPITAL_COMMUNITY): Payer: Self-pay

## 2022-11-25 ENCOUNTER — Ambulatory Visit: Payer: 59 | Admitting: Internal Medicine

## 2022-11-26 ENCOUNTER — Other Ambulatory Visit (HOSPITAL_COMMUNITY): Payer: Self-pay

## 2022-11-26 ENCOUNTER — Other Ambulatory Visit: Payer: Self-pay

## 2022-12-10 ENCOUNTER — Other Ambulatory Visit: Payer: Self-pay

## 2022-12-10 ENCOUNTER — Other Ambulatory Visit (HOSPITAL_COMMUNITY): Payer: Self-pay

## 2022-12-11 ENCOUNTER — Other Ambulatory Visit (HOSPITAL_COMMUNITY): Payer: Self-pay

## 2022-12-12 ENCOUNTER — Other Ambulatory Visit (HOSPITAL_COMMUNITY): Payer: Self-pay

## 2022-12-23 ENCOUNTER — Other Ambulatory Visit (HOSPITAL_COMMUNITY): Payer: Self-pay

## 2023-01-06 ENCOUNTER — Other Ambulatory Visit (HOSPITAL_COMMUNITY): Payer: Self-pay

## 2023-01-06 ENCOUNTER — Other Ambulatory Visit: Payer: Self-pay

## 2023-01-08 ENCOUNTER — Encounter (HOSPITAL_COMMUNITY)
Admission: RE | Admit: 2023-01-08 | Discharge: 2023-01-08 | Disposition: A | Discharge status: Home / Self Care | Payer: 59 | Source: Ambulatory Visit | Attending: Internal Medicine | Admitting: Internal Medicine

## 2023-01-08 VITALS — BP 100/63 | HR 75 | Temp 98.3°F | Resp 16

## 2023-01-08 DIAGNOSIS — M8580 Other specified disorders of bone density and structure, unspecified site: Secondary | ICD-10-CM

## 2023-01-08 DIAGNOSIS — M810 Age-related osteoporosis without current pathological fracture: Secondary | ICD-10-CM | POA: Insufficient documentation

## 2023-01-08 MED ORDER — IBANDRONATE SODIUM 3 MG/3ML IV SOLN
3.0000 mg | Freq: Once | INTRAVENOUS | Status: AC
  Administered 2023-01-08: 3 mg via INTRAVENOUS

## 2023-01-08 NOTE — Addendum Note (Signed)
Encounter addended by: Arrie Senate, RN on: 01/08/2023 2:13 PM  Actions taken: Therapy plan modified

## 2023-01-08 NOTE — Progress Notes (Signed)
Diagnosis: Osteoporosis  Provider:  Trena Platt MD  Procedure: Infusion  IV Type: Peripheral, IV Location: L Antecubital  Boniva (Ibandronate Sodium), Dose: 3 mg  Infusion Start Time: 1359  Infusion Stop Time: 1400  Post Infusion IV Care: Patient declined observation and Peripheral IV Discontinued  Discharge: Condition: Good, Destination: Home . AVS Provided  Performed by:  Wyvonne Lenz, RN

## 2023-01-29 ENCOUNTER — Other Ambulatory Visit: Payer: Self-pay

## 2023-02-04 ENCOUNTER — Other Ambulatory Visit (HOSPITAL_COMMUNITY): Payer: Self-pay

## 2023-02-04 ENCOUNTER — Other Ambulatory Visit: Payer: Self-pay | Admitting: Internal Medicine

## 2023-02-04 DIAGNOSIS — Z0000 Encounter for general adult medical examination without abnormal findings: Secondary | ICD-10-CM

## 2023-02-04 MED ORDER — FAMOTIDINE 40 MG PO TABS
40.0000 mg | ORAL_TABLET | Freq: Every day | ORAL | 5 refills | Status: DC
Start: 2023-02-04 — End: 2023-08-04
  Filled 2023-02-04: qty 30, 30d supply, fill #0
  Filled 2023-03-12: qty 30, 30d supply, fill #1
  Filled 2023-04-11: qty 30, 30d supply, fill #2
  Filled 2023-05-06: qty 30, 30d supply, fill #3
  Filled 2023-06-05: qty 30, 30d supply, fill #4
  Filled 2023-07-10: qty 30, 30d supply, fill #5

## 2023-02-05 ENCOUNTER — Other Ambulatory Visit: Payer: Self-pay

## 2023-02-05 ENCOUNTER — Other Ambulatory Visit (HOSPITAL_COMMUNITY): Payer: Self-pay

## 2023-02-05 ENCOUNTER — Encounter: Payer: Self-pay | Admitting: Internal Medicine

## 2023-02-05 ENCOUNTER — Ambulatory Visit (INDEPENDENT_AMBULATORY_CARE_PROVIDER_SITE_OTHER): Payer: 59 | Admitting: Internal Medicine

## 2023-02-05 VITALS — BP 127/75 | HR 86 | Ht 61.0 in | Wt 148.4 lb

## 2023-02-05 DIAGNOSIS — R7303 Prediabetes: Secondary | ICD-10-CM

## 2023-02-05 DIAGNOSIS — I10 Essential (primary) hypertension: Secondary | ICD-10-CM

## 2023-02-05 DIAGNOSIS — Z23 Encounter for immunization: Secondary | ICD-10-CM | POA: Diagnosis not present

## 2023-02-05 DIAGNOSIS — M8580 Other specified disorders of bone density and structure, unspecified site: Secondary | ICD-10-CM | POA: Diagnosis not present

## 2023-02-05 DIAGNOSIS — E559 Vitamin D deficiency, unspecified: Secondary | ICD-10-CM

## 2023-02-05 DIAGNOSIS — Z0001 Encounter for general adult medical examination with abnormal findings: Secondary | ICD-10-CM

## 2023-02-05 DIAGNOSIS — E669 Obesity, unspecified: Secondary | ICD-10-CM | POA: Diagnosis not present

## 2023-02-05 DIAGNOSIS — L239 Allergic contact dermatitis, unspecified cause: Secondary | ICD-10-CM

## 2023-02-05 DIAGNOSIS — S22060D Wedge compression fracture of T7-T8 vertebra, subsequent encounter for fracture with routine healing: Secondary | ICD-10-CM | POA: Diagnosis not present

## 2023-02-05 MED ORDER — TRIAMCINOLONE ACETONIDE 0.1 % EX CREA
1.0000 | TOPICAL_CREAM | Freq: Two times a day (BID) | CUTANEOUS | 1 refills | Status: DC
Start: 2023-02-05 — End: 2023-08-07
  Filled 2023-02-05: qty 30, 15d supply, fill #0
  Filled 2023-02-19: qty 30, 30d supply, fill #0
  Filled 2023-04-11: qty 30, 30d supply, fill #1

## 2023-02-05 MED ORDER — AMLODIPINE BESYLATE 5 MG PO TABS
5.0000 mg | ORAL_TABLET | Freq: Every day | ORAL | 3 refills | Status: DC
Start: 2023-02-05 — End: 2024-02-16
  Filled 2023-02-05 – 2023-03-12 (×2): qty 90, 90d supply, fill #0
  Filled 2023-06-30: qty 90, 90d supply, fill #1
  Filled 2023-09-23: qty 90, 90d supply, fill #2
  Filled 2024-01-11: qty 90, 90d supply, fill #3
  Filled 2024-01-12: qty 90, 90d supply, fill #0

## 2023-02-05 MED ORDER — LISINOPRIL 5 MG PO TABS
5.0000 mg | ORAL_TABLET | Freq: Every day | ORAL | 3 refills | Status: DC
Start: 2023-02-05 — End: 2024-02-16
  Filled 2023-02-05 – 2023-02-20 (×2): qty 90, 90d supply, fill #0
  Filled 2023-05-22: qty 90, 90d supply, fill #1
  Filled 2023-09-08: qty 90, 90d supply, fill #2
  Filled 2023-12-05: qty 90, 90d supply, fill #3

## 2023-02-05 MED ORDER — HYDROCODONE-ACETAMINOPHEN 5-325 MG PO TABS
1.0000 | ORAL_TABLET | Freq: Every evening | ORAL | 0 refills | Status: DC | PRN
Start: 2023-02-05 — End: 2024-02-16
  Filled 2023-02-05 – 2023-02-19 (×2): qty 45, 45d supply, fill #0

## 2023-02-05 NOTE — Assessment & Plan Note (Signed)
Lab Results  Component Value Date   HGBA1C 5.5 07/24/2022   Advised to follow low-carb diet for now

## 2023-02-05 NOTE — Assessment & Plan Note (Signed)

## 2023-02-05 NOTE — Patient Instructions (Addendum)
Please apply Kenalog cream over leg rash area.  Please continue to take medications as prescribed.  Please continue to follow DASH diet and perform moderate exercise/walking at least 150 mins/week.

## 2023-02-05 NOTE — Progress Notes (Signed)
Established Patient Office Visit  Subjective:  Patient ID: Janice Higgins, female    DOB: 23-Aug-1955  Age: 67 y.o. MRN: [id number]  CC:  Chief Complaint  Patient presents with   Annual Exam    HPI Janice Higgins is a 67 y.o. female with past medical history of HTN, GERD, osteopenia, breast ca. s/p lumpectomy, OA and obesity who presents for annual physical.  HTN: BP is well-controlled. Takes medications regularly. Patient denies headache, dizziness, chest pain, dyspnea or palpitations.  Her DEXA scan showed osteopenia.  She has tried oral bisphosphonate, but had severe esophagitis with it.  She has been tolerating Boniva well.  She takes Pepcid for GERD now.  She complains of worsening of acid reflux since discontinuing Protonix.  Denies any nausea, vomiting, melena or hematochezia.  She is concerned about her weight.  She had lost more than 40 pounds with Wegovy, but has gained about 11 pounds since stopping the [facility] in 04/24.  She states that she has been trying to control her urges for eating, but is difficult at times.  She has had improvement in urinary urgency with pessary placement.  She was given trial of oxybutynin, but had a leg rash around the same time and was told of allergy to oxybutynin, although she does not agree to it.  She still has erythematous rash on legs despite stopping oxybutynin.  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   Family history of breast cancer    History of breast cancer 04/25/2015   History of bronchitis    History of kidney stones    Hypertension    Osteoarthritis    hands and feet   Osteoporosis    PMB (postmenopausal bleeding) 04/25/2015   Radiation 10/13/14-11/04/14   Left breast    Past Surgical History:  Procedure Laterality Date   BREAST LUMPECTOMY WITH RADIOACTIVE SEED LOCALIZATION Left 08/12/2014   Procedure:  RADIOACTIVE SEED LOCALIZATION LEFT  BREAST LUMPECTOMY;  Surgeon: Glenna Fellows, MD;  Location: [facility];  Service: General;  Laterality: Left;   COLONOSCOPY N/A 05/04/2015   Procedure: COLONOSCOPY;  Surgeon: Malissa Hippo, MD;  [facility];  Service: Endoscopy;  Laterality: N/A;  7:30   FOOT SURGERY Right 1995   exc. bone spur   LASIK     RE-EXCISION OF BREAST LUMPECTOMY Left 08/22/2014   Procedure: RE-EXCISION OF LEFT BREAST LUMPECTOMY;  Surgeon: Glenna Fellows, MD;  Location: [facility] OR;  Service: General;  Laterality: Left;   RE-EXCISION OF BREAST LUMPECTOMY Left 09/05/2014   Procedure: RE-EXCISION OF BREAST LUMPECTOMY;  Surgeon: Glenna Fellows, MD;  Location: [facility];  Service: General;  Laterality: Left;   TONSILLECTOMY     TUBAL LIGATION      Family History  Problem Relation Age of Onset   Hypertension Father    Hypertension Mother    Heart disease Mother 68   Breast cancer Sister 63   Other Brother        lived 3 months   Heart attack Maternal Grandfather    Emphysema Paternal Grandfather    COPD Paternal Grandfather     Social History   Socioeconomic History   Marital status: Married    Spouse name: Not on file   Number of children: Not on file   Years of education: Not on file   Highest education level: Not  on file  Occupational History   Not on file  Tobacco Use   Smoking status: Never   Smokeless tobacco: Never  Vaping Use   Vaping status: Never Used  Substance and Sexual Activity   Alcohol use: No   Drug use: No   Sexual activity: Yes    Birth control/protection: Post-menopausal  Other Topics Concern   Not on file  Social History Narrative   Not on file   Social Determinants of Health   Financial Resource Strain: Low Risk  (10/16/2022)   Overall Financial Resource Strain (CARDIA)    Difficulty of Paying Living Expenses: Not very hard  Food Insecurity: No Food Insecurity (10/16/2022)   Hunger Vital Sign    Worried About Running Out of  Food in the Last Year: Never true    Ran Out of Food in the Last Year: Never true  Transportation Needs: No Transportation Needs (10/16/2022)   PRAPARE - Administrator, Civil Service (Medical): No    Lack of Transportation (Non-Medical): No  Physical Activity: Insufficiently Active (10/16/2022)   Exercise Vital Sign    Days of Exercise per Week: 2 days    Minutes of Exercise per Session: 10 min  Stress: No Stress Concern Present (10/16/2022)   [employer] of Occupational Health - Occupational Stress Questionnaire    Feeling of Stress : Only a little  Social Connections: Socially Integrated (10/16/2022)   Social Connection and Isolation Panel [NHANES]    Frequency of Communication with Friends and Family: More than three times a week    Frequency of Social Gatherings with Friends and Family: Twice a week    Attends Religious Services: More than 4 times per year    Active Member of [employer] or Organizations: Yes    Attends Engineer, structural: More than 4 times per year    Marital Status: Married  Catering manager Violence: Not At Risk (10/16/2022)   Humiliation, Afraid, Rape, and Kick questionnaire    Fear of Current or Ex-Partner: No    Emotionally Abused: No    Physically Abused: No    Sexually Abused: No    Outpatient Medications Prior to Visit  Medication Sig Dispense Refill   albuterol (VENTOLIN HFA) 108 (90 Base) MCG/ACT inhaler Inhale 2 puffs into the lungs every 4 (four) hours as needed for wheezing or shortness of breath. 1 each 3   diclofenac (VOLTAREN) 75 MG EC tablet Take 1 tablet (75 mg total) by mouth 2 (two) times daily. 90 tablet 1   estradiol (ESTRACE VAGINAL) 0.1 MG/GM vaginal cream Pea-sized amount once per week 42.5 g 6   famotidine (PEPCID) 40 MG tablet Take 1 tablet (40 mg total) by mouth daily. 30 tablet 5   ibandronate (BONIVA) 3 MG/3ML SOLN injection Inject 3 mLs (3 mg total) into the vein every 3 (three) months. 3 mL 3   methocarbamol  (ROBAXIN) 750 MG tablet Take 1 tablet (750 mg total) by mouth at bedtime. 30 tablet 2   tolterodine (DETROL LA) 2 MG 24 hr capsule Take 1 capsule (2 mg total) by mouth daily. 30 capsule 11   amLODipine (NORVASC) 5 MG tablet Take 1 tablet (5 mg total) by mouth daily. 90 tablet 3   HYDROcodone-acetaminophen (NORCO/VICODIN) 5-325 MG tablet Take 1 tablet by mouth at bedtime as needed for pain 45 tablet 0   lisinopril (ZESTRIL) 5 MG tablet Take 1 tablet (5 mg total) by mouth daily. 90 tablet 3   Semaglutide-Weight Management (WEGOVY)  2.4 MG/0.75ML SOAJ Inject 2.4 mg into the skin once a week. (Patient not taking: Reported on 02/05/2023) 3 mL 5   No facility-administered medications prior to visit.    Allergies  Allergen Reactions   Fosamax [Alendronate Sodium] Other (See Comments)    Severe GI Upset   Sulfa Antibiotics Hives    ROS Review of Systems  Constitutional:  Negative for chills and fever.  HENT:  Negative for congestion, sinus pressure, sinus pain and sore throat.   Eyes:  Negative for pain and discharge.  Respiratory:  Negative for cough and shortness of breath.   Cardiovascular:  Negative for chest pain and palpitations.  Gastrointestinal:  Negative for abdominal pain, diarrhea, nausea and vomiting.  Endocrine: Negative for polydipsia and polyuria.  Genitourinary:  Positive for urgency. Negative for dysuria and hematuria.  Musculoskeletal:  Positive for arthralgias and back pain. Negative for neck pain and neck stiffness.  Skin:  Negative for rash.  Neurological:  Negative for dizziness and weakness.  Psychiatric/Behavioral:  Negative for agitation and behavioral problems.       Objective:    Physical Exam Vitals reviewed.  Constitutional:      General: She is not in acute distress.    Appearance: She is obese. She is not diaphoretic.  HENT:     Head: Normocephalic and atraumatic.     Nose: Nose normal.     Mouth/Throat:     Mouth: Mucous membranes are moist.  Eyes:      General: No scleral icterus.    Extraocular Movements: Extraocular movements intact.  Cardiovascular:     Rate and Rhythm: Normal rate and regular rhythm.     Pulses: Normal pulses.     Heart sounds: Normal heart sounds. No murmur heard. Pulmonary:     Breath sounds: Normal breath sounds. No wheezing or rales.  Abdominal:     Palpations: Abdomen is soft.     Tenderness: There is no abdominal tenderness.  Musculoskeletal:     Cervical back: Neck supple. No tenderness.     Right lower leg: No edema.     Left lower leg: No edema.  Skin:    General: Skin is warm.     Findings: No rash.  Neurological:     General: No focal deficit present.     Mental Status: She is alert and oriented to person, place, and time.     Cranial Nerves: No cranial nerve deficit.     Sensory: No sensory deficit.     Motor: No weakness.  Psychiatric:        Mood and Affect: Mood normal.        Behavior: Behavior normal.     BP 127/75 (BP Location: Right Arm, Patient Position: Sitting, Cuff Size: Normal)   Pulse 86   Ht 5\' 1"  (1.549 m)   Wt 148 lb 6.4 oz (67.3 kg)   SpO2 96%   BMI 28.04 kg/m  Wt Readings from Last 3 Encounters:  02/05/23 148 lb 6.4 oz (67.3 kg)  11/13/22 141 lb 12.8 oz (64.3 kg)  10/16/22 147 lb (66.7 kg)    Lab Results  Component Value Date   TSH 3.680 01/16/2022   Lab Results  Component Value Date   WBC 7.1 01/16/2022   HGB 14.4 01/16/2022   HCT 43.8 01/16/2022   MCV 91 01/16/2022   PLT 233 01/16/2022   Lab Results  Component Value Date   NA 144 07/24/2022   K 4.4 07/24/2022   CO2  23 07/24/2022   GLUCOSE 87 07/24/2022   BUN 13 07/24/2022   CREATININE 0.60 07/24/2022   BILITOT 0.3 07/24/2022   ALKPHOS 43 (L) 07/24/2022   AST 14 07/24/2022   ALT 16 07/24/2022   PROT 6.7 07/24/2022   ALBUMIN 4.6 07/24/2022   CALCIUM 9.5 07/24/2022   ANIONGAP 5 04/03/2021   EGFR 100 07/24/2022   Lab Results  Component Value Date   CHOL 149 07/24/2022   Lab Results   Component Value Date   HDL 56 07/24/2022   Lab Results  Component Value Date   LDLCALC 76 07/24/2022   Lab Results  Component Value Date   TRIG 94 07/24/2022   Lab Results  Component Value Date   CHOLHDL 2.7 07/24/2022   Lab Results  Component Value Date   HGBA1C 5.5 07/24/2022      Assessment & Plan:   Problem List Items Addressed This Visit    Problem List Items Addressed This Visit       Cardiovascular and Mediastinum   Essential hypertension    BP Readings from Last 1 Encounters:  02/05/23 127/75   Well-controlled with lisinopril and amlodipine Counseled for compliance with the medications Advised DASH diet and moderate exercise/walking, at least 150 mins/week      Relevant Medications   amLODipine (NORVASC) 5 MG tablet   lisinopril (ZESTRIL) 5 MG tablet   Other Relevant Orders   TSH   CMP14+EGFR   CBC with Differential/Platelet     Musculoskeletal and Integument   Compression fracture of thoracic vertebra with routine healing   Relevant Medications   HYDROcodone-acetaminophen (NORCO/VICODIN) 5-325 MG tablet   Osteopenia    With h/o compression fracture of thoracic vertebra Did not tolerate oral bisphosphonate On Boniva currently - since 11/20      Relevant Orders   DG Bone Density   Allergic contact dermatitis   Relevant Medications   triamcinolone cream (KENALOG) 0.1 %     Other   Encounter for general adult medical examination with abnormal findings - Primary    Physical exam as documented. Counseling done  re healthy lifestyle involving commitment to 150 minutes exercise per week, heart healthy diet, and attaining healthy weight.The importance of adequate sleep also discussed. Changes in health habits are decided on by the patient with goals and time frames  set for achieving them. Immunization and cancer screening needs are specifically addressed at this visit.      Obesity    BMI Readings from Last 3 Encounters:  02/05/23 28.04 kg/m   11/13/22 26.79 kg/m  10/16/22 27.59 kg/m   Has lost > 40 lbs with NUUVOZ, had to stop due to insurance coverage concern Advised to follow low-carb diet and perform more moderate exercise/walking at least 150 minutes/week      Prediabetes    Lab Results  Component Value Date   HGBA1C 5.5 07/24/2022   Advised to follow low-carb diet for now      Relevant Orders   Hemoglobin A1c   Other Visit Diagnoses     Vitamin D deficiency       Relevant Orders   VITAMIN D 25 Hydroxy (Vit-D Deficiency, Fractures)   Need for Tdap vaccination       Relevant Orders   Tdap vaccine greater than or equal to 7yo IM (Completed)        Meds ordered this encounter  Medications   amLODipine (NORVASC) 5 MG tablet    Sig: Take 1 tablet (5 mg total) by  mouth daily.    Dispense:  90 tablet    Refill:  3   lisinopril (ZESTRIL) 5 MG tablet    Sig: Take 1 tablet (5 mg total) by mouth daily.    Dispense:  90 tablet    Refill:  3   triamcinolone cream (KENALOG) 0.1 %    Sig: Apply 1 Application topically 2 (two) times daily.    Dispense:  30 g    Refill:  1   HYDROcodone-acetaminophen (NORCO/VICODIN) 5-325 MG tablet    Sig: Take 1 tablet by mouth at bedtime as needed for pain    Dispense:  45 tablet    Refill:  0    Follow-up: Return in about 6 months (around 08/08/2023) for HTN and osteopenia.    Anabel Halon, MD

## 2023-02-05 NOTE — Assessment & Plan Note (Addendum)
With h/o compression fracture of thoracic vertebra Did not tolerate oral bisphosphonate On Boniva currently - since 11/20

## 2023-02-05 NOTE — Assessment & Plan Note (Signed)
BP Readings from Last 1 Encounters:  02/05/23 127/75   Well-controlled with lisinopril and amlodipine Counseled for compliance with the medications Advised DASH diet and moderate exercise/walking, at least 150 mins/week

## 2023-02-05 NOTE — Assessment & Plan Note (Signed)
BMI Readings from Last 3 Encounters:  02/05/23 28.04 kg/m  11/13/22 26.79 kg/m  10/16/22 27.59 kg/m   Has lost > 40 lbs with NWGNFA, had to stop due to insurance coverage concern Advised to follow low-carb diet and perform more moderate exercise/walking at least 150 minutes/week

## 2023-02-06 ENCOUNTER — Other Ambulatory Visit (HOSPITAL_COMMUNITY): Payer: Self-pay | Admitting: Internal Medicine

## 2023-02-06 DIAGNOSIS — Z1231 Encounter for screening mammogram for malignant neoplasm of breast: Secondary | ICD-10-CM

## 2023-02-06 LAB — CBC WITH DIFFERENTIAL/PLATELET
Basophils Absolute: 0.1 10*3/uL (ref 0.0–0.2)
Basos: 1 %
Eos: 5 %
Hematocrit: 37.3 % (ref 34.0–46.6)
Immature Grans (Abs): 0 10*3/uL (ref 0.0–0.1)
Immature Granulocytes: 0 %
Lymphs: 35 %
MCHC: 34.6 g/dL (ref 31.5–35.7)
MCV: 94 fL (ref 79–97)
Monocytes Absolute: 0.5 10*3/uL (ref 0.1–0.9)
Monocytes: 7 %
Neutrophils Absolute: 3.9 10*3/uL (ref 1.4–7.0)
Neutrophils: 52 %
Platelets: 353 10*3/uL (ref 150–450)
RBC: 3.96 x10E6/uL (ref 3.77–5.28)
RDW: 12.4 % (ref 11.7–15.4)
WBC: 7.5 10*3/uL (ref 3.4–10.8)

## 2023-02-06 LAB — CMP14+EGFR
ALT: 26 IU/L (ref 0–32)
AST: 22 IU/L (ref 0–40)
Albumin: 4.3 g/dL (ref 3.9–4.9)
BUN/Creatinine Ratio: 26 (ref 12–28)
BUN: 18 mg/dL (ref 8–27)
Bilirubin Total: <0.2 mg/dL (ref 0.0–1.2)
CO2: 23 mmol/L (ref 20–29)
Calcium: 9.7 mg/dL (ref 8.7–10.3)
Chloride: 108 mmol/L — ABNORMAL HIGH (ref 96–106)
Creatinine, Ser: 0.69 mg/dL (ref 0.57–1.00)
Globulin, Total: 2.1 g/dL (ref 1.5–4.5)
Glucose: 99 mg/dL (ref 70–99)
Potassium: 4.1 mmol/L (ref 3.5–5.2)
Sodium: 146 mmol/L — ABNORMAL HIGH (ref 134–144)
Total Protein: 6.4 g/dL (ref 6.0–8.5)
eGFR: 96 mL/min/{1.73_m2} (ref >59)

## 2023-02-06 LAB — TSH: TSH: 2.15 u[IU]/mL (ref 0.450–4.500)

## 2023-02-06 LAB — HEMOGLOBIN A1C
Est. average glucose Bld gHb Est-mCnc: 114 mg/dL
Hgb A1c MFr Bld: 5.6 % (ref 4.8–5.6)

## 2023-02-06 LAB — VITAMIN D 25 HYDROXY (VIT D DEFICIENCY, FRACTURES): Vit D, 25-Hydroxy: 52.4 ng/mL (ref 30.0–100.0)

## 2023-02-12 ENCOUNTER — Encounter: Payer: Self-pay | Admitting: Obstetrics & Gynecology

## 2023-02-12 ENCOUNTER — Ambulatory Visit: Payer: 59 | Admitting: Obstetrics & Gynecology

## 2023-02-12 VITALS — BP 121/68 | HR 74 | Ht 61.0 in | Wt 148.6 lb

## 2023-02-12 DIAGNOSIS — N952 Postmenopausal atrophic vaginitis: Secondary | ICD-10-CM

## 2023-02-12 DIAGNOSIS — Z4689 Encounter for fitting and adjustment of other specified devices: Secondary | ICD-10-CM

## 2023-02-12 DIAGNOSIS — N814 Uterovaginal prolapse, unspecified: Secondary | ICD-10-CM

## 2023-02-12 DIAGNOSIS — N3281 Overactive bladder: Secondary | ICD-10-CM | POA: Diagnosis not present

## 2023-02-12 NOTE — Progress Notes (Signed)
     GYN VISIT Patient name: Janice Higgins MRN [id number]  Date of birth: [birth year] Chief Complaint:    Chief Complaint  Patient presents with   Pessary Check   History of Present Illness:   Janice Higgins is a 67 y.o. G1P1 PM female being seen today for pessary maintenance.     She uses a Electronics engineer #3 She reports no vaginal discharge, just a vaginal odor and no vaginal bleeding   OAB- Last visit started on Detrol feels like it is about the same as the oxybutynin.  She has not complained of any leg rash or other complications.  Likert scale(1 not bothersome -5 very bothersome)  :  1  Review of Systems:   Pertinent items are noted in HPI Denies fever/chills, dizziness, headaches, visual disturbances, fatigue, shortness of breath, chest pain, abdominal pain, vomiting. Pertinent History Reviewed:  Reviewed past medical,surgical, social, obstetrical and family history.  Reviewed problem list, medications and allergies. Physical Assessment:   Vitals:   02/12/23 1615  BP: 121/68  Pulse: 74  Weight: 148 lb 9.6 oz (67.4 kg)  Height: 5\' 1"  (1.549 m)  Body mass index is 28.08 kg/m.       Physical Examination:   General appearance: alert, well appearing, and in no distress  Psych: mood appropriate, normal affect  Skin: warm & dry   Cardiovascular: normal heart rate noted  Respiratory: normal respiratory effort, no distress  Abdomen: soft, non-tender   GU: normal external genitalia.   The pessary is removed using ring forceps.   Vagina: Exam reveals no undue vaginal mucosal pressure of breakdown, no discharge and little vaginal bleeding.    Vaginal Epithelial Abnormality Classification System:   0 0    No abnormalities 1    Epithelial erythema 2    Granulation tissue 3    Epithelial break or erosion, 1 cm or less 4    Epithelial break or erosion, 1 cm or greater   Irrigation completed- no vaginal bleeding or abnormalities noted   Extremities: no edema   Chaperone: [name]    Assessment & Plan:     ICD-10-CM   1. Pessary maintenance  Z46.89     2. OAB (overactive bladder)  N32.81     3. Uterine prolapse  N81.4     4. Vaginal atrophy  N95.2       -Pessary maintenance completed without issues -Reviewed surgical options, she may consider surgery closer to when she wants to retire which will be a few years  No orders of the defined types were placed in this encounter.   Return in about 3 months (around 05/15/2023) for pessary follow up.   [name], DO Attending Obstetrician & Gynecologist, [facility] for [employer], [facility]

## 2023-02-17 ENCOUNTER — Other Ambulatory Visit (HOSPITAL_COMMUNITY): Payer: Self-pay

## 2023-02-19 ENCOUNTER — Other Ambulatory Visit (HOSPITAL_COMMUNITY): Payer: Self-pay

## 2023-02-20 ENCOUNTER — Other Ambulatory Visit: Payer: Self-pay

## 2023-02-20 ENCOUNTER — Other Ambulatory Visit (HOSPITAL_COMMUNITY): Payer: Self-pay

## 2023-02-21 ENCOUNTER — Ambulatory Visit (HOSPITAL_COMMUNITY): Admission: RE | Admit: 2023-02-21 | Payer: 59 | Source: Ambulatory Visit

## 2023-02-21 ENCOUNTER — Ambulatory Visit (HOSPITAL_COMMUNITY)
Admission: RE | Admit: 2023-02-21 | Discharge: 2023-02-21 | Disposition: A | Discharge status: Home / Self Care | Payer: 59 | Source: Ambulatory Visit | Attending: Internal Medicine | Admitting: Internal Medicine

## 2023-02-21 DIAGNOSIS — M8580 Other specified disorders of bone density and structure, unspecified site: Secondary | ICD-10-CM | POA: Insufficient documentation

## 2023-02-21 DIAGNOSIS — Z1231 Encounter for screening mammogram for malignant neoplasm of breast: Secondary | ICD-10-CM | POA: Diagnosis not present

## 2023-03-07 ENCOUNTER — Telehealth: Payer: Self-pay

## 2023-03-07 NOTE — Telephone Encounter (Signed)
Auth Submission: NO AUTH NEEDED Site of care: Site of care: [facility] Payer: [place] [employer] F[employer]  Medication & CPT/J Code(s) submitted: BONIVA J1740 Route of submission (phone, fax, portal): PORTAL Phone # Fax # Auth type: Buy/Bill PB Units/visits requested: 3mg , q77months, 4 doses Reference number:  Approval from: 03/07/23 to 07/15/23

## 2023-03-12 ENCOUNTER — Other Ambulatory Visit: Payer: Self-pay

## 2023-03-12 ENCOUNTER — Other Ambulatory Visit (HOSPITAL_COMMUNITY): Payer: Self-pay

## 2023-03-25 ENCOUNTER — Other Ambulatory Visit (HOSPITAL_COMMUNITY): Payer: Self-pay

## 2023-03-25 ENCOUNTER — Other Ambulatory Visit: Payer: Self-pay

## 2023-03-25 ENCOUNTER — Encounter (HOSPITAL_COMMUNITY): Payer: Self-pay | Admitting: Internal Medicine

## 2023-03-25 MED ORDER — SENNA-DOCUSATE SODIUM 8.6-50 MG PO TABS
2.0000 | ORAL_TABLET | Freq: Every day | ORAL | 5 refills | Status: DC
  Filled 2023-03-25: qty 100, 50d supply, fill #0
  Filled 2023-05-14 (×2): qty 100, 50d supply, fill #1

## 2023-04-07 ENCOUNTER — Telehealth: Payer: 59 | Admitting: Internal Medicine

## 2023-04-07 ENCOUNTER — Encounter: Payer: Self-pay | Admitting: Internal Medicine

## 2023-04-07 DIAGNOSIS — U071 COVID-19: Secondary | ICD-10-CM | POA: Insufficient documentation

## 2023-04-07 MED ORDER — ALBUTEROL SULFATE HFA 108 (90 BASE) MCG/ACT IN AERS
2.0000 | INHALATION_SPRAY | RESPIRATORY_TRACT | 3 refills | Status: AC | PRN
Start: 2023-04-07 — End: ?

## 2023-04-07 MED ORDER — NIRMATRELVIR/RITONAVIR (PAXLOVID)TABLET
3.0000 | ORAL_TABLET | Freq: Two times a day (BID) | ORAL | 0 refills | Status: AC
Start: 2023-04-07 — End: 2023-04-12

## 2023-04-07 NOTE — Progress Notes (Signed)
Virtual Visit via Video Note   Because of [name]'s co-morbid illnesses, she is at least at moderate risk for complications without adequate follow up.  This format is felt to be most appropriate for this patient at this time.  All issues noted in this document were discussed and addressed.  A limited physical exam was performed with this format.      Evaluation Performed:  Follow-up visit  Date:  04/07/2023   ID:  Janice, Coppin [birth year] Higgins, MRN [id number]  Patient Location: Home Provider Location: Office/Clinic  Participants: Patient Location of Patient: Home Location of Provider: Telehealth Consent was obtain for visit to be over via telehealth. I verified that I am speaking with the correct person using two identifiers.  PCP:  Anabel Halon, MD   Chief Complaint: Cough, headache and fever  History of Present Illness:    Janice Higgins is a 67 y.o. female who has a video visit for complaint of cough, headache, fever and fatigue for the last 3 days.  She tested positive for COVID yesterday.  She has mild dyspnea, but denies any wheezing.  Denies any chest pain or palpitations.  She has tried taking DayQuil with mild relief.  The patient does have symptoms concerning for COVID-19 infection (fever, chills, cough, or new shortness of breath).   Past Medical, Surgical, Social History, Allergies, and Medications have been Reviewed.  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   Family history of breast cancer    History of breast cancer 04/25/2015   History of bronchitis    History of kidney stones    Hypertension    Osteoarthritis    hands and feet   Osteoporosis    PMB (postmenopausal bleeding) 04/25/2015   Radiation 10/13/14-11/04/14   Left breast   Past Surgical History:  Procedure Laterality Date   BREAST LUMPECTOMY WITH RADIOACTIVE SEED LOCALIZATION Left 08/12/2014    Procedure:  RADIOACTIVE SEED LOCALIZATION LEFT BREAST LUMPECTOMY;  Surgeon: Glenna Fellows, MD;  Location: [facility];  Service: General;  Laterality: Left;   COLONOSCOPY N/A 05/04/2015   Procedure: COLONOSCOPY;  Surgeon: Malissa Hippo, MD;  Location: [facility];  Service: Endoscopy;  Laterality: N/A;  7:30   FOOT SURGERY Right 1995   exc. bone spur   LASIK     RE-EXCISION OF BREAST LUMPECTOMY Left 08/22/2014   Procedure: RE-EXCISION OF LEFT BREAST LUMPECTOMY;  Surgeon: Glenna Fellows, MD;  Location: [facility];  Service: General;  Laterality: Left;   RE-EXCISION OF BREAST LUMPECTOMY Left 09/05/2014   Procedure: RE-EXCISION OF BREAST LUMPECTOMY;  Surgeon: Glenna Fellows, MD;  Location: [facility];  Service: General;  Laterality: Left;   TONSILLECTOMY     TUBAL LIGATION       No outpatient medications have been marked as taking for the 04/07/23 encounter (Video Visit) with Anabel Halon, MD.     Allergies:   Fosamax [alendronate sodium] and Sulfa antibiotics   ROS:   Please see the history of present illness. All other systems reviewed and are negative.   Labs/Other Tests and Data Reviewed:    Recent Labs: 02/05/2023: ALT 26; BUN 18; Creatinine, Ser 0.69; Hemoglobin 12.9; Platelets 353; Potassium 4.1; Sodium 146; TSH 2.150   Recent Lipid Panel Lab Results  Component Value Date/Time   CHOL 149 07/24/2022 02:19 PM  TRIG 94 07/24/2022 02:19 PM   HDL 56 07/24/2022 02:19 PM   CHOLHDL 2.7 07/24/2022 02:19 PM   CHOLHDL 2.5 12/14/2018 08:24 AM   LDLCALC 76 07/24/2022 02:19 PM    Wt Readings from Last 3 Encounters:  02/12/23 148 lb 9.6 oz (67.4 kg)  02/05/23 148 lb 6.4 oz (67.3 kg)  11/13/22 141 lb 12.8 oz (64.3 kg)     Objective:    Vital Signs:  There were no vitals taken for this visit.   VITAL SIGNS:  reviewed GEN:  no acute distress EYES:  sclerae anicteric, EOMI - Extraocular Movements Intact RESPIRATORY:  normal respiratory  effort, symmetric expansion NEURO:  alert and oriented x 3, no obvious focal deficit PSYCH:  normal affect  ASSESSMENT & PLAN:    COVID-19 Started Paxlovid considering her chronic medical conditions Mucinex or Robitussin as needed for cough Albuterol as needed for mild dyspnea or wheezing Self quarantine for at least 5 days from symptom onset or 24 hours afebrile period which ever is later   I discussed the assessment and treatment plan with the patient. The patient was provided an opportunity to ask questions, and all were answered. The patient agreed with the plan and demonstrated an understanding of the instructions.   The patient was advised to call back or seek an in-person evaluation if the symptoms worsen or if the condition fails to improve as anticipated.  The above assessment and management plan was discussed with the patient. The patient verbalized understanding of and has agreed to the management plan.   Medication Adjustments/Labs and Tests Ordered: Current medicines are reviewed at length with the patient today.  Concerns regarding medicines are outlined above.   Tests Ordered: No orders of the defined types were placed in this encounter.   Medication Changes: No orders of the defined types were placed in this encounter.    Note: This dictation was prepared with Dragon dictation along with smaller phrase technology. Similar sounding words can be transcribed inadequately or may not be corrected upon review. Any transcriptional errors that result from this process are unintentional.      Disposition:  Follow up  Signed, Anabel Halon, MD  04/07/2023 2:41 PM     [facility]

## 2023-04-07 NOTE — Assessment & Plan Note (Signed)
Started Paxlovid considering her chronic medical conditions Mucinex or Robitussin as needed for cough Albuterol as needed for mild dyspnea or wheezing Self quarantine for at least 5 days from symptom onset or 24 hours afebrile period which ever is later

## 2023-04-07 NOTE — Patient Instructions (Addendum)
   Please start taking Paxlovid as prescribed.  Please take Mucinex or Robitussin as needed for cough.

## 2023-04-10 ENCOUNTER — Ambulatory Visit: Payer: 59

## 2023-04-10 ENCOUNTER — Encounter (HOSPITAL_COMMUNITY): Admission: RE | Admit: 2023-04-10 | Payer: 59 | Source: Ambulatory Visit

## 2023-04-11 ENCOUNTER — Other Ambulatory Visit: Payer: Self-pay

## 2023-04-11 ENCOUNTER — Other Ambulatory Visit (HOSPITAL_COMMUNITY): Payer: Self-pay

## 2023-04-11 ENCOUNTER — Encounter: Payer: Self-pay | Admitting: Pharmacist

## 2023-04-15 ENCOUNTER — Encounter: Payer: 59 | Attending: Internal Medicine | Admitting: *Deleted

## 2023-04-15 VITALS — BP 125/74 | HR 74 | Temp 98.1°F | Resp 18

## 2023-04-15 DIAGNOSIS — M8580 Other specified disorders of bone density and structure, unspecified site: Secondary | ICD-10-CM

## 2023-04-15 MED ORDER — IBANDRONATE SODIUM 3 MG/3ML IV SOLN
3.0000 mg | Freq: Once | INTRAVENOUS | Status: AC
  Administered 2023-04-15: 3 mg via INTRAVENOUS

## 2023-04-15 NOTE — Progress Notes (Addendum)
Diagnosis: Osteoporosis  Provider:  Trena Platt MD  Procedure:  IV push  IV Type: Peripheral, IV Location: R Forearm  Boniva (Ibandronate Sodium), 3 mg  Infusion Start Time: 1614  Infusion Stop Time: 1615  Post Infusion IV Care: Observation period completed  Discharge: Condition: Good, Destination: Home . AVS Provided  Performed by:  Daleen Squibb, RN

## 2023-04-28 ENCOUNTER — Other Ambulatory Visit: Payer: Self-pay | Admitting: Internal Medicine

## 2023-04-28 ENCOUNTER — Other Ambulatory Visit (HOSPITAL_COMMUNITY): Payer: Self-pay

## 2023-04-28 DIAGNOSIS — G8929 Other chronic pain: Secondary | ICD-10-CM

## 2023-04-28 MED ORDER — DICLOFENAC SODIUM 75 MG PO TBEC
75.0000 mg | DELAYED_RELEASE_TABLET | Freq: Two times a day (BID) | ORAL | 1 refills | Status: DC
  Filled 2023-04-28: qty 90, 45d supply, fill #0
  Filled 2023-06-09: qty 90, 45d supply, fill #1

## 2023-04-29 ENCOUNTER — Other Ambulatory Visit: Payer: Self-pay

## 2023-04-29 ENCOUNTER — Other Ambulatory Visit (HOSPITAL_COMMUNITY): Payer: Self-pay

## 2023-05-05 ENCOUNTER — Other Ambulatory Visit (HOSPITAL_COMMUNITY): Payer: Self-pay

## 2023-05-05 ENCOUNTER — Other Ambulatory Visit: Payer: Self-pay

## 2023-05-06 ENCOUNTER — Other Ambulatory Visit: Payer: Self-pay

## 2023-05-14 ENCOUNTER — Other Ambulatory Visit (HOSPITAL_COMMUNITY): Payer: Self-pay

## 2023-05-14 ENCOUNTER — Encounter (HOSPITAL_COMMUNITY): Payer: Self-pay | Admitting: Internal Medicine

## 2023-05-15 ENCOUNTER — Ambulatory Visit: Payer: 59 | Admitting: Obstetrics & Gynecology

## 2023-05-20 ENCOUNTER — Other Ambulatory Visit (HOSPITAL_COMMUNITY): Payer: Self-pay

## 2023-05-20 MED ORDER — LUBIPROSTONE 8 MCG PO CAPS
8.0000 ug | ORAL_CAPSULE | Freq: Two times a day (BID) | ORAL | 3 refills | Status: DC
  Filled 2023-05-20 – 2023-05-22 (×2): qty 60, 30d supply, fill #0

## 2023-05-21 ENCOUNTER — Encounter: Payer: Self-pay | Admitting: Obstetrics & Gynecology

## 2023-05-21 ENCOUNTER — Ambulatory Visit: Payer: 59 | Admitting: Obstetrics & Gynecology

## 2023-05-21 VITALS — BP 104/59 | HR 80 | Ht 61.0 in | Wt 151.8 lb

## 2023-05-21 DIAGNOSIS — N814 Uterovaginal prolapse, unspecified: Secondary | ICD-10-CM | POA: Diagnosis not present

## 2023-05-21 DIAGNOSIS — Z4689 Encounter for fitting and adjustment of other specified devices: Secondary | ICD-10-CM

## 2023-05-21 DIAGNOSIS — N952 Postmenopausal atrophic vaginitis: Secondary | ICD-10-CM | POA: Diagnosis not present

## 2023-05-21 DIAGNOSIS — N3281 Overactive bladder: Secondary | ICD-10-CM | POA: Diagnosis not present

## 2023-05-21 NOTE — Progress Notes (Signed)
     GYN VISIT Patient name: Janice Higgins MRN [id number]  Date of birth: 10-31-1955 Chief Complaint:    Chief Complaint  Patient presents with   Pessary Check   History of Present Illness:   Janice Higgins is a 67 y.o. G1P1 PM female being seen today for pessary maintenance.     She uses a Gelhorn #3  The discharge doesn't really bother her, but rather the vaginal odor.  She also may see a little pink spotting, mostly after she has the device replaced.  Denies BRB.  Denies pelvic or abdominal pain.  Overall doing well and reports no acute complaints.  Likert scale(1 not bothersome -5 very bothersome)  :  1  -Did not discuss OAB today- taking Detrol LA  Review of Systems:   Pertinent items are noted in HPI Denies fever/chills, dizziness, headaches, visual disturbances, fatigue, shortness of breath, chest pain, abdominal pain, vomiting.  She has noted some constipation since starting a new medication. Pertinent History Reviewed:  Reviewed past medical,surgical, social, obstetrical and family history.  Reviewed problem list, medications and allergies. Physical Assessment:   Vitals:   05/21/23 1553  BP: (!) 104/59  Pulse: 80  Weight: 151 lb 12.8 oz (68.9 kg)  Height: 5\' 1"  (1.549 m)  Body mass index is 28.68 kg/m.       Physical Examination:   General appearance: alert, well appearing, and in no distress  Psych: mood appropriate, normal affect  Skin: warm & dry   Cardiovascular: normal heart rate noted  Respiratory: normal respiratory effort, no distress  Abdomen: soft, non-tender   GU: normal external genitalia.   The pessary is removed using ring forcep. Vagina: Exam reveals small area of granulation tissue posterior cul-de-sac- no bleeding or erosion noted.  Normal appearing cervix- no lesions or bleeding noted.  Vaginal Epithelial Abnormality Classification System:   2 0    No abnormalities 1    Epithelial erythema 2    Granulation tissue 3    Epithelial break or  erosion, 1 cm or less 4    Epithelial break or erosion, 1 cm or greater   Irrigation completed- no vaginal bleeding.  Gelhorn replaced with mild difficulty.  Extremities: no edema   Chaperone: Janice Higgins    Assessment & Plan:     ICD-10-CM   1. Pessary maintenance  Z46.89     2. OAB (overactive bladder)  N32.81     3. Uterine prolapse  N81.4     4. Vaginal atrophy  N95.2       -Reviewed conservative versus surgical intervention -Patient is still working and wishes to hold off on surgical intervention until she has retired -Encouraged patient to continue with vaginal estrogen cream twice daily -Will plan to not go longer than 3 months before her next pessary maintenance  No orders of the defined types were placed in this encounter.   Return in about 3 months (around 08/21/2023) for pessary maintenance.   Janice Hidalgo, DO Attending Obstetrician & Gynecologist, [facility] for [employer], [facility]

## 2023-05-22 ENCOUNTER — Other Ambulatory Visit: Payer: Self-pay

## 2023-06-05 ENCOUNTER — Other Ambulatory Visit (HOSPITAL_COMMUNITY): Payer: Self-pay

## 2023-06-06 ENCOUNTER — Other Ambulatory Visit (HOSPITAL_COMMUNITY): Payer: Self-pay

## 2023-06-09 ENCOUNTER — Other Ambulatory Visit (HOSPITAL_COMMUNITY): Payer: Self-pay

## 2023-06-30 ENCOUNTER — Other Ambulatory Visit: Payer: Self-pay

## 2023-07-06 ENCOUNTER — Ambulatory Visit
Admission: EM | Admit: 2023-07-06 | Discharge: 2023-07-06 | Disposition: A | Discharge status: Home / Self Care | Payer: 59 | Attending: Nurse Practitioner | Admitting: Nurse Practitioner

## 2023-07-06 DIAGNOSIS — H1033 Unspecified acute conjunctivitis, bilateral: Secondary | ICD-10-CM

## 2023-07-06 DIAGNOSIS — J0140 Acute pansinusitis, unspecified: Secondary | ICD-10-CM | POA: Diagnosis not present

## 2023-07-06 MED ORDER — OLOPATADINE HCL 0.2 % OP SOLN: 1.0000 [drp] | Freq: Two times a day (BID) | OPHTHALMIC | 0 refills | Status: DC

## 2023-07-06 MED ORDER — FLUTICASONE PROPIONATE 50 MCG/ACT NA SUSP: 2.0000 | Freq: Every day | NASAL | 0 refills | Status: DC

## 2023-07-06 MED ORDER — AMOXICILLIN-POT CLAVULANATE 875-125 MG PO TABS: 1.0000 | ORAL_TABLET | Freq: Two times a day (BID) | ORAL | 0 refills | Status: DC

## 2023-07-06 NOTE — Discharge Instructions (Addendum)
Take medication as directed. Start over-the-counter cetirizine 10 mg daily while symptoms persist. Increase fluids and get plenty of rest. May take over-the-counter Tylenol as needed for pain, fever, or general discomfort. Apply cool compresses to the eyes to help with pain and swelling.  You may apply warm compresses as needed for pain or discomfort. Recommend normal saline nasal spray to help with nasal congestion throughout the day. For your cough, it may be helpful to use a humidifier at bedtime during sleep. As discussed, if your symptoms have not improved over the next 24 to 48 hours, or appear to be worsening, please follow-up in this clinic or with your primary care physician for further evaluation. Follow-up as needed.

## 2023-07-06 NOTE — ED Provider Notes (Signed)
[facility]    CSN: [id number] Arrival date & time: 07/06/23  0932      History   Chief Complaint No chief complaint on file.   HPI Janice Higgins is a 67 y.o. female.   The history is provided by the patient.   Patient presents for complaints of swelling, drainage, and tenderness in both eyes.  Patient reports symptoms started over the past 24 hours.  She states that she has had an upper respiratory infection over the past week.  She states that she thought symptoms were improving.  She does have pain in her sinuses along with moderate head congestion.  Reports that she did try an over-the-counter Alka-Seltzer medication for symptoms, and wonders if she may be having an allergic reaction to this.  She states that she is having difficulty seeing because of the swelling in her eyes and the inability to open her eyes.  She denies fever, chills, runny nose, cough, chest pain, abdominal pain, nausea, vomiting, diarrhea, or rash.  Patient further denies exposures to new soaps, medications, lotions, detergents, or foods.  Reports that she does use reading glasses.  Denies any obvious known sick contacts.  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   Family history of breast cancer    History of breast cancer 04/25/2015   History of bronchitis    History of kidney stones    Hypertension    Osteoarthritis    hands and feet   Osteoporosis    PMB (postmenopausal bleeding) 04/25/2015   Radiation 10/13/14-11/04/14   Left breast    Patient Active Problem List   Diagnosis Date Noted   COVID-19 04/07/2023   Allergic contact dermatitis 02/05/2023   Overactive bladder 09/30/2022   Prediabetes 07/26/2022   Chronic constipation 04/23/2022   Encounter for general adult medical examination with abnormal findings 01/21/2022   Obesity 01/21/2022   Osteopenia 08/21/2021   Compression fracture of  thoracic vertebra with routine healing 01/28/2021   Essential hypertension 07/19/2020   Gastroesophageal reflux disease without esophagitis 02/15/2020   Chronic low back pain 01/01/2020   Unilateral primary osteoarthritis, right knee 03/11/2018   PMB (postmenopausal bleeding) 04/25/2015   History of breast cancer 04/25/2015   Family history of breast cancer    Breast cancer, left breast (HCC) 08/30/2014   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 bone spur   LASIK     RE-EXCISION OF BREAST LUMPECTOMY Left 08/22/2014   Procedure: RE-EXCISION OF LEFT BREAST LUMPECTOMY;  Surgeon: Glenna Fellows, MD;  Location: [facility];  Service: General;  Laterality: Left;   RE-EXCISION OF BREAST LUMPECTOMY Left 09/05/2014   Procedure: RE-EXCISION OF BREAST LUMPECTOMY;  Surgeon: Glenna Fellows, MD;  Location:  SURGERY CENTER;  Service: General;  Laterality: Left;   TONSILLECTOMY     TUBAL LIGATION      OB History  Gravida  1   Para  1   Term      Preterm      AB      Living  1      SAB      IAB      Ectopic      Multiple      Live Births               Home Medications    Prior to Admission medications   Medication Sig Start Date End Date Taking? Authorizing Provider  amoxicillin-clavulanate (AUGMENTIN) 875-125 MG tablet Take 1 tablet by mouth every 12 (twelve) hours. 07/06/23  Yes Leath-Warren, Sadie Haber, NP   fluticasone (FLONASE) 50 MCG/ACT nasal spray Place 2 sprays into both nostrils daily. 07/06/23  Yes Leath-Warren, Sadie Haber, NP  Olopatadine HCl (PATADAY) 0.2 % SOLN Apply 1 drop to eye 2 (two) times daily. 07/06/23  Yes Leath-Warren, Sadie Haber, NP  albuterol (VENTOLIN HFA) 108 (90 Base) MCG/ACT inhaler Inhale 2 puffs into the lungs every 4 (four) hours as needed for wheezing or shortness of breath. 04/07/23   Anabel Halon, MD  amLODipine (NORVASC) 5 MG tablet Take 1 tablet (5 mg total) by mouth daily. 02/05/23   Anabel Halon, MD  diclofenac (VOLTAREN) 75 MG EC tablet Take 1 tablet (75 mg total) by mouth 2 (two) times daily. 04/28/23   Anabel Halon, MD  estradiol (ESTRACE VAGINAL) 0.1 MG/GM vaginal cream Pea-sized amount once per week 11/13/22   [name], DO  famotidine (PEPCID) 40 MG tablet Take 1 tablet (40 mg total) by mouth daily. 02/04/23   Anabel Halon, MD  HYDROcodone-acetaminophen (NORCO/VICODIN) 5-325 MG tablet Take 1 tablet by mouth at bedtime as needed for pain 02/05/23   Anabel Halon, MD  ibandronate (BONIVA) 3 MG/3ML SOLN injection Inject 3 mLs (3 mg total) into the vein every 3 (three) months. 08/21/21   Anabel Halon, MD  lisinopril (ZESTRIL) 5 MG tablet Take 1 tablet (5 mg total) by mouth daily. 02/05/23   Anabel Halon, MD  lubiprostone (AMITIZA) 8 MCG capsule Take 1 capsule (8 mcg total) by mouth 2 (two) times daily before a meal. 05/19/23     methocarbamol (ROBAXIN) 750 MG tablet Take 1 tablet (750 mg total) by mouth at bedtime. 11/08/21   Vickki Hearing, MD  sennosides-docusate sodium (SENOKOT-S) 8.6-50 MG tablet Take 2 tablets by mouth daily. 03/24/23     tolterodine (DETROL LA) 2 MG 24 hr capsule Take 1 capsule (2 mg total) by mouth daily. 11/13/22 07/10/23  [name], DO  triamcinolone cream (KENALOG) 0.1 % Apply 1 Application topically 2 (two) times daily. 02/05/23   Anabel Halon, MD    Family History Family History  Problem Relation Age of Onset    Hypertension Father    Hypertension Mother    Heart disease Mother 28   Breast cancer Sister 57   Other Brother        lived 3 months   Heart attack Maternal Grandfather    Emphysema Paternal Grandfather    COPD Paternal Grandfather     Social History Social History   Tobacco Use   Smoking status: Never   Smokeless tobacco: Never  Vaping Use   Vaping status: Never Used  Substance Use Topics   Alcohol use: No   Drug use: No     Allergies   Fosamax [alendronate sodium] and Sulfa antibiotics   Review of Systems  Review of Systems Per HPI  Physical Exam Triage Vital Signs ED Triage Vitals  Encounter Vitals Group     BP 07/06/23 0951 (!) 149/83     Systolic BP Percentile --      Diastolic BP Percentile --      Pulse Rate 07/06/23 0951 93     Resp 07/06/23 0951 18     Temp 07/06/23 0951 98.2 F (36.8 C)     Temp Source 07/06/23 0951 Oral     SpO2 07/06/23 0951 97 %     Weight --      Height --      Head Circumference --      Peak Flow --      Pain Score 07/06/23 0955 3     Pain Loc --      Pain Education --      Exclude from Growth Chart --    No data found.  Updated Vital Signs BP (!) 149/83 (BP Location: Right Arm)   Pulse 93   Temp 98.2 F (36.8 C) (Oral)   Resp 18   SpO2 97%   Visual Acuity Right Eye Distance: 20/50 Left Eye Distance: 20/70 Bilateral Distance: 20/50  Right Eye Near:   Left Eye Near:    Bilateral Near:     Physical Exam Vitals and nursing note reviewed.  Constitutional:      General: She is not in acute distress.    Appearance: Normal appearance.  HENT:     Head: Normocephalic.     Right Ear: Tympanic membrane, ear canal and external ear normal.     Left Ear: Tympanic membrane, ear canal and external ear normal.     Nose: Congestion present.     Right Turbinates: Enlarged and swollen.     Left Turbinates: Enlarged and swollen.     Right Sinus: Maxillary sinus tenderness and frontal sinus tenderness present.      Left Sinus: Maxillary sinus tenderness and frontal sinus tenderness present.     Mouth/Throat:     Lips: Pink.     Mouth: Mucous membranes are moist.     Pharynx: Postnasal drip present. No pharyngeal swelling, oropharyngeal exudate, posterior oropharyngeal erythema or uvula swelling.  Eyes:     General: Vision grossly intact. Gaze aligned appropriately. Allergic shiner present. No visual field deficit.       Right eye: Discharge present. No foreign body or hordeolum.        Left eye: Discharge present.No foreign body or hordeolum.     Extraocular Movements: Extraocular movements intact.     Right eye: Normal extraocular motion and no nystagmus.     Left eye: Normal extraocular motion and no nystagmus.     Conjunctiva/sclera:     Right eye: Right conjunctiva is injected. No chemosis, exudate or hemorrhage.    Left eye: Left conjunctiva is injected. No chemosis, exudate or hemorrhage.    Pupils: Pupils are equal, round, and reactive to light.     Comments: Periorbital swelling noted to the left lower eye.  Cardiovascular:     Rate and Rhythm: Normal rate and regular rhythm.     Pulses: Normal pulses.     Heart sounds: Normal heart sounds.  Pulmonary:     Effort: Pulmonary effort is normal. No respiratory distress.     Breath sounds: Normal breath sounds. No stridor. No wheezing, rhonchi or rales.  Chest:     Chest wall: No tenderness.  Abdominal:     General:  Bowel sounds are normal.     Palpations: Abdomen is soft.     Tenderness: There is no abdominal tenderness.  Musculoskeletal:     Cervical back: Normal range of motion.  Lymphadenopathy:     Cervical: No cervical adenopathy.  Skin:    General: Skin is warm and dry.  Neurological:     General: No focal deficit present.     Mental Status: She is alert and oriented to person, place, and time.  Psychiatric:        Mood and Affect: Mood normal.        Behavior: Behavior normal.      UC Treatments / Results  Labs (all  labs ordered are listed, but only abnormal results are displayed) Labs Reviewed - No data to display  EKG   Radiology No results found.  Procedures Procedures (including critical care time)  Medications Ordered in UC Medications - No data to display  Initial Impression / Assessment and Plan / UC Course  I have reviewed the triage vital signs and the nursing notes.  Pertinent labs & imaging results that were available during my care of the patient were reviewed by me and considered in my medical decision making (see chart for details).  On exam, patient with moderate tenderness to her maxillary and frontal sinuses.  She now has swelling and redness in both eyes.  Symptoms consistent with acute pansinusitis.  Will provide treatment with Augmentin 875/125 mg tablets, fluticasone 50 mcg nasal spray for nasal congestion, and Pataday 0.2% solution for her eyes.  Supportive care recommendations were provided and discussed with the patient to include over-the-counter Tylenol for pain, fluids, rest, cool compresses to the eyes to help with swelling, and use of normal saline nasal spray.  Patient was advised that if symptoms are not improving over the next 24 to 48 hours, would like her to follow-up in this clinic or with her primary care physician for further evaluation.  Patient was in agreement with this plan of care and verbalized understanding.  All questions were answered.  Patient stable for discharge.  Work note was provided.  Final Clinical Impressions(s) / UC Diagnoses   Final diagnoses:  Acute pansinusitis, recurrence not specified  Acute conjunctivitis of both eyes, unspecified acute conjunctivitis type     Discharge Instructions      Take medication as directed. Start over-the-counter cetirizine 10 mg daily while symptoms persist. Increase fluids and get plenty of rest. May take over-the-counter Tylenol as needed for pain, fever, or general discomfort. Apply cool compresses to  the eyes to help with pain and swelling.  You may apply warm compresses as needed for pain or discomfort. Recommend normal saline nasal spray to help with nasal congestion throughout the day. For your cough, it may be helpful to use a humidifier at bedtime during sleep. As discussed, if your symptoms have not improved over the next 24 to 48 hours, or appear to be worsening, please follow-up in this clinic or with your primary care physician for further evaluation. Follow-up as needed.     ED Prescriptions     Medication Sig Dispense Auth. Provider   amoxicillin-clavulanate (AUGMENTIN) 875-125 MG tablet Take 1 tablet by mouth every 12 (twelve) hours. 14 tablet Leath-Warren, Sadie Haber, NP   fluticasone (FLONASE) 50 MCG/ACT nasal spray Place 2 sprays into both nostrils daily. 16 g Leath-Warren, Sadie Haber, NP   Olopatadine HCl (PATADAY) 0.2 % SOLN Apply 1 drop to eye 2 (two) times daily. 5  mL Leath-Warren, Sadie Haber, NP      PDMP not reviewed this encounter.   Abran Cantor, NP 07/06/23 1120

## 2023-07-06 NOTE — ED Triage Notes (Addendum)
Pt reports having a cold x 1 week and taking alka seltzer cold and flu tablets for sx's. Woke up x 1 days ago with swelling, drainage, redness and tenderness ion both eyes  Pt has used warm compresses to help with the swelling.

## 2023-07-07 ENCOUNTER — Ambulatory Visit: Payer: Self-pay | Admitting: Internal Medicine

## 2023-07-07 NOTE — Telephone Encounter (Signed)
Copied from CRM [id number]. Topic: Clinical - Red Word Triage >> Jul 07, 2023  3:30 PM Fuller Mandril wrote: Red Word that prompted transfer to Nurse Triage: Pt had swelling in eyes and loss of eye sight. Went to urgent care swelling is going down but is concerned about eyesight   Chief Complaint: eye problem Symptoms: swelling , itchiness, and redness Frequency: started 1 day ago Pertinent Negatives: Patient denies fever Disposition: [] ED /[x] Urgent Care (no appt availability in office) / [] Appointment(In office/virtual)/ []  [facility]/ [] Home Care/ [] Refused Recommended Disposition /[] [facility]/ []  Follow-up with PCP Additional Notes: Patient reports sinus infection that started 2 days ago, states that she noticed swelling around her eyelid 1 day ago. Seen at [facility] and prescribed Augmentin, Flonase, and Pattaday eye drops. Pt was advised to call back if symptoms persisted. No available appts within the next 24 hours and patient is unable to go to another clinic at this time. RN advised patient on protocol care advice and that she needs to be seen within the next 24 hours. Pt agreeable to go back to Urgent Care.      Reason for Disposition  [1] SEVERE eyelid swelling (i.e., shut or almost) AND [2] involves both eyes AND [3] itchy  Answer Assessment - Initial Assessment Questions 1. ONSET: "When did the swelling start?" (e.g., minutes, hours, days)     Swelling started yesterday morning around 4am  2. LOCATION: "What part of the eyelids is swollen?"     Eyelids are swollen and reddened  3. SEVERITY: "How swollen is it?"     Significant swelling, blurred vision  4. ITCHING: "Is there any itching?" If Yes, ask: "How much?"   (Scale 1-10; mild, moderate or severe)     There was itching 2 days ago  5. PAIN: "Is the swelling painful to touch?" If Yes, ask: "How painful is it?"   (Scale 1-10; mild, moderate or severe)     Moderate pain, sore to touch  6.  FEVER: "Do you have a fever?" If Yes, ask: "What is it, how was it measured, and when did it start?"      No fevers  7. CAUSE: "What do you think is causing the swelling?"     Severe sinus infection  8. RECURRENT SYMPTOM: "Have you had eyelid swelling before?" If Yes, ask: "When was the last time?" "What happened that time?"     Never happened before  9. OTHER SYMPTOMS: "Do you have any other symptoms?" (e.g., blurred vision, eye discharge, rash, runny nose)     Eye discharge, blurry vision, cough, raspy voice  Protocols used: Eye - Swelling-A-AH

## 2023-07-10 ENCOUNTER — Other Ambulatory Visit: Payer: Self-pay

## 2023-07-10 ENCOUNTER — Encounter: Payer: Self-pay | Admitting: Internal Medicine

## 2023-07-10 ENCOUNTER — Other Ambulatory Visit: Payer: Self-pay | Admitting: Internal Medicine

## 2023-07-10 ENCOUNTER — Other Ambulatory Visit (HOSPITAL_COMMUNITY): Payer: Self-pay

## 2023-07-10 DIAGNOSIS — J0140 Acute pansinusitis, unspecified: Secondary | ICD-10-CM

## 2023-07-10 DIAGNOSIS — H1033 Unspecified acute conjunctivitis, bilateral: Secondary | ICD-10-CM

## 2023-07-10 DIAGNOSIS — R059 Cough, unspecified: Secondary | ICD-10-CM

## 2023-07-10 MED ORDER — OFLOXACIN 0.3 % OP SOLN
1.0000 [drp] | Freq: Four times a day (QID) | OPHTHALMIC | 0 refills | Status: DC
Start: 2023-07-10 — End: 2023-08-11

## 2023-07-10 MED ORDER — GUAIFENESIN-CODEINE 100-10 MG/5ML PO SOLN
5.0000 mL | Freq: Three times a day (TID) | ORAL | 0 refills | Status: DC | PRN
Start: 2023-07-10 — End: 2023-08-07

## 2023-07-14 DIAGNOSIS — H2513 Age-related nuclear cataract, bilateral: Secondary | ICD-10-CM | POA: Diagnosis not present

## 2023-07-17 ENCOUNTER — Other Ambulatory Visit: Payer: Self-pay

## 2023-07-17 ENCOUNTER — Ambulatory Visit: Payer: 59

## 2023-07-17 ENCOUNTER — Encounter: Payer: 59 | Attending: Internal Medicine | Admitting: *Deleted

## 2023-07-17 VITALS — BP 127/89 | HR 84 | Temp 97.5°F | Resp 18

## 2023-07-17 DIAGNOSIS — M8580 Other specified disorders of bone density and structure, unspecified site: Secondary | ICD-10-CM | POA: Diagnosis not present

## 2023-07-17 MED ORDER — IBANDRONATE SODIUM 3 MG/3ML IV SOLN
3.0000 mg | Freq: Once | INTRAVENOUS | Status: AC
  Administered 2023-07-17: 3 mg via INTRAVENOUS

## 2023-07-17 NOTE — Progress Notes (Signed)
 Diagnosis: Osteoporosis  Provider:  Tobie Downs MD  Procedure: IV Push  IV Type: Peripheral, IV Location: L Hand  Boniva  (Ibandronate  Sodium), Dose: 3 mg  Post Infusion IV Care: Observation period completed  Discharge: Condition: Good, Destination: Home . AVS Provided  Performed by:  Baldwin Darice Helling, RN

## 2023-07-18 ENCOUNTER — Telehealth: Payer: Self-pay

## 2023-07-18 NOTE — Telephone Encounter (Signed)
 Auth Submission: NO AUTH NEEDED Site of care: Site of care: [facility] Payer: [employer]  Medication & CPT/J Code(s) submitted: Boniva  J1740 Route of submission (phone, fax, portal): portal Phone # Fax # Auth type: Buy/Bill PB Units/visits requested: 3mg , 4 visits Reference number:  Approval from: 07/18/23 to 07/14/24

## 2023-07-24 DIAGNOSIS — H16223 Keratoconjunctivitis sicca, not specified as Sjogren's, bilateral: Secondary | ICD-10-CM | POA: Diagnosis not present

## 2023-07-31 ENCOUNTER — Encounter: Payer: Self-pay | Admitting: Internal Medicine

## 2023-08-04 ENCOUNTER — Other Ambulatory Visit: Payer: Self-pay | Admitting: Internal Medicine

## 2023-08-04 DIAGNOSIS — Z0000 Encounter for general adult medical examination without abnormal findings: Secondary | ICD-10-CM

## 2023-08-05 ENCOUNTER — Other Ambulatory Visit (HOSPITAL_COMMUNITY): Payer: Self-pay

## 2023-08-05 ENCOUNTER — Encounter: Payer: Self-pay | Admitting: Internal Medicine

## 2023-08-05 MED ORDER — FAMOTIDINE 40 MG PO TABS
40.0000 mg | ORAL_TABLET | Freq: Every day | ORAL | 5 refills | Status: DC
  Filled 2023-08-05: qty 30, 30d supply, fill #0
  Filled 2023-09-08: qty 30, 30d supply, fill #1
  Filled 2023-10-06: qty 30, 30d supply, fill #2
  Filled 2023-11-03: qty 30, 30d supply, fill #3
  Filled 2023-12-05: qty 30, 30d supply, fill #4
  Filled 2024-01-11: qty 30, 30d supply, fill #5

## 2023-08-07 ENCOUNTER — Other Ambulatory Visit (HOSPITAL_COMMUNITY)
Admission: RE | Admit: 2023-08-07 | Discharge: 2023-08-07 | Disposition: A | Discharge status: Home / Self Care | Payer: Commercial Managed Care - PPO | Source: Ambulatory Visit | Attending: Obstetrics & Gynecology | Admitting: Obstetrics & Gynecology

## 2023-08-07 ENCOUNTER — Encounter: Payer: Self-pay | Admitting: Obstetrics & Gynecology

## 2023-08-07 ENCOUNTER — Ambulatory Visit: Payer: Commercial Managed Care - PPO | Admitting: Obstetrics & Gynecology

## 2023-08-07 VITALS — BP 107/63 | HR 85 | Ht 61.0 in | Wt 155.8 lb

## 2023-08-07 DIAGNOSIS — Z4689 Encounter for fitting and adjustment of other specified devices: Secondary | ICD-10-CM

## 2023-08-07 DIAGNOSIS — N814 Uterovaginal prolapse, unspecified: Secondary | ICD-10-CM

## 2023-08-07 DIAGNOSIS — N3281 Overactive bladder: Secondary | ICD-10-CM | POA: Diagnosis not present

## 2023-08-07 DIAGNOSIS — N898 Other specified noninflammatory disorders of vagina: Secondary | ICD-10-CM

## 2023-08-07 DIAGNOSIS — N952 Postmenopausal atrophic vaginitis: Secondary | ICD-10-CM

## 2023-08-07 NOTE — Addendum Note (Signed)
Addended by: Annamarie Dawley on: 08/07/2023 04:20 PM   Modules accepted: Orders

## 2023-08-07 NOTE — Progress Notes (Signed)
     GYN VISIT Patient name: Janice Higgins MRN [id number]  Date of birth: 08/21/1955 Chief Complaint:    Chief Complaint  Patient presents with   Pessary Check   History of Present Illness:   Janice Higgins is a 68 y.o. G1P1 PM female being seen today for pessary maintenance.     She uses a Gelhorn #3 She reports moderate vaginal discharge with odor and little vaginal bleeding   More bothersome than previously.  Of note, surgery is not an option right now as she needs to have cataract surgery first  Likert scale(1 not bothersome -5 very bothersome)  :  2  Review of Systems:   Pertinent items are noted in HPI Denies fever/chills, dizziness, headaches, visual disturbances, fatigue, shortness of breath, chest pain, abdominal pain, vomiting. Pertinent History Reviewed:  Reviewed past medical,surgical, social, obstetrical and family history.  Reviewed problem list, medications and allergies. Physical Assessment:   Vitals:   08/07/23 1514  BP: 107/63  Pulse: 85  Weight: 155 lb 12.8 oz (70.7 kg)  Height: 5\' 1"  (1.549 m)  Body mass index is 29.44 kg/m.       Physical Examination:   General appearance: alert, well appearing, and in no distress  Psych: mood appropriate, normal affect  Skin: warm & dry   Cardiovascular: normal heart rate noted  Respiratory: normal respiratory effort, no distress  Abdomen: soft, non-tender   GU: normal external genitalia.   The pessary is removed. Vagina: Exam reveals no undue vaginal mucosal pressure of breakdown, moderate discharge and little vaginal bleeding.  ON left side of uterus- granulation tissue with slight spotting noted.  Additional irritation noted posteriorly.  No evidnce of erosion  Vaginal Epithelial Abnormality Classification System:   2 0    No abnormalities 1    Epithelial erythema 2    Granulation tissue 3    Epithelial break or erosion, 1 cm or less 4    Epithelial break or erosion, 1 cm or greater   Irrigation completed.   Vaginitis swab obtained  Extremities: no edema   Chaperone: [name]    Assessment & Plan:     ICD-10-CM   1. Pessary maintenance  Z46.89     2. OAB (overactive bladder)  N32.81     3. Uterine prolapse  N81.4     4. Vaginal atrophy  N95.2     5. Vaginal discharge  N89.8       -Advised pessary "vacation" would advise at least 2-6wk break.  Try to go as long as possible without replacement -if she cannot wait until March RTC for replacement -await results of vaginal swab for treatment   Return for 1st week March with Johathan Province- pessary .   [name], DO Attending Obstetrician & Gynecologist, [facility] for [employer], [facility]

## 2023-08-10 ENCOUNTER — Encounter: Payer: Self-pay | Admitting: Obstetrics & Gynecology

## 2023-08-10 ENCOUNTER — Other Ambulatory Visit: Payer: Self-pay | Admitting: Obstetrics & Gynecology

## 2023-08-10 DIAGNOSIS — N760 Acute vaginitis: Secondary | ICD-10-CM

## 2023-08-10 LAB — CERVICOVAGINAL ANCILLARY ONLY
Bacterial Vaginitis (gardnerella): POSITIVE — AB
Candida Glabrata: NEGATIVE
Candida Vaginitis: POSITIVE — AB
Comment: NEGATIVE
Comment: NEGATIVE
Comment: NEGATIVE

## 2023-08-10 MED ORDER — METRONIDAZOLE 500 MG PO TABS: 500.0000 mg | ORAL_TABLET | Freq: Two times a day (BID) | ORAL | 0 refills | Status: AC

## 2023-08-10 MED ORDER — FLUCONAZOLE 150 MG PO TABS: ORAL_TABLET | ORAL | 0 refills | Status: DC

## 2023-08-11 ENCOUNTER — Encounter: Payer: Self-pay | Admitting: Internal Medicine

## 2023-08-11 ENCOUNTER — Ambulatory Visit: Payer: Commercial Managed Care - PPO | Admitting: Internal Medicine

## 2023-08-11 ENCOUNTER — Other Ambulatory Visit (HOSPITAL_COMMUNITY): Payer: Self-pay

## 2023-08-11 VITALS — BP 111/68 | HR 85 | Ht 61.0 in | Wt 154.8 lb

## 2023-08-11 DIAGNOSIS — N814 Uterovaginal prolapse, unspecified: Secondary | ICD-10-CM | POA: Diagnosis not present

## 2023-08-11 DIAGNOSIS — R7303 Prediabetes: Secondary | ICD-10-CM

## 2023-08-11 DIAGNOSIS — K219 Gastro-esophageal reflux disease without esophagitis: Secondary | ICD-10-CM

## 2023-08-11 DIAGNOSIS — N3281 Overactive bladder: Secondary | ICD-10-CM | POA: Diagnosis not present

## 2023-08-11 DIAGNOSIS — I10 Essential (primary) hypertension: Secondary | ICD-10-CM | POA: Diagnosis not present

## 2023-08-11 DIAGNOSIS — M8580 Other specified disorders of bone density and structure, unspecified site: Secondary | ICD-10-CM

## 2023-08-11 NOTE — Assessment & Plan Note (Signed)
Followed by OB/GYN Has been advised for surgical repair, she is thinking about it

## 2023-08-11 NOTE — Patient Instructions (Signed)
Please continue to take medications as prescribed.  Please continue to follow DASH diet and perform moderate exercise/walking as tolerated.

## 2023-08-11 NOTE — Progress Notes (Signed)
Established Patient Office Visit  Subjective:  Patient ID: Janice Higgins, female    DOB: Dec 08, 1955  Age: 68 y.o. MRN: [id number]  CC:  Chief Complaint  Patient presents with   Hypertension    6 month f/u    HPI Janice Higgins is a 68 y.o. female with past medical history of HTN, GERD, osteopenia, breast ca. s/p lumpectomy, OA and obesity who presents for annual physical.  HTN: BP is well-controlled. Takes medications regularly. Patient denies headache, dizziness, chest pain, dyspnea or palpitations.  Her DEXA scan showed osteopenia.  She has tried oral bisphosphonate, but had severe esophagitis with it.  She has been tolerating Boniva well.  She takes Pepcid for GERD now.  She complains of worsening of acid reflux since discontinuing Protonix.  Denies any nausea, vomiting, melena or hematochezia.  She is concerned about her weight.  She had lost more than 40 pounds with Wegovy, but has gained about 15 pounds since stopping the [facility] in 04/24.  She states that she has been trying to control her urges for eating, but is difficult at times.  She has had improvement in urinary urgency with pessary placement, but was recently removed for pessary-free interval.  She has had recurrence of urinary incontinence now.  She takes Detrol for it, but is not effective.  She is advised for surgical repair of uterine prolapse by her OB/GYN.  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   Family history of breast cancer    History of breast cancer 04/25/2015   History of bronchitis    History of kidney stones    Hypertension    Osteoarthritis    hands and feet   Osteoporosis    PMB (postmenopausal bleeding) 04/25/2015   Radiation 10/13/14-11/04/14   Left breast    Past Surgical History:  Procedure Laterality Date   BREAST LUMPECTOMY WITH RADIOACTIVE SEED LOCALIZATION Left 08/12/2014   Procedure:  RADIOACTIVE  SEED LOCALIZATION LEFT BREAST LUMPECTOMY;  Surgeon: Glenna Fellows, MD;  Location: [facility];  Service: General;  Laterality: Left;   COLONOSCOPY N/A 05/04/2015   Procedure: COLONOSCOPY;  Surgeon: Malissa Hippo, MD;  [facility];  Service: Endoscopy;  Laterality: N/A;  7:30   FOOT SURGERY Right 1995   exc. bone spur   LASIK     RE-EXCISION OF BREAST LUMPECTOMY Left 08/22/2014   Procedure: RE-EXCISION OF LEFT BREAST LUMPECTOMY;  Surgeon: Glenna Fellows, MD;  Location: [facility];  Service: General;  Laterality: Left;   RE-EXCISION OF BREAST LUMPECTOMY Left 09/05/2014   Procedure: RE-EXCISION OF BREAST LUMPECTOMY;  Surgeon: Glenna Fellows, MD;  Location: [facility];  Service: General;  Laterality: Left;   TONSILLECTOMY     TUBAL LIGATION      Family History  Problem Relation Age of Onset   Hypertension Father    Hypertension Mother    Heart disease Mother 62   Breast cancer Sister 66   Other Brother        lived 3 months   Heart attack Maternal Grandfather    Emphysema Paternal Grandfather    COPD Paternal Grandfather     Social History   Socioeconomic History   Marital status: Married    Spouse name: Not on file   Number of children: Not on file   Years of education: Not on file   Highest  education level: Not on file  Occupational History   Not on file  Tobacco Use   Smoking status: Never   Smokeless tobacco: Never  Vaping Use   Vaping status: Never Used  Substance and Sexual Activity   Alcohol use: No   Drug use: No   Sexual activity: Yes    Birth control/protection: Post-menopausal  Other Topics Concern   Not on file  Social History Narrative   Not on file   Social Drivers of Health   Financial Resource Strain: Low Risk  (10/16/2022)   Overall Financial Resource Strain (CARDIA)    Difficulty of Paying Living Expenses: Not very hard  Food Insecurity: No Food Insecurity (10/16/2022)   Hunger Vital Sign    Worried  About Running Out of Food in the Last Year: Never true    Ran Out of Food in the Last Year: Never true  Transportation Needs: No Transportation Needs (10/16/2022)   PRAPARE - Administrator, Civil Service (Medical): No    Lack of Transportation (Non-Medical): No  Physical Activity: Insufficiently Active (10/16/2022)   Exercise Vital Sign    Days of Exercise per Week: 2 days    Minutes of Exercise per Session: 10 min  Stress: No Stress Concern Present (10/16/2022)   [employer] of Occupational Health - Occupational Stress Questionnaire    Feeling of Stress : Only a little  Social Connections: Socially Integrated (10/16/2022)   Social Connection and Isolation Panel [NHANES]    Frequency of Communication with Friends and Family: More than three times a week    Frequency of Social Gatherings with Friends and Family: Twice a week    Attends Religious Services: More than 4 times per year    Active Member of [employer] or Organizations: Yes    Attends Engineer, structural: More than 4 times per year    Marital Status: Married  Catering manager Violence: Not At Risk (10/16/2022)   Humiliation, Afraid, Rape, and Kick questionnaire    Fear of Current or Ex-Partner: No    Emotionally Abused: No    Physically Abused: No    Sexually Abused: No    Outpatient Medications Prior to Visit  Medication Sig Dispense Refill   albuterol (VENTOLIN HFA) 108 (90 Base) MCG/ACT inhaler Inhale 2 puffs into the lungs every 4 (four) hours as needed for wheezing or shortness of breath. 1 each 3   amLODipine (NORVASC) 5 MG tablet Take 1 tablet (5 mg total) by mouth daily. 90 tablet 3   diclofenac (VOLTAREN) 75 MG EC tablet Take 1 tablet (75 mg total) by mouth 2 (two) times daily. 90 tablet 1   estradiol (ESTRACE VAGINAL) 0.1 MG/GM vaginal cream Pea-sized amount once per week 42.5 g 6   famotidine (PEPCID) 40 MG tablet Take 1 tablet (40 mg total) by mouth daily. 30 tablet 5   fluconazole (DIFLUCAN) 150  MG tablet Take one tablet then repeat in 2 days if needed 1 tablet 0   fluticasone (FLONASE) 50 MCG/ACT nasal spray Place 2 sprays into both nostrils daily. 16 g 0   HYDROcodone-acetaminophen (NORCO/VICODIN) 5-325 MG tablet Take 1 tablet by mouth at bedtime as needed for pain 45 tablet 0   ibandronate (BONIVA) 3 MG/3ML SOLN injection Inject 3 mLs (3 mg total) into the vein every 3 (three) months. 3 mL 3   lisinopril (ZESTRIL) 5 MG tablet Take 1 tablet (5 mg total) by mouth daily. 90 tablet 3   methocarbamol (ROBAXIN) 750 MG  tablet Take 1 tablet (750 mg total) by mouth at bedtime. 30 tablet 2   metroNIDAZOLE (FLAGYL) 500 MG tablet Take 1 tablet (500 mg total) by mouth 2 (two) times daily for 7 days. 14 tablet 0   tolterodine (DETROL LA) 2 MG 24 hr capsule Take 1 capsule (2 mg total) by mouth daily. 30 capsule 11   ofloxacin (OCUFLOX) 0.3 % ophthalmic solution Place 1 drop into both eyes 4 (four) times daily. 5 mL 0   No facility-administered medications prior to visit.    Allergies  Allergen Reactions   Fosamax [Alendronate Sodium] Other (See Comments)    Severe GI Upset   Sulfa Antibiotics Hives    ROS Review of Systems  Constitutional:  Negative for chills and fever.  HENT:  Negative for congestion, sinus pressure, sinus pain and sore throat.   Eyes:  Negative for pain and discharge.  Respiratory:  Negative for cough and shortness of breath.   Cardiovascular:  Negative for chest pain and palpitations.  Gastrointestinal:  Negative for abdominal pain, diarrhea, nausea and vomiting.  Endocrine: Negative for polydipsia and polyuria.  Genitourinary:  Positive for urgency. Negative for dysuria and hematuria.  Musculoskeletal:  Positive for arthralgias and back pain. Negative for neck pain and neck stiffness.  Skin:  Negative for rash.  Neurological:  Negative for dizziness and weakness.  Psychiatric/Behavioral:  Negative for agitation and behavioral problems.       Objective:     Physical Exam Vitals reviewed.  Constitutional:      General: She is not in acute distress.    Appearance: She is obese. She is not diaphoretic.  HENT:     Head: Normocephalic and atraumatic.     Nose: Nose normal.     Mouth/Throat:     Mouth: Mucous membranes are moist.  Eyes:     General: No scleral icterus.    Extraocular Movements: Extraocular movements intact.  Cardiovascular:     Rate and Rhythm: Normal rate and regular rhythm.     Heart sounds: Normal heart sounds. No murmur heard. Pulmonary:     Breath sounds: Normal breath sounds. No wheezing or rales.  Musculoskeletal:     Cervical back: Neck supple. No tenderness.     Right lower leg: No edema.     Left lower leg: No edema.  Skin:    General: Skin is warm.     Findings: No rash.  Neurological:     General: No focal deficit present.     Mental Status: She is alert and oriented to person, place, and time.     Sensory: No sensory deficit.     Motor: No weakness.  Psychiatric:        Mood and Affect: Mood normal.        Behavior: Behavior normal.     BP 111/68   Pulse 85   Ht 5\' 1"  (1.549 m)   Wt 154 lb 12.8 oz (70.2 kg)   SpO2 93%   BMI 29.25 kg/m  Wt Readings from Last 3 Encounters:  08/11/23 154 lb 12.8 oz (70.2 kg)  08/07/23 155 lb 12.8 oz (70.7 kg)  05/21/23 151 lb 12.8 oz (68.9 kg)    Lab Results  Component Value Date   TSH 2.150 02/05/2023   Lab Results  Component Value Date   WBC 7.5 02/05/2023   HGB 12.9 02/05/2023   HCT 37.3 02/05/2023   MCV 94 02/05/2023   PLT 353 02/05/2023   Lab Results  Component Value Date   NA 146 (H) 02/05/2023   K 4.1 02/05/2023   CO2 23 02/05/2023   GLUCOSE 99 02/05/2023   BUN 18 02/05/2023   CREATININE 0.69 02/05/2023   BILITOT <0.2 02/05/2023   ALKPHOS 61 02/05/2023   AST 22 02/05/2023   ALT 26 02/05/2023   PROT 6.4 02/05/2023   ALBUMIN 4.3 02/05/2023   CALCIUM 9.7 02/05/2023   ANIONGAP 5 04/03/2021   EGFR 96 02/05/2023   Lab Results   Component Value Date   CHOL 149 07/24/2022   Lab Results  Component Value Date   HDL 56 07/24/2022   Lab Results  Component Value Date   LDLCALC 76 07/24/2022   Lab Results  Component Value Date   TRIG 94 07/24/2022   Lab Results  Component Value Date   CHOLHDL 2.7 07/24/2022   Lab Results  Component Value Date   HGBA1C 5.6 02/05/2023      Assessment & Plan:   Problem List Items Addressed This Visit    Problem List Items Addressed This Visit       Cardiovascular and Mediastinum   Essential hypertension - Primary   BP Readings from Last 1 Encounters:  08/11/23 111/68   Well-controlled with lisinopril and amlodipine Counseled for compliance with the medications Advised DASH diet and moderate exercise/walking, at least 150 mins/week        Digestive   Gastroesophageal reflux disease without esophagitis   Continue Pepcid only If worsening of acid reflux, advised to take omeprazole as needed        Musculoskeletal and Integument   Osteopenia   With h/o compression fracture of thoracic vertebra Did not tolerate oral bisphosphonate On Boniva currently - since 11/20        Genitourinary   Overactive bladder   Uncontrolled On Detrol 2 mg once daily Also has uterine prolapse, followed by OB/GYN - urinary urgency had also improved with pessary placement, but now removed for pessary-free interval      Uterine prolapse   Followed by OB/GYN Has been advised for surgical repair, she is thinking about it        Other   Prediabetes   Lab Results  Component Value Date   HGBA1C 5.6 02/05/2023   Advised to follow low-carb diet for now         No orders of the defined types were placed in this encounter.   Follow-up: Return in about 6 months (around 02/08/2024) for Annual physical.    Anabel Halon, MD

## 2023-08-11 NOTE — Assessment & Plan Note (Addendum)
Continue Pepcid only If worsening of acid reflux, advised to take omeprazole as needed

## 2023-08-11 NOTE — Assessment & Plan Note (Addendum)
Uncontrolled On Detrol 2 mg once daily Also has uterine prolapse, followed by OB/GYN - urinary urgency had also improved with pessary placement, but now removed for pessary-free interval

## 2023-08-11 NOTE — Assessment & Plan Note (Signed)
Lab Results  Component Value Date   HGBA1C 5.6 02/05/2023   Advised to follow low-carb diet for now

## 2023-08-11 NOTE — Assessment & Plan Note (Signed)
BP Readings from Last 1 Encounters:  08/11/23 111/68   Well-controlled with lisinopril and amlodipine Counseled for compliance with the medications Advised DASH diet and moderate exercise/walking, at least 150 mins/week

## 2023-08-11 NOTE — Assessment & Plan Note (Signed)
With h/o compression fracture of thoracic vertebra Did not tolerate oral bisphosphonate On Boniva currently - since 11/20

## 2023-08-18 DIAGNOSIS — H04123 Dry eye syndrome of bilateral lacrimal glands: Secondary | ICD-10-CM | POA: Diagnosis not present

## 2023-09-08 ENCOUNTER — Other Ambulatory Visit (HOSPITAL_COMMUNITY): Payer: Self-pay

## 2023-09-17 ENCOUNTER — Encounter: Payer: Self-pay | Admitting: Obstetrics & Gynecology

## 2023-09-17 ENCOUNTER — Ambulatory Visit: Payer: Commercial Managed Care - PPO | Admitting: Obstetrics & Gynecology

## 2023-09-17 VITALS — BP 116/66 | HR 90 | Ht 61.0 in | Wt 161.0 lb

## 2023-09-17 DIAGNOSIS — N8110 Cystocele, unspecified: Secondary | ICD-10-CM | POA: Diagnosis not present

## 2023-09-17 DIAGNOSIS — Z4689 Encounter for fitting and adjustment of other specified devices: Secondary | ICD-10-CM | POA: Diagnosis not present

## 2023-09-17 DIAGNOSIS — N3281 Overactive bladder: Secondary | ICD-10-CM

## 2023-09-17 DIAGNOSIS — N952 Postmenopausal atrophic vaginitis: Secondary | ICD-10-CM

## 2023-09-17 DIAGNOSIS — N814 Uterovaginal prolapse, unspecified: Secondary | ICD-10-CM

## 2023-09-17 MED ORDER — ESTRADIOL 0.1 MG/GM VA CREA: TOPICAL_CREAM | VAGINAL | 2 refills | Status: AC

## 2023-09-17 NOTE — Progress Notes (Signed)
     Janice Higgins MRN [id number]  Date of birth: [birth year] Chief Complaint:    Chief Complaint  Patient presents with   Pessary Check   History of Present Illness:   Janice Higgins is a 68 y.o. G1P1 PM2 female being seen today for pessary maintenance.  At her last visit, due to significant granulation tissue plan was for a short "pessary vacation."  She notes that she is ready to have device put back.  Notes that all her old symptoms are back.  Feeling vaginal bulge, incomplete emptying and urinary frequency.  Denies vaginal bleeding or discharge  .     She uses a Electronics engineer She reports no vaginal discharge and no vaginal bleeding    Review of Systems:   Pertinent items are noted in HPI Denies fever/chills, dizziness, headaches, visual disturbances, fatigue, shortness of breath, chest pain, abdominal pain, vomiting Pertinent History Reviewed:  Reviewed past medical,surgical, social, obstetrical and family history.  Reviewed problem list, medications and allergies. Physical Assessment:   Vitals:   09/17/23 1534  BP: 116/66  Pulse: 90  Weight: 161 lb (73 kg)  Height: 5\' 1"  (1.549 m)  Body mass index is 30.42 kg/m.       Physical Examination:   General appearance: alert, well appearing, and in no distress  Psych: mood appropriate, normal affect  Skin: warm & dry   Cardiovascular: normal heart rate noted  Respiratory: normal respiratory effort, no distress  Abdomen: soft, non-tender   GU: normal external genitalia, Stage 3 uterine prolapse noted.  Vaginal tissue appears pink and healthy- significant improvement since prior exam.  Gelhorn placed without difficiutly  The pessary is removed. Vagina: Exam reveals no undue vaginal mucosal pressure of breakdown, no discharge and no vaginal bleeding.  Vaginal Epithelial Abnormality Classification System:   0 0    No abnormalities 1    Epithelial erythema 2    Granulation tissue 3    Epithelial break or  erosion, 1 cm or less 4    Epithelial break or erosion, 1 cm or greater   Irrigation completed- no vaginal bleeding or abnormalities noted   Extremities: no edema   Chaperone: [name]    Assessment & Plan:     ICD-10-CM   1. Pessary maintenance  Z46.89     2. Vaginal atrophy  N95.2 estradiol (ESTRACE VAGINAL) 0.1 MG/GM vaginal cream    3. OAB (overactive bladder)  N32.81     4. Uterine prolapse  N81.4     5. Pelvic organ prolapse quantification stage 3 cystocele  N81.10 Ambulatory referral to Urogynecology      -device placed without complications -f/u in 3 mos -plan to restart estrogen cream weekly -also reconsidering surgical intervention- referral created   Meds ordered this encounter  Medications   estradiol (ESTRACE VAGINAL) 0.1 MG/GM vaginal cream    Sig: Pea-sized amount once per week    Dispense:  42.5 g    Refill:  2      Return in about 3 months (around 12/18/2023) for pessary maintenance with Dr. [name].   Janice Hidalgo, DO Attending Obstetrician & Gynecologist, [facility] for [employer], [facility]

## 2023-09-23 ENCOUNTER — Other Ambulatory Visit: Payer: Self-pay

## 2023-09-25 ENCOUNTER — Other Ambulatory Visit: Payer: Self-pay

## 2023-10-06 ENCOUNTER — Other Ambulatory Visit: Payer: Self-pay

## 2023-10-06 ENCOUNTER — Other Ambulatory Visit (HOSPITAL_COMMUNITY): Payer: Self-pay

## 2023-10-06 ENCOUNTER — Other Ambulatory Visit: Payer: Self-pay | Admitting: Internal Medicine

## 2023-10-06 DIAGNOSIS — M545 Low back pain, unspecified: Secondary | ICD-10-CM

## 2023-10-06 MED ORDER — DICLOFENAC SODIUM 75 MG PO TBEC
75.0000 mg | DELAYED_RELEASE_TABLET | Freq: Two times a day (BID) | ORAL | 1 refills | Status: DC
Start: 2023-10-06 — End: 2024-03-08
  Filled 2023-10-06: qty 90, 45d supply, fill #0
  Filled 2023-11-18: qty 90, 45d supply, fill #1

## 2023-10-15 ENCOUNTER — Encounter: Payer: 59 | Attending: Internal Medicine | Admitting: *Deleted

## 2023-10-15 VITALS — BP 127/80 | HR 89 | Temp 98.0°F | Resp 16

## 2023-10-15 DIAGNOSIS — M8580 Other specified disorders of bone density and structure, unspecified site: Secondary | ICD-10-CM | POA: Insufficient documentation

## 2023-10-15 MED ORDER — IBANDRONATE SODIUM 3 MG/3ML IV SOLN
3.0000 mg | Freq: Once | INTRAVENOUS | Status: AC
  Administered 2023-10-15: 3 mg via INTRAVENOUS

## 2023-10-15 MED ORDER — DIPHENHYDRAMINE HCL 25 MG PO CAPS
25.0000 mg | ORAL_CAPSULE | Freq: Once | ORAL | Status: DC
Start: 2023-10-15 — End: 2023-10-15

## 2023-10-15 MED ORDER — ACETAMINOPHEN 325 MG PO TABS
650.0000 mg | ORAL_TABLET | Freq: Once | ORAL | Status: DC
Start: 2023-10-15 — End: 2023-10-15

## 2023-10-15 NOTE — Progress Notes (Signed)
 Diagnosis: Osteoporosis  Provider:  Trena Platt MD  Procedure: IV Infusion  IV Type: Peripheral, IV Location: L Antecubital  Boniva (Ibandronate Sodium), Dose: 3 mg  Infusion Start Time: 1618  Infusion Stop Time: 1619  Post Infusion IV Care: Observation period completed  Discharge: Condition: Good, Destination: Home . AVS Provided  Performed by:  Daleen Squibb, RN

## 2023-10-24 ENCOUNTER — Other Ambulatory Visit (HOSPITAL_COMMUNITY): Payer: Self-pay

## 2023-10-27 ENCOUNTER — Other Ambulatory Visit (HOSPITAL_COMMUNITY): Payer: Self-pay

## 2023-10-27 MED ORDER — CYCLOSPORINE 0.05 % OP EMUL
1.0000 [drp] | Freq: Two times a day (BID) | OPHTHALMIC | 6 refills | Status: AC
  Filled 2023-10-27: qty 60, 30d supply, fill #0
  Filled 2023-12-05: qty 60, 30d supply, fill #1
  Filled 2024-02-05: qty 60, 30d supply, fill #2
  Filled 2024-04-06: qty 60, 30d supply, fill #0
  Filled 2024-05-17: qty 60, 30d supply, fill #1
  Filled 2024-07-12: qty 60, 30d supply, fill #2

## 2023-10-28 ENCOUNTER — Other Ambulatory Visit (HOSPITAL_COMMUNITY): Payer: Self-pay

## 2023-11-03 ENCOUNTER — Other Ambulatory Visit (HOSPITAL_COMMUNITY): Payer: Self-pay

## 2023-11-03 DIAGNOSIS — H01001 Unspecified blepharitis right upper eyelid: Secondary | ICD-10-CM | POA: Diagnosis not present

## 2023-11-03 DIAGNOSIS — H2513 Age-related nuclear cataract, bilateral: Secondary | ICD-10-CM | POA: Diagnosis not present

## 2023-11-03 DIAGNOSIS — H01004 Unspecified blepharitis left upper eyelid: Secondary | ICD-10-CM | POA: Diagnosis not present

## 2023-11-03 DIAGNOSIS — H01002 Unspecified blepharitis right lower eyelid: Secondary | ICD-10-CM | POA: Diagnosis not present

## 2023-11-11 ENCOUNTER — Other Ambulatory Visit: Payer: Self-pay | Admitting: *Deleted

## 2023-11-11 ENCOUNTER — Other Ambulatory Visit (HOSPITAL_COMMUNITY): Payer: Self-pay

## 2023-11-11 ENCOUNTER — Other Ambulatory Visit: Payer: Self-pay

## 2023-11-11 ENCOUNTER — Telehealth: Payer: Self-pay

## 2023-11-11 NOTE — Telephone Encounter (Signed)
Refill request sent to Dr. Ozan.  

## 2023-11-11 NOTE — Telephone Encounter (Signed)
 Patient called and stated that she needs a refill on her tolterodine  (DETROL  LA) 2 MG.

## 2023-11-12 ENCOUNTER — Other Ambulatory Visit: Payer: Self-pay | Admitting: Obstetrics & Gynecology

## 2023-11-12 DIAGNOSIS — N3281 Overactive bladder: Secondary | ICD-10-CM

## 2023-11-12 MED ORDER — TOLTERODINE TARTRATE ER 2 MG PO CP24
2.0000 mg | ORAL_CAPSULE | Freq: Every day | ORAL | 4 refills | Status: AC
  Filled 2023-11-15: qty 90, 90d supply, fill #0
  Filled 2024-02-15: qty 90, 90d supply, fill #1
  Filled 2024-05-17: qty 60, 60d supply, fill #0
  Filled 2024-05-19: qty 30, 30d supply, fill #0
  Filled 2024-08-11: qty 90, 90d supply, fill #1

## 2023-11-12 NOTE — Progress Notes (Signed)
 Refill Detrol   Floris Neuhaus, DO Attending Obstetrician & Gynecologist, [facility] for [employer], [facility]

## 2023-11-14 ENCOUNTER — Other Ambulatory Visit (HOSPITAL_COMMUNITY): Payer: Self-pay

## 2023-11-15 ENCOUNTER — Other Ambulatory Visit (HOSPITAL_COMMUNITY): Payer: Self-pay

## 2023-11-17 ENCOUNTER — Other Ambulatory Visit: Payer: Self-pay

## 2023-11-18 ENCOUNTER — Other Ambulatory Visit (HOSPITAL_COMMUNITY): Payer: Self-pay

## 2023-11-28 ENCOUNTER — Telehealth (INDEPENDENT_AMBULATORY_CARE_PROVIDER_SITE_OTHER): Payer: Self-pay | Admitting: Internal Medicine

## 2023-11-28 ENCOUNTER — Encounter: Payer: Self-pay | Admitting: Internal Medicine

## 2023-11-28 VITALS — Ht 61.5 in | Wt 160.0 lb

## 2023-11-28 DIAGNOSIS — J209 Acute bronchitis, unspecified: Secondary | ICD-10-CM | POA: Diagnosis not present

## 2023-11-28 MED ORDER — METHYLPREDNISOLONE 4 MG PO TBPK: ORAL_TABLET | ORAL | 0 refills | Status: DC

## 2023-11-28 NOTE — Progress Notes (Signed)
 Virtual Visit via Video Note   Because of Janice Higgins's co-morbid illnesses, she is at least at moderate risk for complications without adequate follow up.  This format is felt to be most appropriate for this patient at this time.  All issues noted in this document were discussed and addressed.  A limited physical exam was performed with this format.      Evaluation Performed:  Follow-up visit  Date:  11/28/2023   ID:  Janice Higgins, DOB 12/09/1955, MRN [id number]  Patient Location: Home Provider Location: Office/Clinic  Participants: Patient Location of Patient: Home Location of Provider: Telehealth Consent was obtain for visit to be over via telehealth. I verified that I am speaking with the correct person using two identifiers.  PCP:  Meldon Sport, MD   Chief Complaint: Cough, dyspnea and chest congestion  History of Present Illness:    Janice Higgins is a 68 y.o. female who has a video visit for complaint of cough, postnasal drip, chest congestion and dyspnea with exertion.  She denies any started having nasal congestion and cough about 3 weeks ago.  She has tried taking DayQuil/NyQuil and other OTC antihistaminic without much relief.  She has also been using albuterol  inhaler for dyspnea with adequate relief.  Denies any fever or chills.  The patient does not have symptoms concerning for COVID-19 infection (fever, chills, cough, or new shortness of breath).   Past Medical, Surgical, Social History, Allergies, and Medications have been Reviewed.  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   Family history of breast cancer    History of breast cancer 04/25/2015   History of bronchitis    History of kidney stones    Hypertension    Osteoarthritis    hands and feet   Osteoporosis    PMB (postmenopausal bleeding) 04/25/2015   Radiation 10/13/14-11/04/14   Left breast   Past Surgical  History:  Procedure Laterality Date   BREAST LUMPECTOMY WITH RADIOACTIVE SEED LOCALIZATION Left 08/12/2014   Procedure:  RADIOACTIVE SEED LOCALIZATION LEFT BREAST LUMPECTOMY;  Surgeon: Ayesha Lente, MD;  Location: [facility];  Service: General;  Laterality: Left;   COLONOSCOPY N/A 05/04/2015   Procedure: COLONOSCOPY;  Surgeon: Ruby Corporal, MD;  [facility];  Service: Endoscopy;  Laterality: N/A;  7:30   FOOT SURGERY Right 1995   exc. bone spur   LASIK     RE-EXCISION OF BREAST LUMPECTOMY Left 08/22/2014   Procedure: RE-EXCISION OF LEFT BREAST LUMPECTOMY;  Surgeon: Ayesha Lente, MD;  Location: [facility];  Service: General;  Laterality: Left;   RE-EXCISION OF BREAST LUMPECTOMY Left 09/05/2014   Procedure: RE-EXCISION OF BREAST LUMPECTOMY;  Surgeon: Ayesha Lente, MD;  Location: [facility];  Service: General;  Laterality: Left;   TONSILLECTOMY     TUBAL LIGATION       Current Meds  Medication Sig   albuterol  (VENTOLIN  HFA) 108 (90 Base) MCG/ACT inhaler Inhale 2 puffs into the lungs every 4 (four) hours as needed for wheezing or shortness of breath.   amLODipine  (NORVASC ) 5 MG tablet Take 1 tablet (5 mg total) by mouth daily.   cycloSPORINE  (RESTASIS ) 0.05 % ophthalmic emulsion Place 1 drop into both eyes 2 (two) times daily.   diclofenac  (VOLTAREN ) 75 MG EC tablet Take 1 tablet (75 mg total) by mouth 2 (two) times  daily.   estradiol  (ESTRACE  VAGINAL) 0.1 MG/GM vaginal cream Pea-sized amount once per week   famotidine  (PEPCID ) 40 MG tablet Take 1 tablet (40 mg total) by mouth daily.   fluconazole  (DIFLUCAN ) 150 MG tablet Take one tablet then repeat in 2 days if needed   fluticasone  (FLONASE ) 50 MCG/ACT nasal spray Place 2 sprays into both nostrils daily.   HYDROcodone -acetaminophen  (NORCO/VICODIN) 5-325 MG tablet Take 1 tablet by mouth at bedtime as needed for pain   ibandronate  (BONIVA ) 3 MG/3ML SOLN injection Inject 3 mLs (3 mg total) into  the vein every 3 (three) months.   lisinopril  (ZESTRIL ) 5 MG tablet Take 1 tablet (5 mg total) by mouth daily.   methocarbamol  (ROBAXIN ) 750 MG tablet Take 1 tablet (750 mg total) by mouth at bedtime.   tolterodine  (DETROL  LA) 2 MG 24 hr capsule Take 1 capsule (2 mg total) by mouth daily.     Allergies:   Fosamax  [alendronate  sodium] and Sulfa antibiotics   ROS:   Please see the history of present illness. All other systems reviewed and are negative.   Labs/Other Tests and Data Reviewed:    Recent Labs: 02/05/2023: ALT 26; BUN 18; Creatinine, Ser 0.69; Hemoglobin 12.9; Platelets 353; Potassium 4.1; Sodium 146; TSH 2.150   Recent Lipid Panel Lab Results  Component Value Date/Time   CHOL 149 07/24/2022 02:19 PM   TRIG 94 07/24/2022 02:19 PM   HDL 56 07/24/2022 02:19 PM   CHOLHDL 2.7 07/24/2022 02:19 PM   CHOLHDL 2.5 12/14/2018 08:24 AM   LDLCALC 76 07/24/2022 02:19 PM    Wt Readings from Last 3 Encounters:  11/28/23 160 lb (72.6 kg)  09/17/23 161 lb (73 kg)  08/11/23 154 lb 12.8 oz (70.2 kg)     Objective:    Vital Signs:  Ht 5' 1.5" (1.562 m)   Wt 160 lb (72.6 kg)   BMI 29.74 kg/m    VITAL SIGNS:  reviewed GEN:  no acute distress EYES:  sclerae anicteric, EOMI - Extraocular Movements Intact RESPIRATORY:  normal respiratory effort, symmetric expansion NEURO:  alert and oriented x 3, no obvious focal deficit PSYCH:  normal affect  ASSESSMENT & PLAN:    Acute bronchitis Likely started with an allergic rhinosinusitis Now has cough with clear expectoration and chest congestion/tightness Started Medrol  Dosepak Mucinex  or Robitussin as needed for cough If persistent symptoms, will start empiric antibiotic Continue Flonase  for nasal congestion Warm water  gargling for sore throat    I discussed the assessment and treatment plan with the patient. The patient was provided an opportunity to ask questions, and all were answered. The patient agreed with the plan and  demonstrated an understanding of the instructions.   The patient was advised to call back or seek an in-person evaluation if the symptoms worsen or if the condition fails to improve as anticipated.  The above assessment and management plan was discussed with the patient. The patient verbalized understanding of and has agreed to the management plan.   Medication Adjustments/Labs and Tests Ordered: Current medicines are reviewed at length with the patient today.  Concerns regarding medicines are outlined above.   Tests Ordered: No orders of the defined types were placed in this encounter.   Medication Changes: No orders of the defined types were placed in this encounter.    Note: This dictation was prepared with Dragon dictation along with smaller phrase technology. Similar sounding words can be transcribed inadequately or may not be corrected upon review. Any transcriptional errors  that result from this process are unintentional.      Disposition:  Follow up  Signed, Meldon Sport, MD  11/28/2023 8:44 AM     [facility]

## 2023-12-05 ENCOUNTER — Other Ambulatory Visit (HOSPITAL_COMMUNITY): Payer: Self-pay

## 2023-12-10 ENCOUNTER — Ambulatory Visit: Admitting: Adult Health

## 2023-12-10 ENCOUNTER — Encounter: Payer: Self-pay | Admitting: Adult Health

## 2023-12-10 VITALS — BP 131/80 | HR 89 | Ht 61.5 in | Wt 171.0 lb

## 2023-12-10 DIAGNOSIS — R309 Painful micturition, unspecified: Secondary | ICD-10-CM

## 2023-12-10 DIAGNOSIS — N8110 Cystocele, unspecified: Secondary | ICD-10-CM

## 2023-12-10 DIAGNOSIS — N814 Uterovaginal prolapse, unspecified: Secondary | ICD-10-CM

## 2023-12-10 DIAGNOSIS — R300 Dysuria: Secondary | ICD-10-CM

## 2023-12-10 DIAGNOSIS — N952 Postmenopausal atrophic vaginitis: Secondary | ICD-10-CM | POA: Insufficient documentation

## 2023-12-10 DIAGNOSIS — R350 Frequency of micturition: Secondary | ICD-10-CM | POA: Diagnosis not present

## 2023-12-10 LAB — POCT URINALYSIS DIPSTICK
Glucose, UA: NEGATIVE
Ketones, UA: NEGATIVE
Nitrite, UA: POSITIVE
Protein, UA: POSITIVE — AB

## 2023-12-10 MED ORDER — NITROFURANTOIN MONOHYD MACRO 100 MG PO CAPS: 100.0000 mg | ORAL_CAPSULE | Freq: Two times a day (BID) | ORAL | 0 refills | Status: DC

## 2023-12-10 NOTE — Progress Notes (Signed)
  Subjective:     Patient ID: Janice Higgins, female   DOB: Jun 19, 1956, 68 y.o.   MRN: [id number]  HPI Janice Higgins is a 68 year old white female, married, PM, in complaining of urinary frequency, with pain and burning, and pessary fell out last week.   PCP is Dr Janice Higgins Review of Systems +urinary frequency and pain and burning Pessary feel out, feels irritated Reviewed past medical,surgical, social and family history. Reviewed medications and allergies.     Objective:   Physical Exam BP 131/80 (BP Location: Left Arm, Patient Position: Sitting, Cuff Size: Normal)   Pulse 89   Ht 5' 1.5" (1.562 m)   Wt 171 lb (77.6 kg)   BMI 31.79 kg/m  Urine dipstick, + nitrates,protein and leuks and blood Skin warm and dry.Pelvic: external genitalia is normal in appearance no lesions, vagina:pale, +cystocele and prolapse,urethra has no lesions or masses noted, cervix:smooth, uterus: normal size, shape and contour, non tender, no masses felt, adnexa: no masses or tenderness noted. Bladder is non tender and no masses felt     Upstream - 12/10/23 1540       Pregnancy Intention Screening   Does the patient want to become pregnant in the next year? No    Does the patient's partner want to become pregnant in the next year? No    Would the patient like to discuss contraceptive options today? No      Contraception Wrap Up   Current Method Female Sterilization    End Method Female Sterilization    Contraception Counseling Provided No            Examination chaperoned by Janice Aschoff LPN  Assessment:     1. Pain with urination (Primary) +pain with urination  Will rx Macrobid Meds ordered this encounter  Medications   nitrofurantoin, macrocrystal-monohydrate, (MACROBID) 100 MG capsule    Sig: Take 1 capsule (100 mg total) by mouth 2 (two) times daily.    Dispense:  14 capsule    Refill:  0    Supervising Provider:   Evalyn Higgins H [[id number]]   UA C&S sent Push water   - POCT Urinalysis Dipstick - Urine  Culture - Urinalysis, Routine w reflex microscopic  2. Burning with urination UA C&S sent  - POCT Urinalysis Dipstick - Urine Culture - Urinalysis, Routine w reflex microscopic  3. Urinary frequency - POCT Urinalysis Dipstick - Urine Culture - Urinalysis, Routine w reflex microscopic  4. Vaginal atrophy Use estrace  vaginal cream 2 x before next appt  5. Pelvic organ prolapse quantification stage 3 cystocele  6. Uterine prolapse Pessary fell out last week, will leave out til Monday     Plan:     Follow up in 5 days to reinsert pessary

## 2023-12-11 DIAGNOSIS — R300 Dysuria: Secondary | ICD-10-CM | POA: Diagnosis not present

## 2023-12-11 DIAGNOSIS — R309 Painful micturition, unspecified: Secondary | ICD-10-CM | POA: Diagnosis not present

## 2023-12-11 DIAGNOSIS — R350 Frequency of micturition: Secondary | ICD-10-CM | POA: Diagnosis not present

## 2023-12-12 ENCOUNTER — Ambulatory Visit: Payer: Self-pay | Admitting: Adult Health

## 2023-12-12 LAB — URINALYSIS, ROUTINE W REFLEX MICROSCOPIC
Bilirubin, UA: NEGATIVE
Glucose, UA: NEGATIVE
Nitrite, UA: POSITIVE — AB
Specific Gravity, UA: 1.023 (ref 1.005–1.030)
Urobilinogen, Ur: 1 mg/dL (ref 0.2–1.0)
pH, UA: 6.5 (ref 5.0–7.5)

## 2023-12-12 LAB — MICROSCOPIC EXAMINATION
Casts: NONE SEEN /LPF
WBC, UA: >30 /HPF — AB (ref 0–5)

## 2023-12-15 ENCOUNTER — Encounter: Payer: Self-pay | Admitting: Adult Health

## 2023-12-15 ENCOUNTER — Ambulatory Visit: Admitting: Adult Health

## 2023-12-15 VITALS — BP 115/74 | HR 94 | Ht 61.5 in | Wt 171.0 lb

## 2023-12-15 DIAGNOSIS — Z960 Presence of urogenital implants: Secondary | ICD-10-CM

## 2023-12-15 DIAGNOSIS — Z4689 Encounter for fitting and adjustment of other specified devices: Secondary | ICD-10-CM | POA: Insufficient documentation

## 2023-12-15 DIAGNOSIS — N952 Postmenopausal atrophic vaginitis: Secondary | ICD-10-CM | POA: Diagnosis not present

## 2023-12-15 LAB — URINE CULTURE

## 2023-12-15 NOTE — Progress Notes (Signed)
  Subjective:     Patient ID: Janice Higgins, female   DOB: 18-Mar-1956, 68 y.o.   MRN: [id number]  HPI [name] is a 68 year old white female,married PM back in follow up on having pessary replaced after leaving out and using vaginal estrogen x 2. She has few more days on taking macrobid  for UTI.  PCP is Dr Lydia Sams  Review of Systems For pessary reinsertion Reviewed past medical,surgical, social and family history. Reviewed medications and allergies.     Objective:   Physical Exam BP 115/74 (BP Location: Left Arm, Patient Position: Sitting, Cuff Size: Normal)   Pulse 94   Ht 5' 1.5" (1.562 m)   Wt 171 lb (77.6 kg)   BMI 31.79 kg/m     Skin warm and dry.Pelvic: external genitalia is normal in appearance no lesions, vagina: pale, looks more moist, Gellhorn pessary easily reinserted,urethra has no lesions or masses noted, cervix:smooth and bulbous, uterus: normal size, shape and contour, non tender, no masses felt, adnexa: no masses or tenderness noted. Bladder is non tender and no masses felt.  Upstream - 12/15/23 1616       Pregnancy Intention Screening   Does the patient want to become pregnant in the next year? N/A    Does the patient's partner want to become pregnant in the next year? N/A    Would the patient like to discuss contraceptive options today? N/A      Contraception Wrap Up   Current Method Female Sterilization    End Method Female Sterilization    Contraception Counseling Provided No            Examination chaperoned by Alphonso Aschoff LPN  Assessment:     1. Pessary maintenance (Primary) Pessary reinserted Follow up in 3 months if maintenance needed, has appt end of June with Dr Aron Lard   2. Vaginal atrophy Use estrace  vaginal cream once a week, pea sized amount on finger will do    Plan:     Follow up in 3 months for pessary maintenance

## 2024-01-12 ENCOUNTER — Ambulatory Visit: Admitting: Obstetrics

## 2024-01-12 ENCOUNTER — Other Ambulatory Visit (HOSPITAL_COMMUNITY): Payer: Self-pay

## 2024-01-12 ENCOUNTER — Other Ambulatory Visit: Payer: Self-pay

## 2024-01-12 ENCOUNTER — Other Ambulatory Visit (HOSPITAL_BASED_OUTPATIENT_CLINIC_OR_DEPARTMENT_OTHER): Payer: Self-pay

## 2024-01-14 ENCOUNTER — Encounter: Attending: Internal Medicine | Admitting: *Deleted

## 2024-01-14 VITALS — BP 118/73 | HR 98 | Temp 98.0°F | Resp 16

## 2024-01-14 DIAGNOSIS — M8580 Other specified disorders of bone density and structure, unspecified site: Secondary | ICD-10-CM | POA: Diagnosis not present

## 2024-01-14 MED ORDER — IBANDRONATE SODIUM 3 MG/3ML IV SOLN
3.0000 mg | Freq: Once | INTRAVENOUS | Status: AC
  Administered 2024-01-14: 3 mg via INTRAVENOUS

## 2024-01-14 NOTE — Progress Notes (Signed)
 Diagnosis: Osteopenia  Provider:  Tobie Downs MD  Procedure: IV Push  IV Type: Peripheral, IV Location: R Forearm  Boniva  (Ibandronate  Sodium), Dose: 3 mg  Post Infusion IV Care: Observation period completed  Discharge: Condition: Good, Destination: Home . AVS Provided  Performed by:  Baldwin Darice Helling, RN

## 2024-01-20 NOTE — H&P (Signed)
 Surgical History & Physical  Patient Name: Janice Higgins  DOB: [birth year]  Surgery: Cataract extraction with intraocular lens implant phacoemulsification; Left Eye Surgeon: Lynwood Hermann MD Surgery Date: 01/30/2024 Pre-Op Date: 11/03/2023  HPI: A 13 Yr. old female patient 1. The patient is here for cataract eval. Ref: Dr. Nicholaus. Pt. complains of difficulty when recognizing people. Both eyes are affected. The episode is constant. Symptoms occur when the patient is driving and reading. The complaint is associated with blurry vision. This is negatively affecting the patients quality of life and the patient is unable to function adequately in life with the current level of vision. HPI was performed by Lynwood Hermann .  Medical History: Dry Eyes Cataracts Keratitis sicca  Acid reflux Arthritis Cancer High Blood Pressure  Review of Systems Negative Allergic/Immunologic Negative Cardiovascular Negative Constitutional Negative Endocrine Negative Eyes Negative Gastrointestinal Negative Genitourinary Negative Hematologic/Lymphatic Negative Integumentary Negative Musculoskeletal Negative Neurological Negative Psychiatry Negative Respiratory  Social Never Smoked  Medication Prednisolone-moxiflox-bromfen,  Blood pressure, Arthritis Pain Relief (acetam), Acid Reducer (omeprazole), Beneva injections  Sx/Procedures Lasik,  Breast Cancer surgery  Drug Allergies  Fosamax , Sulfa   History & Physical: Heent: cataracts NECK: supple without bruits LUNGS: lungs clear to auscultation CV: regular rate and rhythm Abdomen: soft and non-tender  Impression & Plan: Assessment: 1.  NUCLEAR SCLEROSIS AGE RELATED; Both Eyes (H25.13) 2.  BLEPHARITIS; Right Upper Lid, Right Lower Lid, Left Upper Lid, Left Lower Lid (H01.001, H01.002,H01.004,H01.005) 3.  DERMATOCHALASIS, no surgery; Right Upper Lid, Left Upper Lid (H02.831, H02.834) 4.  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   Cataract accounts for the patient's decreased vision. This visual impairment is not correctable with a tolerable change in glasses or contact lenses. Cataract surgery with an implantation of a new lens should significantly improve the visual and functional status of the patient. Discussed all risks, benefits, alternatives, and potential complications. Discussed the procedures and recovery. Patient desires to have surgery. A-scan ordered and performed today for intra-ocular lens calculations. The surgery will be performed in order to improve vision for driving, reading, and for eye examinations. Recommend phacoemulsification with intra-ocular lens. Recommend Dextenza  for post-operative pain and inflammation. Left Eye. worse - first. Patient has monovision, wished to keep. Set goal for -2.75 left eye. Dilates poorly - shugarcaine by protocol. Vision Blue. Malyugin Ring. Omidira. S/P Laser Vision Correction - Myopic LASIK - Barrett True K.  2.  Blepharitis is present - recommend regular lid cleaning.  3.  Asymptomatic, recommend observation for now. Findings, prognosis and treatment options reviewed.  4.  Asymptomatic.

## 2024-01-22 DIAGNOSIS — H2512 Age-related nuclear cataract, left eye: Secondary | ICD-10-CM | POA: Diagnosis not present

## 2024-01-28 ENCOUNTER — Encounter (HOSPITAL_COMMUNITY)
Admission: RE | Admit: 2024-01-28 | Discharge: 2024-01-28 | Disposition: A | Discharge status: Home / Self Care | Source: Ambulatory Visit | Attending: Ophthalmology | Admitting: Ophthalmology

## 2024-01-28 ENCOUNTER — Encounter (HOSPITAL_COMMUNITY): Payer: Self-pay

## 2024-01-28 ENCOUNTER — Other Ambulatory Visit: Payer: Self-pay

## 2024-01-30 ENCOUNTER — Ambulatory Visit (HOSPITAL_COMMUNITY): Admitting: Anesthesiology

## 2024-01-30 ENCOUNTER — Encounter (HOSPITAL_COMMUNITY)
Admission: RE | Disposition: A | Discharge status: Home / Self Care | Payer: Self-pay | Source: Home / Self Care | Attending: Ophthalmology

## 2024-01-30 ENCOUNTER — Other Ambulatory Visit: Payer: Self-pay

## 2024-01-30 ENCOUNTER — Encounter (HOSPITAL_COMMUNITY): Payer: Self-pay | Admitting: Ophthalmology

## 2024-01-30 ENCOUNTER — Ambulatory Visit (HOSPITAL_COMMUNITY)
Admission: RE | Admit: 2024-01-30 | Discharge: 2024-01-30 | Disposition: A | Discharge status: Home / Self Care | Attending: Ophthalmology | Admitting: Ophthalmology

## 2024-01-30 DIAGNOSIS — H02831 Dermatochalasis of right upper eyelid: Secondary | ICD-10-CM | POA: Insufficient documentation

## 2024-01-30 DIAGNOSIS — H2512 Age-related nuclear cataract, left eye: Secondary | ICD-10-CM

## 2024-01-30 DIAGNOSIS — H02834 Dermatochalasis of left upper eyelid: Secondary | ICD-10-CM | POA: Insufficient documentation

## 2024-01-30 DIAGNOSIS — H2513 Age-related nuclear cataract, bilateral: Secondary | ICD-10-CM | POA: Insufficient documentation

## 2024-01-30 DIAGNOSIS — M1990 Unspecified osteoarthritis, unspecified site: Secondary | ICD-10-CM | POA: Diagnosis not present

## 2024-01-30 DIAGNOSIS — I10 Essential (primary) hypertension: Secondary | ICD-10-CM | POA: Diagnosis not present

## 2024-01-30 DIAGNOSIS — K219 Gastro-esophageal reflux disease without esophagitis: Secondary | ICD-10-CM | POA: Insufficient documentation

## 2024-01-30 DIAGNOSIS — H11823 Conjunctivochalasis, bilateral: Secondary | ICD-10-CM | POA: Diagnosis not present

## 2024-01-30 DIAGNOSIS — H0100B Unspecified blepharitis left eye, upper and lower eyelids: Secondary | ICD-10-CM | POA: Insufficient documentation

## 2024-01-30 DIAGNOSIS — H0100A Unspecified blepharitis right eye, upper and lower eyelids: Secondary | ICD-10-CM | POA: Diagnosis not present

## 2024-01-30 HISTORY — PX: CATARACT EXTRACTION W/PHACO: SHX586

## 2024-01-30 SURGERY — PHACOEMULSIFICATION, CATARACT, WITH IOL INSERTION
Anesthesia: Monitor Anesthesia Care | Site: Eye | Laterality: Left

## 2024-01-30 MED ORDER — TETRACAINE HCL 0.5 % OP SOLN
1.0000 [drp] | OPHTHALMIC | Status: AC | PRN
  Administered 2024-01-30 (×3): 1 [drp] via OPHTHALMIC

## 2024-01-30 MED ORDER — STERILE WATER FOR IRRIGATION IR SOLN
Status: DC | PRN
Start: 2024-01-30 — End: 2024-01-30
  Administered 2024-01-30: 1

## 2024-01-30 MED ORDER — LACTATED RINGERS IV SOLN: INTRAVENOUS | Status: DC

## 2024-01-30 MED ORDER — MIDAZOLAM HCL 2 MG/2ML IJ SOLN
INTRAMUSCULAR | Status: AC
  Filled 2024-01-30: qty 2

## 2024-01-30 MED ORDER — SODIUM HYALURONATE 10 MG/ML IO SOLUTION
PREFILLED_SYRINGE | INTRAOCULAR | Status: DC | PRN
  Administered 2024-01-30: .85 mL via INTRAOCULAR

## 2024-01-30 MED ORDER — BSS IO SOLN
INTRAOCULAR | Status: DC | PRN
Start: 2024-01-30 — End: 2024-01-30
  Administered 2024-01-30: 15 mL via INTRAOCULAR

## 2024-01-30 MED ORDER — MIDAZOLAM HCL 2 MG/2ML IJ SOLN
INTRAMUSCULAR | Status: DC | PRN
  Administered 2024-01-30: 2 mg via INTRAVENOUS

## 2024-01-30 MED ORDER — TROPICAMIDE 1 % OP SOLN
1.0000 [drp] | OPHTHALMIC | Status: AC | PRN
  Administered 2024-01-30 (×3): 1 [drp] via OPHTHALMIC

## 2024-01-30 MED ORDER — PHENYLEPHRINE-KETOROLAC 1-0.3 % IO SOLN
INTRAOCULAR | Status: DC | PRN
  Administered 2024-01-30: 500 mL via OPHTHALMIC

## 2024-01-30 MED ORDER — MOXIFLOXACIN HCL 5 MG/ML IO SOLN
INTRAOCULAR | Status: DC | PRN
  Administered 2024-01-30: .3 mL via INTRACAMERAL

## 2024-01-30 MED ORDER — POVIDONE-IODINE 5 % OP SOLN
OPHTHALMIC | Status: DC | PRN
  Administered 2024-01-30: 1 via OPHTHALMIC

## 2024-01-30 MED ORDER — SODIUM CHLORIDE 0.9% FLUSH
INTRAVENOUS | Status: DC | PRN
  Administered 2024-01-30: 10 mL via INTRAVENOUS

## 2024-01-30 MED ORDER — PHENYLEPHRINE HCL 2.5 % OP SOLN
1.0000 [drp] | OPHTHALMIC | Status: AC | PRN
  Administered 2024-01-30 (×3): 1 [drp] via OPHTHALMIC

## 2024-01-30 MED ORDER — LIDOCAINE HCL (PF) 1 % IJ SOLN
INTRAMUSCULAR | Status: DC | PRN
  Administered 2024-01-30: 1 mL

## 2024-01-30 MED ORDER — LIDOCAINE HCL 3.5 % OP GEL
1.0000 | Freq: Once | OPHTHALMIC | Status: AC
  Administered 2024-01-30: 1 via OPHTHALMIC

## 2024-01-30 MED ORDER — SODIUM HYALURONATE 23MG/ML IO SOSY
PREFILLED_SYRINGE | INTRAOCULAR | Status: DC | PRN
  Administered 2024-01-30: .6 mL via INTRAOCULAR

## 2024-01-30 SURGICAL SUPPLY — 12 items
CATARACT SUITE SIGHTPATH (MISCELLANEOUS) ×1 IMPLANT
CLOTH BEACON ORANGE TIMEOUT ST (SAFETY) ×1 IMPLANT
EYE SHIELD UNIVERSAL CLEAR (GAUZE/BANDAGES/DRESSINGS) IMPLANT
FEE CATARACT SUITE SIGHTPATH (MISCELLANEOUS) ×1 IMPLANT
GLOVE BIOGEL PI IND STRL 7.0 (GLOVE) ×2 IMPLANT
LENS IOL TECNIS EYHANCE 25.0 (Intraocular Lens) IMPLANT
NDL HYPO 18GX1.5 BLUNT FILL (NEEDLE) ×1 IMPLANT
NEEDLE HYPO 18GX1.5 BLUNT FILL (NEEDLE) ×1 IMPLANT
PAD ARMBOARD POSITIONER FOAM (MISCELLANEOUS) ×1 IMPLANT
SYR TB 1ML LL NO SAFETY (SYRINGE) ×1 IMPLANT
TAPE SURG TRANSPORE 1 IN (GAUZE/BANDAGES/DRESSINGS) IMPLANT
WATER STERILE IRR 250ML POUR (IV SOLUTION) ×1 IMPLANT

## 2024-01-30 NOTE — Discharge Instructions (Addendum)
 Please discharge patient when stable, will follow up today with Dr. June Leap at the [facility] office immediately following discharge.  Leave shield in place until visit.  All paperwork with discharge instructions will be given at the office.  [facility] Address:  [address]  [place], [place] [address]

## 2024-01-30 NOTE — Op Note (Signed)
 Date of procedure: 01/30/24  Pre-operative diagnosis: Visually significant age-related nuclear cataract, Left Eye (H25.12)  Post-operative diagnosis: Visually significant age-related nuclear cataract, Left Eye  Procedure: Removal of cataract via phacoemulsification and insertion of intra-ocular lens Johnson and Johnson DIB00 +25.0D into the capsular bag of the Left Eye  Attending surgeon: Lynwood LABOR. Donn Wilmot, MD, MA  Anesthesia: MAC, Topical Akten  Complications: None  Estimated Blood Loss: <76mL (minimal)  Specimens: None  Implants: As above  Indications:  Visually significant age-related cataract, Left Eye  Procedure:  The patient was seen and identified in the pre-operative area. The operative eye was identified and dilated.  The operative eye was marked.  Topical anesthesia was administered to the operative eye.     The patient was then to the operative suite and placed in the supine position.  A timeout was performed confirming the patient, procedure to be performed, and all other relevant information.   The patient's face was prepped and draped in the usual fashion for intra-ocular surgery.  A lid speculum was placed into the operative eye and the surgical microscope moved into place and focused.  An inferotemporal paracentesis was created using a 20 gauge paracentesis blade. Omidria was injected into the anterior chamber. Shugarcaine was injected into the anterior chamber.  Viscoelastic was injected into the anterior chamber.  A temporal clear-corneal main wound incision was created using a 2.99mm microkeratome.  A continuous curvilinear capsulorrhexis was initiated using an irrigating cystitome and completed using capsulorrhexis forceps.  Hydrodissection and hydrodeliniation were performed.  Viscoelastic was injected into the anterior chamber.  A phacoemulsification handpiece and a chopper as a second instrument were used to remove the nucleus and epinucleus. The irrigation/aspiration  handpiece was used to remove any remaining cortical material.   The capsular bag was reinflated with viscoelastic, checked, and found to be intact.  The intraocular lens was inserted into the capsular bag.  The irrigation/aspiration handpiece was used to remove any remaining viscoelastic.  The clear corneal wound and paracentesis wounds were then hydrated and checked with Weck-Cels to be watertight. 0.1mL of moxifloxacin was injected into the anterior chamber. The lid-speculum and drape was removed, and the patient's face was cleaned with a wet and dry 4x4. A clear shield was taped over the eye. The patient was taken to the post-operative care unit in good condition, having tolerated the procedure well.  Post-Op Instructions: The patient will follow up at [facility] for a same day post-operative evaluation and will receive all other orders and instructions.

## 2024-01-30 NOTE — Anesthesia Preprocedure Evaluation (Signed)
 Anesthesia Evaluation  Patient identified by MRN, date of birth, ID band Patient awake    Reviewed: Allergy & Precautions, H&P , NPO status , Patient's Chart, lab work & pertinent test results, reviewed documented beta blocker date and time   Airway Mallampati: II  TM Distance: >3 FB Neck ROM: full    Dental no notable dental hx. (+) Dental Advisory Given, Teeth Intact   Pulmonary neg pulmonary ROS   Pulmonary exam normal breath sounds clear to auscultation       Cardiovascular Exercise Tolerance: Good hypertension, Normal cardiovascular exam Rhythm:regular Rate:Normal     Neuro/Psych negative neurological ROS  negative psych ROS   GI/Hepatic Neg liver ROS,GERD  ,,  Endo/Other  negative endocrine ROS    Renal/GU negative Renal ROS  negative genitourinary   Musculoskeletal  (+) Arthritis , Osteoarthritis,    Abdominal   Peds  Hematology negative hematology ROS (+)   Anesthesia Other Findings Breast cancer  Reproductive/Obstetrics negative OB ROS                              Anesthesia Physical Anesthesia Plan  ASA: 2  Anesthesia Plan: MAC   Post-op Pain Management: Minimal or no pain anticipated   Induction:   PONV Risk Score and Plan:   Airway Management Planned: Nasal Cannula and Natural Airway  Additional Equipment: None  Intra-op Plan:   Post-operative Plan:   Informed Consent: I have reviewed the patients History and Physical, chart, labs and discussed the procedure including the risks, benefits and alternatives for the proposed anesthesia with the patient or authorized representative who has indicated his/her understanding and acceptance.     Dental Advisory Given  Plan Discussed with: CRNA  Anesthesia Plan Comments:         Anesthesia Quick Evaluation

## 2024-01-30 NOTE — Interval H&P Note (Signed)
 History and Physical Interval Note:  01/30/2024 11:21 AM  Janice Higgins  has presented today for surgery, with the diagnosis of nuclear sclerotic cataract, left eye.  The various methods of treatment have been discussed with the patient and family. After consideration of risks, benefits and other options for treatment, the patient has consented to  Procedure(s) with comments: PHACOEMULSIFICATION, CATARACT, WITH IOL INSERTION (Left) - CDE as a surgical intervention.  The patient's history has been reviewed, patient examined, no change in status, stable for surgery.  I have reviewed the patient's chart and labs.  Questions were answered to the patient's satisfaction.     Janice Higgins

## 2024-01-30 NOTE — Anesthesia Postprocedure Evaluation (Signed)
 Anesthesia Post Note  Patient: Janice Higgins  Procedure(s) Performed: PHACOEMULSIFICATION, CATARACT, WITH IOL INSERTION (Left: Eye)  Patient location during evaluation: Phase II Anesthesia Type: MAC Level of consciousness: awake and alert Pain management: pain level controlled Vital Signs Assessment: post-procedure vital signs reviewed and stable Respiratory status: spontaneous breathing, nonlabored ventilation and respiratory function stable Cardiovascular status: stable and blood pressure returned to baseline Postop Assessment: no apparent nausea or vomiting Anesthetic complications: no   There were no known notable events for this encounter.   Last Vitals:  Vitals:   01/30/24 1035 01/30/24 1148  BP: 118/75 121/73  Pulse: 76 78  Resp: 17 19  Temp: 36.7 C 36.5 C  SpO2: 95% 95%    Last Pain:  Vitals:   01/30/24 1148  TempSrc: Oral  PainSc: 0-No pain                 Anarosa Kubisiak L Maecie Sevcik

## 2024-01-30 NOTE — Transfer of Care (Signed)
 Immediate Anesthesia Transfer of Care Note  Patient: Janice Higgins  Procedure(s) Performed: PHACOEMULSIFICATION, CATARACT, WITH IOL INSERTION (Left: Eye)  Patient Location: Short Stay  Anesthesia Type:MAC  Level of Consciousness: awake, alert , oriented, and patient cooperative  Airway & Oxygen Therapy: Patient Spontanous Breathing  Post-op Assessment: Report given to RN, Post -op Vital signs reviewed and stable, and Patient moving all extremities X 4  Post vital signs: Reviewed and stable  Last Vitals:  Vitals Value Taken Time  BP    Temp    Pulse    Resp    SpO2      Last Pain:  Vitals:   01/30/24 1035  TempSrc: Oral  PainSc: 0-No pain      Patients Stated Pain Goal: 5 (01/30/24 1035)  Complications: No notable events documented.

## 2024-02-02 ENCOUNTER — Encounter (HOSPITAL_COMMUNITY): Payer: Self-pay | Admitting: Ophthalmology

## 2024-02-05 ENCOUNTER — Other Ambulatory Visit: Payer: Self-pay | Admitting: Internal Medicine

## 2024-02-05 ENCOUNTER — Other Ambulatory Visit (HOSPITAL_COMMUNITY): Payer: Self-pay | Admitting: Endocrinology

## 2024-02-05 DIAGNOSIS — Z0000 Encounter for general adult medical examination without abnormal findings: Secondary | ICD-10-CM

## 2024-02-06 ENCOUNTER — Other Ambulatory Visit: Payer: Self-pay

## 2024-02-06 ENCOUNTER — Other Ambulatory Visit (HOSPITAL_COMMUNITY): Payer: Self-pay

## 2024-02-06 MED ORDER — FAMOTIDINE 40 MG PO TABS
40.0000 mg | ORAL_TABLET | Freq: Every day | ORAL | 5 refills | Status: DC
  Filled 2024-02-06 – 2024-03-08 (×2): qty 30, 30d supply, fill #0
  Filled 2024-04-06: qty 30, 30d supply, fill #1
  Filled 2024-05-07: qty 30, 30d supply, fill #2
  Filled 2024-06-07: qty 30, 30d supply, fill #3
  Filled 2024-07-12: qty 30, 30d supply, fill #4

## 2024-02-09 ENCOUNTER — Encounter (HOSPITAL_COMMUNITY): Payer: Self-pay

## 2024-02-09 ENCOUNTER — Encounter (HOSPITAL_COMMUNITY)
Admission: RE | Admit: 2024-02-09 | Discharge: 2024-02-09 | Disposition: A | Discharge status: Home / Self Care | Source: Ambulatory Visit | Attending: Ophthalmology | Admitting: Ophthalmology

## 2024-02-10 DIAGNOSIS — H2511 Age-related nuclear cataract, right eye: Secondary | ICD-10-CM | POA: Diagnosis not present

## 2024-02-10 NOTE — H&P (Signed)
 Surgical History & Physical  Patient Name: Janice Higgins  DOB: [birth year]  Surgery: Cataract extraction with intraocular lens implant phacoemulsification; Right Eye Surgeon: Lynwood Hermann MD Surgery Date: 02/13/2024 Pre-Op Date: 02/10/2024  HPI: A 27 Yr. old female patient presents for an 11 day CEIOL PO OS and preop OD. Patient reports that her vision is blurred OS. Patient states that she went to see her optometrist since having sx and was informed that it was due to her excessive DES OS. Patient states that she was given iVizia to use PRN OS. Patient states that she uses it about 4-5x daily OS in addition to using Restasis  BID OD. Patient states that she has been compliant with her sx gtts. Denies any ocular pain, FOL or floaters. Patient states that her vision OD struggles with glare and night vision. Patient states that with her OD alone she is unable to see at night and struggles with small print. This is negatively affecting the patient's quality of life and the patient is unable to function adequately in life with the current level of vision. Patient states that she would like to proceed with cataract sx OD. HPI Completed by Dr. Lynwood Hermann  Medical History: Dry Eyes Cataracts Keratitis sicca (non sjogrens)  Acid reflux Arthritis Cancer High Blood Pressure  Review of Systems Negative Allergic/Immunologic HTN Cardiovascular Negative Constitutional Negative Endocrine Negative Eyes Acid Reflux Gastrointestinal Negative Genitourinary Negative Hemotologic/Lymphatic Negative Integumentary Arthritis Musculoskeletal Negative Neurological Negative Psychiatry Negative Respiratory  Social Never Smoked  Medication IVizia (PF), Restasis , Prednisolone-moxiflox-bromfen,  Blood pressure, Arthritis Pain Relief (acetam), Acid Reducer (omeprazole), Beneva injections  Sx/Procedures Lasik, CE/IOL, Breast Cancer surgery  Drug Allergies  Fosamax , Sulfa (Sulfonamide  Antibiotics)  History & Physical: Heent: cataract NECK: supple without bruits LUNGS: lungs clear to auscultation CV: regular rate and rhythm Abdomen: soft and non-tender  Impression & Plan: Assessment: 1.  CATARACT EXTRACTION STATUS; Left Eye (Z98.42) 2.  INTRAOCULAR LENS IOL (Z96.1) 3.  NUCLEAR SCLEROSIS AGE RELATED; , Right Eye (H25.11)  Plan: 1.  1 week after cataract surgery. Doing well with improved vision and normal eye pressure. Call with any problems or concerns. Continue Pred-Moxi-Brom 2x/day for 3 more weeks.  2.  Doing well since surgery Continue Post-op medications  3.  Cataract accounts for the patient's decreased vision. This visual impairment is not correctable with a tolerable change in glasses or contact lenses. Cataract surgery with an implantation of a new lens should significantly improve the visual and functional status of the patient. Discussed all risks, benefits, alternatives, and potential complications. Discussed the procedures and recovery. Patient desires to have surgery. A-scan ordered and performed today for intra-ocular lens calculations. The surgery will be performed in order to improve vision for driving, reading, and for eye examinations. Recommend phacoemulsification with intra-ocular lens. Recommend Dextenza  for post-operative pain and inflammation. Right eye. Goal [place]. Dilates poorly - shugarcaine by protocol. Vision Blue. Malyugin Ring. Omidira. S/P Laser Vision Correction - Myopic LASIK - Barrett True K.

## 2024-02-13 ENCOUNTER — Ambulatory Visit (HOSPITAL_BASED_OUTPATIENT_CLINIC_OR_DEPARTMENT_OTHER): Payer: Self-pay | Admitting: Anesthesiology

## 2024-02-13 ENCOUNTER — Ambulatory Visit (HOSPITAL_COMMUNITY)
Admission: RE | Admit: 2024-02-13 | Discharge: 2024-02-13 | Disposition: A | Discharge status: Home / Self Care | Attending: Ophthalmology | Admitting: Ophthalmology

## 2024-02-13 ENCOUNTER — Encounter (HOSPITAL_COMMUNITY)
Admission: RE | Disposition: A | Discharge status: Home / Self Care | Payer: Self-pay | Source: Home / Self Care | Attending: Ophthalmology

## 2024-02-13 ENCOUNTER — Ambulatory Visit (HOSPITAL_COMMUNITY): Payer: Self-pay | Admitting: Anesthesiology

## 2024-02-13 ENCOUNTER — Encounter (HOSPITAL_COMMUNITY): Payer: Self-pay | Admitting: Ophthalmology

## 2024-02-13 DIAGNOSIS — Z9842 Cataract extraction status, left eye: Secondary | ICD-10-CM | POA: Diagnosis not present

## 2024-02-13 DIAGNOSIS — I10 Essential (primary) hypertension: Secondary | ICD-10-CM | POA: Insufficient documentation

## 2024-02-13 DIAGNOSIS — Z961 Presence of intraocular lens: Secondary | ICD-10-CM | POA: Insufficient documentation

## 2024-02-13 DIAGNOSIS — K219 Gastro-esophageal reflux disease without esophagitis: Secondary | ICD-10-CM | POA: Diagnosis not present

## 2024-02-13 DIAGNOSIS — H2511 Age-related nuclear cataract, right eye: Secondary | ICD-10-CM

## 2024-02-13 HISTORY — PX: CATARACT EXTRACTION W/PHACO: SHX586

## 2024-02-13 SURGERY — PHACOEMULSIFICATION, CATARACT, WITH IOL INSERTION
Anesthesia: Monitor Anesthesia Care | Site: Eye | Laterality: Right

## 2024-02-13 MED ORDER — MIDAZOLAM HCL 2 MG/2ML IJ SOLN
INTRAMUSCULAR | Status: AC
Start: 2024-02-13 — End: 2024-02-13
  Filled 2024-02-13: qty 2

## 2024-02-13 MED ORDER — SODIUM HYALURONATE 10 MG/ML IO SOLUTION
PREFILLED_SYRINGE | INTRAOCULAR | Status: DC | PRN
Start: 2024-02-13 — End: 2024-02-13
  Administered 2024-02-13: .85 mL via INTRAOCULAR

## 2024-02-13 MED ORDER — PHENYLEPHRINE HCL 2.5 % OP SOLN
1.0000 [drp] | OPHTHALMIC | Status: AC | PRN
  Administered 2024-02-13 (×3): 1 [drp] via OPHTHALMIC

## 2024-02-13 MED ORDER — BSS IO SOLN
INTRAOCULAR | Status: DC | PRN
Start: 2024-02-13 — End: 2024-02-13
  Administered 2024-02-13: 15 mL via INTRAOCULAR

## 2024-02-13 MED ORDER — SODIUM CHLORIDE 0.9% FLUSH
INTRAVENOUS | Status: DC | PRN
  Administered 2024-02-13: 5 mL via INTRAVENOUS

## 2024-02-13 MED ORDER — TETRACAINE HCL 0.5 % OP SOLN
1.0000 [drp] | OPHTHALMIC | Status: AC | PRN
  Administered 2024-02-13 (×3): 1 [drp] via OPHTHALMIC

## 2024-02-13 MED ORDER — MOXIFLOXACIN HCL 5 MG/ML IO SOLN
INTRAOCULAR | Status: DC | PRN
  Administered 2024-02-13: .2 mL via INTRACAMERAL

## 2024-02-13 MED ORDER — PHENYLEPHRINE-KETOROLAC 1-0.3 % IO SOLN
INTRAOCULAR | Status: DC | PRN
  Administered 2024-02-13: 500 mL via OPHTHALMIC

## 2024-02-13 MED ORDER — LIDOCAINE HCL (PF) 1 % IJ SOLN
INTRAMUSCULAR | Status: DC | PRN
  Administered 2024-02-13: 1 mL

## 2024-02-13 MED ORDER — STERILE WATER FOR IRRIGATION IR SOLN
Status: DC | PRN
  Administered 2024-02-13: 1

## 2024-02-13 MED ORDER — SODIUM CHLORIDE (PF) 0.9 % IJ SOLN
INTRAMUSCULAR | Status: AC
  Filled 2024-02-13: qty 10

## 2024-02-13 MED ORDER — MIDAZOLAM HCL 2 MG/2ML IJ SOLN
INTRAMUSCULAR | Status: DC | PRN
  Administered 2024-02-13: 2 mg via INTRAVENOUS

## 2024-02-13 MED ORDER — POVIDONE-IODINE 5 % OP SOLN
OPHTHALMIC | Status: DC | PRN
  Administered 2024-02-13: 1 via OPHTHALMIC

## 2024-02-13 MED ORDER — SODIUM HYALURONATE 23MG/ML IO SOSY
PREFILLED_SYRINGE | INTRAOCULAR | Status: DC | PRN
  Administered 2024-02-13: .6 mL via INTRAOCULAR

## 2024-02-13 MED ORDER — LIDOCAINE HCL 3.5 % OP GEL
1.0000 | Freq: Once | OPHTHALMIC | Status: AC
  Administered 2024-02-13: 1 via OPHTHALMIC

## 2024-02-13 MED ORDER — TROPICAMIDE 1 % OP SOLN
1.0000 [drp] | OPHTHALMIC | Status: DC | PRN
  Administered 2024-02-13 (×2): 1 [drp] via OPHTHALMIC

## 2024-02-13 MED ORDER — LACTATED RINGERS IV SOLN: INTRAVENOUS | Status: DC

## 2024-02-13 SURGICAL SUPPLY — 12 items
CATARACT SUITE SIGHTPATH (MISCELLANEOUS) ×1 IMPLANT
CLOTH BEACON ORANGE TIMEOUT ST (SAFETY) ×1 IMPLANT
EYE SHIELD UNIVERSAL CLEAR (GAUZE/BANDAGES/DRESSINGS) IMPLANT
FEE CATARACT SUITE SIGHTPATH (MISCELLANEOUS) ×1 IMPLANT
GLOVE BIOGEL PI IND STRL 7.0 (GLOVE) ×2 IMPLANT
LENS IOL TECNIS EYHANCE 20.0 (Intraocular Lens) IMPLANT
NDL HYPO 18GX1.5 BLUNT FILL (NEEDLE) ×1 IMPLANT
NEEDLE HYPO 18GX1.5 BLUNT FILL (NEEDLE) ×1 IMPLANT
PAD ARMBOARD POSITIONER FOAM (MISCELLANEOUS) ×1 IMPLANT
SYR TB 1ML LL NO SAFETY (SYRINGE) ×1 IMPLANT
TAPE SURG TRANSPORE 1 IN (GAUZE/BANDAGES/DRESSINGS) IMPLANT
WATER STERILE IRR 250ML POUR (IV SOLUTION) ×1 IMPLANT

## 2024-02-13 NOTE — Interval H&P Note (Signed)
 History and Physical Interval Note:  02/13/2024 9:53 AM  Janice Higgins  has presented today for surgery, with the diagnosis of nuclear sclerotic cataracts, right eye.  The various methods of treatment have been discussed with the patient and family. After consideration of risks, benefits and other options for treatment, the patient has consented to  Procedure(s): PHACOEMULSIFICATION, CATARACT, WITH IOL INSERTION (Right) as a surgical intervention.  The patient's history has been reviewed, patient examined, no change in status, stable for surgery.  I have reviewed the patient's chart and labs.  Questions were answered to the patient's satisfaction.     HARRIE AGENT

## 2024-02-13 NOTE — Op Note (Signed)
 Date of procedure: 02/13/24  Pre-operative diagnosis: Visually significant age-related nuclear cataract, Right Eye (H25.11)  Post-operative diagnosis: Visually significant age-related nuclear cataract, Right Eye  Procedure: Removal of cataract via phacoemulsification and insertion of intra-ocular lens Vicci and Johnson DIB00 +20.0D into the capsular bag of the Right Eye  Attending surgeon: Lynwood LABOR. Kolsen Choe, MD, MA  Anesthesia: MAC, Topical Akten   Complications: None  Estimated Blood Loss: <8mL (minimal)  Specimens: None  Implants: As above  Indications:  Visually significant age-related cataract, Right Eye  Procedure:  The patient was seen and identified in the pre-operative area. The operative eye was identified and dilated.  The operative eye was marked.  Topical anesthesia was administered to the operative eye.     The patient was then to the operative suite and placed in the supine position.  A timeout was performed confirming the patient, procedure to be performed, and all other relevant information.   The patient's face was prepped and draped in the usual fashion for intra-ocular surgery.  A lid speculum was placed into the operative eye and the surgical microscope moved into place and focused.  A superotemporal paracentesis was created using a 20 gauge paracentesis blade. Omidria  was injected into the anterior chamber. Shugarcaine was injected into the anterior chamber.  Viscoelastic was injected into the anterior chamber.  A temporal clear-corneal main wound incision was created using a 2.28mm microkeratome.  A continuous curvilinear capsulorrhexis was initiated using an irrigating cystitome and completed using capsulorrhexis forceps.  Hydrodissection and hydrodeliniation were performed.  Viscoelastic was injected into the anterior chamber.  A phacoemulsification handpiece and a chopper as a second instrument were used to remove the nucleus and epinucleus. The irrigation/aspiration  handpiece was used to remove any remaining cortical material.   The capsular bag was reinflated with viscoelastic, checked, and found to be intact.  The intraocular lens was inserted into the capsular bag.  The irrigation/aspiration handpiece was used to remove any remaining viscoelastic.  The clear corneal wound and paracentesis wounds were then hydrated and checked with Weck-Cels to be watertight. 0.1mL of moxifloxacin  was injected into the anterior chamber. The lid-speculum and drape was removed, and the patient's face was cleaned with a wet and dry 4x4.  A clear shield was taped over the eye. The patient was taken to the post-operative care unit in good condition, having tolerated the procedure well.  Post-Op Instructions: The patient will follow up at [facility] for a same day post-operative evaluation and will receive all other orders and instructions.

## 2024-02-13 NOTE — Anesthesia Preprocedure Evaluation (Signed)
 Anesthesia Evaluation  Patient identified by MRN, date of birth, ID band Patient awake    Reviewed: Allergy & Precautions, H&P , NPO status , Patient's Chart, lab work & pertinent test results, reviewed documented beta blocker date and time   Airway Mallampati: II  TM Distance: >3 FB Neck ROM: full    Dental no notable dental hx. (+) Dental Advisory Given, Teeth Intact   Pulmonary neg pulmonary ROS   Pulmonary exam normal breath sounds clear to auscultation       Cardiovascular Exercise Tolerance: Good hypertension, Normal cardiovascular exam Rhythm:regular Rate:Normal     Neuro/Psych negative neurological ROS  negative psych ROS   GI/Hepatic Neg liver ROS,GERD  ,,  Endo/Other  negative endocrine ROS    Renal/GU negative Renal ROS  negative genitourinary   Musculoskeletal  (+) Arthritis , Osteoarthritis,    Abdominal   Peds  Hematology negative hematology ROS (+)   Anesthesia Other Findings Breast cancer  Reproductive/Obstetrics negative OB ROS                              Anesthesia Physical Anesthesia Plan  ASA: 2  Anesthesia Plan: MAC   Post-op Pain Management: Minimal or no pain anticipated   Induction:   PONV Risk Score and Plan:   Airway Management Planned: Nasal Cannula and Natural Airway  Additional Equipment: None  Intra-op Plan:   Post-operative Plan:   Informed Consent: I have reviewed the patients History and Physical, chart, labs and discussed the procedure including the risks, benefits and alternatives for the proposed anesthesia with the patient or authorized representative who has indicated his/her understanding and acceptance.     Dental Advisory Given  Plan Discussed with: CRNA  Anesthesia Plan Comments:         Anesthesia Quick Evaluation

## 2024-02-13 NOTE — Anesthesia Postprocedure Evaluation (Signed)
 Anesthesia Post Note  Patient: Janice Higgins  Procedure(s) Performed: PHACOEMULSIFICATION, CATARACT, WITH IOL INSERTION (Right: Eye)  Patient location during evaluation: Phase II Anesthesia Type: MAC Level of consciousness: awake Pain management: pain level controlled Vital Signs Assessment: post-procedure vital signs reviewed and stable Respiratory status: spontaneous breathing and respiratory function stable Cardiovascular status: blood pressure returned to baseline and stable Postop Assessment: no headache and no apparent nausea or vomiting Anesthetic complications: no Comments: Late entry   No notable events documented.   Last Vitals:  Vitals:   02/13/24 0930 02/13/24 1014  BP: 127/78 119/72  Pulse: 80 77  Resp: 19 18  Temp: 36.6 C 36.5 C  SpO2: 97% 100%    Last Pain:  Vitals:   02/13/24 1014  TempSrc: Oral  PainSc: 2                  [name]

## 2024-02-13 NOTE — Transfer of Care (Signed)
 Immediate Anesthesia Transfer of Care Note  Patient: Janice Higgins  Procedure(s) Performed: PHACOEMULSIFICATION, CATARACT, WITH IOL INSERTION (Right: Eye)  Patient Location: Short Stay  Anesthesia Type:MAC  Level of Consciousness: awake, alert , and oriented  Airway & Oxygen Therapy: Patient Spontanous Breathing  Post-op Assessment: Report given to RN and Post -op Vital signs reviewed and stable  Post vital signs: Reviewed and stable  Last Vitals:  Vitals Value Taken Time  BP 119/72 02/13/24 10:14  Temp 36.5 C 02/13/24 10:14  Pulse 77 02/13/24 10:14  Resp 18 02/13/24 10:14  SpO2 100 % 02/13/24 10:14    Last Pain:  Vitals:   02/13/24 1014  TempSrc: Oral  PainSc: 2          Complications: No notable events documented.

## 2024-02-13 NOTE — Discharge Instructions (Addendum)
 Please discharge patient when stable, will follow up today with Dr. June Leap at the [facility] office immediately following discharge.  Leave shield in place until visit.  All paperwork with discharge instructions will be given at the office.  [facility] Address:  [address]  [place], [place] [address]

## 2024-02-16 ENCOUNTER — Other Ambulatory Visit (HOSPITAL_BASED_OUTPATIENT_CLINIC_OR_DEPARTMENT_OTHER): Payer: Self-pay

## 2024-02-16 ENCOUNTER — Encounter (HOSPITAL_COMMUNITY): Payer: Self-pay | Admitting: Ophthalmology

## 2024-02-16 ENCOUNTER — Ambulatory Visit: Payer: Commercial Managed Care - PPO | Admitting: Internal Medicine

## 2024-02-16 ENCOUNTER — Other Ambulatory Visit (HOSPITAL_COMMUNITY): Payer: Self-pay

## 2024-02-16 VITALS — BP 129/79 | HR 88 | Ht 61.0 in | Wt 172.2 lb

## 2024-02-16 DIAGNOSIS — E559 Vitamin D deficiency, unspecified: Secondary | ICD-10-CM

## 2024-02-16 DIAGNOSIS — E782 Mixed hyperlipidemia: Secondary | ICD-10-CM | POA: Diagnosis not present

## 2024-02-16 DIAGNOSIS — M1711 Unilateral primary osteoarthritis, right knee: Secondary | ICD-10-CM

## 2024-02-16 DIAGNOSIS — Z0001 Encounter for general adult medical examination with abnormal findings: Secondary | ICD-10-CM | POA: Diagnosis not present

## 2024-02-16 DIAGNOSIS — I10 Essential (primary) hypertension: Secondary | ICD-10-CM | POA: Diagnosis not present

## 2024-02-16 DIAGNOSIS — E669 Obesity, unspecified: Secondary | ICD-10-CM

## 2024-02-16 DIAGNOSIS — K219 Gastro-esophageal reflux disease without esophagitis: Secondary | ICD-10-CM

## 2024-02-16 DIAGNOSIS — S22060D Wedge compression fracture of T7-T8 vertebra, subsequent encounter for fracture with routine healing: Secondary | ICD-10-CM

## 2024-02-16 DIAGNOSIS — R7303 Prediabetes: Secondary | ICD-10-CM

## 2024-02-16 MED ORDER — AMLODIPINE BESYLATE 5 MG PO TABS
5.0000 mg | ORAL_TABLET | Freq: Every day | ORAL | 3 refills | Status: AC
  Filled 2024-02-16 – 2024-04-06 (×2): qty 90, 90d supply, fill #0
  Filled 2024-07-12: qty 90, 90d supply, fill #1

## 2024-02-16 MED ORDER — PHENTERMINE HCL 37.5 MG PO TABS
37.5000 mg | ORAL_TABLET | Freq: Every day | ORAL | 2 refills | Status: DC
Start: 2024-02-16 — End: 2024-05-18

## 2024-02-16 MED ORDER — HYDROCODONE-ACETAMINOPHEN 5-325 MG PO TABS
1.0000 | ORAL_TABLET | Freq: Every evening | ORAL | 0 refills | Status: AC | PRN
  Filled 2024-02-16: qty 45, 45d supply, fill #0

## 2024-02-16 MED ORDER — LISINOPRIL 5 MG PO TABS
5.0000 mg | ORAL_TABLET | Freq: Every day | ORAL | 3 refills | Status: AC
  Filled 2024-02-16: qty 90, 90d supply, fill #0
  Filled 2024-05-31: qty 90, 90d supply, fill #1

## 2024-02-16 NOTE — Progress Notes (Signed)
 Established Patient Office Visit  Subjective:  Patient ID: Janice Higgins, female    DOB: 1956/04/01  Age: 68 y.o. MRN: [id number]  CC:  Chief Complaint  Patient presents with   Annual Exam    HPI Janice Higgins is a 68 y.o. female with past medical history of HTN, GERD, osteopenia, breast ca. s/p lumpectomy, OA and obesity who presents for annual physical.  HTN: BP is well-controlled. Takes medications regularly. Patient denies headache, dizziness, chest pain, dyspnea or palpitations.  Her DEXA scan showed osteopenia.  She has tried oral bisphosphonate, but had severe esophagitis with it.  She has been tolerating Boniva  well.  She takes Pepcid  for GERD now. Denies any nausea, vomiting, melena or hematochezia.  She is concerned about her weight.  She had lost more than 40 pounds with Wegovy , but has gained about 30 pounds since stopping the Wegovy  in 04/24.  She states that she has been trying to control her urges for eating, but is difficult at times.  She has had improvement in urinary urgency with pessary placement, but has had removal now.  She has had recurrence of urinary incontinence now.  She takes Detrol  for it, but is not effective.  She is advised for surgical repair of uterine prolapse by her OB/GYN.  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   Family history of breast cancer    History of breast cancer 04/25/2015   History of bronchitis    History of kidney stones    Hypertension    Osteoarthritis    hands and feet   Osteoporosis    PMB (postmenopausal bleeding) 04/25/2015   Radiation 10/13/14-11/04/14   Left breast    Past Surgical History:  Procedure Laterality Date   BREAST LUMPECTOMY WITH RADIOACTIVE SEED LOCALIZATION Left 08/12/2014   Procedure:  RADIOACTIVE SEED LOCALIZATION LEFT BREAST LUMPECTOMY;  Surgeon: Morene Olives, MD;  Location: [facility];   Service: General;  Laterality: Left;   CATARACT EXTRACTION W/PHACO Left 01/30/2024   Procedure: PHACOEMULSIFICATION, CATARACT, WITH IOL INSERTION;  Surgeon: Harrie Agent, MD;  Location: [facility];  Service: Ophthalmology;  Laterality: Left;  CDE 11.45   CATARACT EXTRACTION W/PHACO Right 02/13/2024   Procedure: PHACOEMULSIFICATION, CATARACT, WITH IOL INSERTION;  Surgeon: Harrie Agent, MD;  Location: [facility];  Service: Ophthalmology;  Laterality: Right;  CDE: 10.15   COLONOSCOPY N/A 05/04/2015   Procedure: COLONOSCOPY;  Surgeon: Claudis RAYMOND Rivet, MD;  Location: [facility];  Service: Endoscopy;  Laterality: N/A;  7:30   FOOT SURGERY Right 1995   exc. bone spur   LASIK     RE-EXCISION OF BREAST LUMPECTOMY Left 08/22/2014   Procedure: RE-EXCISION OF LEFT BREAST LUMPECTOMY;  Surgeon: Morene Olives, MD;  Location: [facility] OR;  Service: General;  Laterality: Left;   RE-EXCISION OF BREAST LUMPECTOMY Left 09/05/2014   Procedure: RE-EXCISION OF BREAST LUMPECTOMY;  Surgeon: Morene Olives, MD;  Location:  SURGERY CENTER;  Service: General;  Laterality: Left;   TONSILLECTOMY     TUBAL LIGATION      Family History  Problem Relation Age of Onset   Hypertension Father    Hypertension Mother    Heart disease Mother 61   Breast cancer Sister 80   Other Brother        lived 3 months   Heart attack Maternal Grandfather    Emphysema Paternal Grandfather  COPD Paternal Grandfather     Social History   Socioeconomic History   Marital status: Married    Spouse name: Not on file   Number of children: Not on file   Years of education: Not on file   Highest education level: GED or equivalent  Occupational History   Not on file  Tobacco Use   Smoking status: Never   Smokeless tobacco: Never  Vaping Use   Vaping status: Never Used  Substance and Sexual Activity   Alcohol use: No   Drug use: No   Sexual activity: Yes    Birth control/protection: Post-menopausal, Surgical    Comment:  tubal  Other Topics Concern   Not on file  Social History Narrative   Not on file   Social Drivers of Health   Financial Resource Strain: Low Risk  (02/12/2024)   Overall Financial Resource Strain (CARDIA)    Difficulty of Paying Living Expenses: Not hard at all  Food Insecurity: No Food Insecurity (02/12/2024)   Hunger Vital Sign    Worried About Running Out of Food in the Last Year: Never true    Ran Out of Food in the Last Year: Never true  Transportation Needs: No Transportation Needs (02/12/2024)   PRAPARE - Administrator, Civil Service (Medical): No    Lack of Transportation (Non-Medical): No  Physical Activity: Inactive (02/12/2024)   Exercise Vital Sign    Days of Exercise per Week: 0 days    Minutes of Exercise per Session: Not on file  Stress: No Stress Concern Present (02/12/2024)   [employer] of Occupational Health - Occupational Stress Questionnaire    Feeling of Stress: Not at all  Social Connections: Socially Integrated (02/12/2024)   Social Connection and Isolation Panel    Frequency of Communication with Friends and Family: More than three times a week    Frequency of Social Gatherings with Friends and Family: Once a week    Attends Religious Services: More than 4 times per year    Active Member of [employer] or Organizations: Yes    Attends Banker Meetings: 1 to 4 times per year    Marital Status: Married  Catering manager Violence: Not At Risk (10/16/2022)   Humiliation, Afraid, Rape, and Kick questionnaire    Fear of Current or Ex-Partner: No    Emotionally Abused: No    Physically Abused: No    Sexually Abused: No    Outpatient Medications Prior to Visit  Medication Sig Dispense Refill   albuterol  (VENTOLIN  HFA) 108 (90 Base) MCG/ACT inhaler Inhale 2 puffs into the lungs every 4 (four) hours as needed for wheezing or shortness of breath. 1 each 3   cycloSPORINE  (RESTASIS ) 0.05 % ophthalmic emulsion Place 1 drop into both eyes 2  (two) times daily. 60 each 6   diclofenac  (VOLTAREN ) 75 MG EC tablet Take 1 tablet (75 mg total) by mouth 2 (two) times daily. 90 tablet 1   estradiol  (ESTRACE  VAGINAL) 0.1 MG/GM vaginal cream Pea-sized amount once per week 42.5 g 2   famotidine  (PEPCID ) 40 MG tablet Take 1 tablet (40 mg total) by mouth daily. 30 tablet 5   ibandronate  (BONIVA ) 3 MG/3ML SOLN injection Inject 3 mLs (3 mg total) into the vein every 3 (three) months. 3 mL 3   tolterodine  (DETROL  LA) 2 MG 24 hr capsule Take 1 capsule (2 mg total) by mouth daily. 90 capsule 4   amLODipine  (NORVASC ) 5 MG tablet Take 1 tablet (  5 mg total) by mouth daily. 90 tablet 3   HYDROcodone -acetaminophen  (NORCO/VICODIN) 5-325 MG tablet Take 1 tablet by mouth at bedtime as needed for pain 45 tablet 0   lisinopril  (ZESTRIL ) 5 MG tablet Take 1 tablet (5 mg total) by mouth daily. 90 tablet 3   nitrofurantoin , macrocrystal-monohydrate, (MACROBID ) 100 MG capsule Take 1 capsule (100 mg total) by mouth 2 (two) times daily. 14 capsule 0   No facility-administered medications prior to visit.    Allergies  Allergen Reactions   Fosamax  [Alendronate  Sodium] Other (See Comments)    Severe GI Upset   Sulfa Antibiotics Hives    ROS Review of Systems  Constitutional:  Negative for chills and fever.  HENT:  Negative for congestion, sinus pressure, sinus pain and sore throat.   Eyes:  Negative for pain and discharge.  Respiratory:  Negative for cough and shortness of breath.   Cardiovascular:  Negative for chest pain and palpitations.  Gastrointestinal:  Negative for abdominal pain, diarrhea, nausea and vomiting.  Endocrine: Negative for polydipsia and polyuria.  Genitourinary:  Positive for urgency. Negative for dysuria and hematuria.  Musculoskeletal:  Positive for arthralgias and back pain. Negative for neck pain and neck stiffness.  Skin:  Negative for rash.  Neurological:  Negative for dizziness and weakness.  Psychiatric/Behavioral:  Negative for  agitation and behavioral problems.       Objective:    Physical Exam Vitals reviewed.  Constitutional:      General: She is not in acute distress.    Appearance: She is obese. She is not diaphoretic.  HENT:     Head: Normocephalic and atraumatic.     Nose: Nose normal.     Mouth/Throat:     Mouth: Mucous membranes are moist.  Eyes:     General: No scleral icterus.    Extraocular Movements: Extraocular movements intact.  Cardiovascular:     Rate and Rhythm: Normal rate and regular rhythm.     Heart sounds: Normal heart sounds. No murmur heard. Pulmonary:     Breath sounds: Normal breath sounds. No wheezing or rales.  Abdominal:     Palpations: Abdomen is soft.     Tenderness: There is no abdominal tenderness.  Musculoskeletal:     Cervical back: Neck supple. No tenderness.     Right knee: Swelling present. Tenderness present.     Right lower leg: No edema.     Left lower leg: No edema.  Skin:    General: Skin is warm.     Findings: No rash.  Neurological:     General: No focal deficit present.     Mental Status: She is alert and oriented to person, place, and time.     Cranial Nerves: No cranial nerve deficit.     Sensory: No sensory deficit.     Motor: No weakness.  Psychiatric:        Mood and Affect: Mood normal.        Behavior: Behavior normal.     BP 129/79   Pulse 88   Ht 5' 1 (1.549 m)   Wt 172 lb 3.2 oz (78.1 kg)   SpO2 97%   BMI 32.54 kg/m  Wt Readings from Last 3 Encounters:  02/16/24 172 lb 3.2 oz (78.1 kg)  02/09/24 170 lb (77.1 kg)  01/28/24 170 lb (77.1 kg)    Lab Results  Component Value Date   TSH 2.150 02/05/2023   Lab Results  Component Value Date   WBC 7.5  02/05/2023   HGB 12.9 02/05/2023   HCT 37.3 02/05/2023   MCV 94 02/05/2023   PLT 353 02/05/2023   Lab Results  Component Value Date   NA 146 (H) 02/05/2023   K 4.1 02/05/2023   CO2 23 02/05/2023   GLUCOSE 99 02/05/2023   BUN 18 02/05/2023   CREATININE 0.69 02/05/2023    BILITOT <0.2 02/05/2023   ALKPHOS 61 02/05/2023   AST 22 02/05/2023   ALT 26 02/05/2023   PROT 6.4 02/05/2023   ALBUMIN 4.3 02/05/2023   CALCIUM 9.7 02/05/2023   ANIONGAP 5 04/03/2021   EGFR 96 02/05/2023   Lab Results  Component Value Date   CHOL 149 07/24/2022   Lab Results  Component Value Date   HDL 56 07/24/2022   Lab Results  Component Value Date   LDLCALC 76 07/24/2022   Lab Results  Component Value Date   TRIG 94 07/24/2022   Lab Results  Component Value Date   CHOLHDL 2.7 07/24/2022   Lab Results  Component Value Date   HGBA1C 5.6 02/05/2023      Assessment & Plan:   Problem List Items Addressed This Visit    Problem List Items Addressed This Visit       Cardiovascular and Mediastinum   Essential hypertension   BP Readings from Last 1 Encounters:  02/16/24 129/79   Well-controlled with lisinopril  and amlodipine  Counseled for compliance with the medications Advised DASH diet and moderate exercise/walking, at least 150 mins/week      Relevant Medications   amLODipine  (NORVASC ) 5 MG tablet   lisinopril  (ZESTRIL ) 5 MG tablet   Other Relevant Orders   TSH   CMP14+EGFR   CBC with Differential/Platelet     Digestive   Gastroesophageal reflux disease without esophagitis   Continue Pepcid  only If worsening of acid reflux, advised to take omeprazole as needed        Musculoskeletal and Integument   Unilateral primary osteoarthritis, right knee   Well-controlled with Voltaren  p.o. Followed by orthopedic surgeon Takes Norco as needed for severe pain Needs to lose weight      Relevant Medications   HYDROcodone -acetaminophen  (NORCO/VICODIN) 5-325 MG tablet   Compression fracture of thoracic vertebra with routine healing   Relevant Medications   HYDROcodone -acetaminophen  (NORCO/VICODIN) 5-325 MG tablet     Other   Encounter for general adult medical examination with abnormal findings - Primary   Physical exam as documented. Counseling  done  re healthy lifestyle involving commitment to 150 minutes exercise per week, heart healthy diet, and attaining healthy weight.The importance of adequate sleep also discussed. Changes in health habits are decided on by the patient with goals and time frames  set for achieving them. Immunization and cancer screening needs are specifically addressed at this visit.      Obesity   BMI Readings from Last 3 Encounters:  02/16/24 32.54 kg/m  02/09/24 31.60 kg/m  01/28/24 31.60 kg/m   Had lost > 40 lbs with Wegovy , had to stop due to insurance coverage concern Advised to follow low-carb diet and perform more moderate exercise/walking at least 150 minutes/week Added Phentermine  37.5 mg QD - advised to take half tablet for 1 month and then 1 tablet QD      Relevant Medications   phentermine  (ADIPEX-P ) 37.5 MG tablet   Prediabetes   Lab Results  Component Value Date   HGBA1C 5.6 02/05/2023   Advised to follow low-carb diet for now      Relevant Orders  Hemoglobin A1c   CMP14+EGFR   Other Visit Diagnoses       Mixed hyperlipidemia       Relevant Medications   amLODipine  (NORVASC ) 5 MG tablet   lisinopril  (ZESTRIL ) 5 MG tablet   Other Relevant Orders   Lipid panel     Vitamin D  deficiency       Relevant Orders   VITAMIN D  25 Hydroxy (Vit-D Deficiency, Fractures)         Meds ordered this encounter  Medications   phentermine  (ADIPEX-P ) 37.5 MG tablet    Sig: Take 1 tablet (37.5 mg total) by mouth daily before breakfast.    Dispense:  30 tablet    Refill:  2   amLODipine  (NORVASC ) 5 MG tablet    Sig: Take 1 tablet (5 mg total) by mouth daily.    Dispense:  90 tablet    Refill:  3   lisinopril  (ZESTRIL ) 5 MG tablet    Sig: Take 1 tablet (5 mg total) by mouth daily.    Dispense:  90 tablet    Refill:  3   HYDROcodone -acetaminophen  (NORCO/VICODIN) 5-325 MG tablet    Sig: Take 1 tablet by mouth at bedtime as needed for pain    Dispense:  45 tablet    Refill:  0     Follow-up: Return in about 3 months (around 05/18/2024) for HTN and weight management.    Suzzane MARLA Blanch, MD

## 2024-02-16 NOTE — Assessment & Plan Note (Signed)
 Continue Pepcid only If worsening of acid reflux, advised to take omeprazole as needed

## 2024-02-16 NOTE — Patient Instructions (Addendum)
 Please start taking Phentermine  half tablet once daily for 1 month and then increase to 1 tablet once daily.  Please continue to take medications as prescribed.  Please continue to follow low carb diet and perform moderate exercise/walking at least 150 mins/week.  Please get fasting blood tests done within a week.

## 2024-02-16 NOTE — Assessment & Plan Note (Addendum)
 BMI Readings from Last 3 Encounters:  02/16/24 32.54 kg/m  02/09/24 31.60 kg/m  01/28/24 31.60 kg/m   Had lost > 40 lbs with Wegovy , had to stop due to insurance coverage concern Advised to follow low-carb diet and perform more moderate exercise/walking at least 150 minutes/week Added Phentermine  37.5 mg QD - advised to take half tablet for 1 month and then 1 tablet QD

## 2024-02-16 NOTE — Assessment & Plan Note (Signed)
 BP Readings from Last 1 Encounters:  02/16/24 129/79   Well-controlled with lisinopril  and amlodipine  Counseled for compliance with the medications Advised DASH diet and moderate exercise/walking, at least 150 mins/week

## 2024-02-16 NOTE — Assessment & Plan Note (Signed)
 Lab Results  Component Value Date   HGBA1C 5.6 02/05/2023   Advised to follow low-carb diet for now

## 2024-02-16 NOTE — Assessment & Plan Note (Signed)
 Well-controlled with Voltaren  p.o. Followed by orthopedic surgeon Takes Norco as needed for severe pain Needs to lose weight

## 2024-02-16 NOTE — Assessment & Plan Note (Signed)

## 2024-02-18 ENCOUNTER — Other Ambulatory Visit (HOSPITAL_COMMUNITY): Payer: Self-pay | Admitting: Internal Medicine

## 2024-02-18 DIAGNOSIS — R7303 Prediabetes: Secondary | ICD-10-CM | POA: Diagnosis not present

## 2024-02-18 DIAGNOSIS — E782 Mixed hyperlipidemia: Secondary | ICD-10-CM | POA: Diagnosis not present

## 2024-02-18 DIAGNOSIS — E559 Vitamin D deficiency, unspecified: Secondary | ICD-10-CM | POA: Diagnosis not present

## 2024-02-18 DIAGNOSIS — I10 Essential (primary) hypertension: Secondary | ICD-10-CM | POA: Diagnosis not present

## 2024-02-18 DIAGNOSIS — Z1231 Encounter for screening mammogram for malignant neoplasm of breast: Secondary | ICD-10-CM

## 2024-02-19 ENCOUNTER — Ambulatory Visit: Payer: Self-pay | Admitting: Internal Medicine

## 2024-02-19 LAB — CBC WITH DIFFERENTIAL/PLATELET
Basophils Absolute: 0.1 x10E3/uL (ref 0.0–0.2)
Basos: 1 %
EOS (ABSOLUTE): 0.3 x10E3/uL (ref 0.0–0.4)
Eos: 5 %
Hematocrit: 42.6 % (ref 34.0–46.6)
Hemoglobin: 14.2 g/dL (ref 11.1–15.9)
Immature Grans (Abs): 0 x10E3/uL (ref 0.0–0.1)
Immature Granulocytes: 0 %
Lymphocytes Absolute: 2.3 x10E3/uL (ref 0.7–3.1)
Lymphs: 37 %
MCH: 31.1 pg (ref 26.6–33.0)
MCHC: 33.3 g/dL (ref 31.5–35.7)
MCV: 93 fL (ref 79–97)
Monocytes Absolute: 0.5 x10E3/uL (ref 0.1–0.9)
Monocytes: 8 %
Neutrophils Absolute: 2.9 x10E3/uL (ref 1.4–7.0)
Neutrophils: 49 %
Platelets: 317 x10E3/uL (ref 150–450)
RBC: 4.56 x10E6/uL (ref 3.77–5.28)
RDW: 12.7 % (ref 11.7–15.4)
WBC: 6.1 x10E3/uL (ref 3.4–10.8)

## 2024-02-19 LAB — CMP14+EGFR
ALT: 20 IU/L (ref 0–32)
AST: 21 IU/L (ref 0–40)
Albumin: 4.6 g/dL (ref 3.9–4.9)
Alkaline Phosphatase: 44 IU/L (ref 44–121)
BUN/Creatinine Ratio: 23 (ref 12–28)
BUN: 17 mg/dL (ref 8–27)
Bilirubin Total: 0.5 mg/dL (ref 0.0–1.2)
CO2: 22 mmol/L (ref 20–29)
Calcium: 10.5 mg/dL — ABNORMAL HIGH (ref 8.7–10.3)
Chloride: 102 mmol/L (ref 96–106)
Creatinine, Ser: 0.74 mg/dL (ref 0.57–1.00)
Globulin, Total: 2.6 g/dL (ref 1.5–4.5)
Glucose: 97 mg/dL (ref 70–99)
Potassium: 4.1 mmol/L (ref 3.5–5.2)
Sodium: 142 mmol/L (ref 134–144)
Total Protein: 7.2 g/dL (ref 6.0–8.5)
eGFR: 89 mL/min/1.73 (ref >59)

## 2024-02-19 LAB — LIPID PANEL
Chol/HDL Ratio: 2.8 ratio (ref 0.0–4.4)
Cholesterol, Total: 188 mg/dL (ref 100–199)
HDL: 66 mg/dL (ref >39)
LDL Chol Calc (NIH): 105 mg/dL — ABNORMAL HIGH (ref 0–99)
Triglycerides: 92 mg/dL (ref 0–149)
VLDL Cholesterol Cal: 17 mg/dL (ref 5–40)

## 2024-02-19 LAB — HEMOGLOBIN A1C
Est. average glucose Bld gHb Est-mCnc: 114 mg/dL
Hgb A1c MFr Bld: 5.6 % (ref 4.8–5.6)

## 2024-02-19 LAB — VITAMIN D 25 HYDROXY (VIT D DEFICIENCY, FRACTURES): Vit D, 25-Hydroxy: 33.9 ng/mL (ref 30.0–100.0)

## 2024-02-19 LAB — TSH: TSH: 2.78 u[IU]/mL (ref 0.450–4.500)

## 2024-02-26 ENCOUNTER — Encounter (HOSPITAL_COMMUNITY): Payer: Self-pay

## 2024-02-26 ENCOUNTER — Ambulatory Visit (HOSPITAL_COMMUNITY)
Admission: RE | Admit: 2024-02-26 | Discharge: 2024-02-26 | Disposition: A | Discharge status: Home / Self Care | Source: Ambulatory Visit | Attending: Internal Medicine | Admitting: Internal Medicine

## 2024-02-26 DIAGNOSIS — Z1231 Encounter for screening mammogram for malignant neoplasm of breast: Secondary | ICD-10-CM | POA: Insufficient documentation

## 2024-03-01 ENCOUNTER — Other Ambulatory Visit (INDEPENDENT_AMBULATORY_CARE_PROVIDER_SITE_OTHER): Payer: Self-pay

## 2024-03-01 ENCOUNTER — Ambulatory Visit (INDEPENDENT_AMBULATORY_CARE_PROVIDER_SITE_OTHER): Admitting: Orthopedic Surgery

## 2024-03-01 VITALS — BP 126/75 | HR 102 | Ht 61.5 in | Wt 163.0 lb

## 2024-03-01 DIAGNOSIS — M25561 Pain in right knee: Secondary | ICD-10-CM

## 2024-03-01 DIAGNOSIS — M1711 Unilateral primary osteoarthritis, right knee: Secondary | ICD-10-CM | POA: Diagnosis not present

## 2024-03-01 DIAGNOSIS — G8929 Other chronic pain: Secondary | ICD-10-CM

## 2024-03-01 NOTE — Progress Notes (Signed)
  Intake history:  BP 126/75   Pulse (!) 102   Ht 5' 1.5 (1.562 m)   Wt 163 lb (73.9 kg)   BMI 30.30 kg/m  Body mass index is 30.3 kg/m.    WHAT ARE WE SEEING YOU FOR TODAY?   right knee(s)  How Darreon Lutes has this bothered you? (DOI?DOS?WS?)  2 week(s) ago  Was there an injury? No  Anticoag.  No  Diabetes No  Heart disease No  Hypertension Yes  SMOKING HX No  Kidney disease No  Any ALLERGIES ______________________________________________   Treatment:  Have you taken:  Tylenol  Yes  Advil  Yes  Had PT No  Had injection No  Other  __arthritis creme_, 1/2 pain pill when its real bad______________________

## 2024-03-01 NOTE — Progress Notes (Signed)
   Chief Complaint  Patient presents with   Knee Pain    Right- started about 2 weeks ago used creme and taking my arthritis medicine but it hasn't helped but of course today it feels better    History 68 year old female presents with 3-week history of intense right knee pain.  She points to the anteromedial aspect of her right knee.  She tried various topical medications she has taken her diclofenac  she used ice and heat and did not get good relief however, today the pain has subsided significantly  Examination reveals pain with terminal extension of the right knee flexion arc is 0 to 125 degrees no lateral tenderness mild medial tenderness no effusion no instability skin looks good no rash or erythema  Imaging  DG Knee AP/LAT W/Sunrise Right Result Date: 03/01/2024 X-ray report Chief complaint acute on chronic right knee pain Images 3 views right knee Reading: Right knee is in a significant mount of valgus compared to the left lateral compartment has a significant amount of narrowing and subchondral sclerosis and osteophyte formation we also see patellofemoral arthritis Impression: Osteoarthritis right knee with valgus of 12 degrees    Assessment and plan  Encounter Diagnoses  Name Primary?   Chronic pain of right knee Yes   Acute pain of right knee    Primary osteoarthritis of right knee      The patient definitely had an arthritis flare which is subsequently resolved but she is at risk for further orthopedic flares and may need injections of cortisone or hyaluronic acid.  For now she will continue her arthritis medication use topical medications as needed  I see 2 to 3-year survival of this knee before knee replacement is needed.

## 2024-03-08 ENCOUNTER — Other Ambulatory Visit: Payer: Self-pay | Admitting: Internal Medicine

## 2024-03-08 DIAGNOSIS — G8929 Other chronic pain: Secondary | ICD-10-CM

## 2024-03-09 ENCOUNTER — Other Ambulatory Visit (HOSPITAL_COMMUNITY): Payer: Self-pay

## 2024-03-09 ENCOUNTER — Other Ambulatory Visit (HOSPITAL_BASED_OUTPATIENT_CLINIC_OR_DEPARTMENT_OTHER): Payer: Self-pay

## 2024-03-09 MED ORDER — DICLOFENAC SODIUM 75 MG PO TBEC
75.0000 mg | DELAYED_RELEASE_TABLET | Freq: Two times a day (BID) | ORAL | 1 refills | Status: DC
  Filled 2024-03-09 (×2): qty 90, 45d supply, fill #0
  Filled 2024-04-18: qty 90, 45d supply, fill #1

## 2024-03-10 ENCOUNTER — Other Ambulatory Visit (HOSPITAL_COMMUNITY): Payer: Self-pay

## 2024-03-16 ENCOUNTER — Ambulatory Visit: Admitting: Adult Health

## 2024-03-16 ENCOUNTER — Encounter: Payer: Self-pay | Admitting: Adult Health

## 2024-03-16 ENCOUNTER — Other Ambulatory Visit (HOSPITAL_BASED_OUTPATIENT_CLINIC_OR_DEPARTMENT_OTHER): Payer: Self-pay

## 2024-03-16 VITALS — BP 108/70 | HR 86 | Ht 61.5 in | Wt 166.0 lb

## 2024-03-16 DIAGNOSIS — Z4689 Encounter for fitting and adjustment of other specified devices: Secondary | ICD-10-CM

## 2024-03-16 DIAGNOSIS — R8290 Unspecified abnormal findings in urine: Secondary | ICD-10-CM

## 2024-03-16 DIAGNOSIS — N8110 Cystocele, unspecified: Secondary | ICD-10-CM | POA: Diagnosis not present

## 2024-03-16 DIAGNOSIS — R319 Hematuria, unspecified: Secondary | ICD-10-CM | POA: Diagnosis not present

## 2024-03-16 DIAGNOSIS — N390 Urinary tract infection, site not specified: Secondary | ICD-10-CM | POA: Insufficient documentation

## 2024-03-16 DIAGNOSIS — R300 Dysuria: Secondary | ICD-10-CM | POA: Diagnosis not present

## 2024-03-16 DIAGNOSIS — Z960 Presence of urogenital implants: Secondary | ICD-10-CM

## 2024-03-16 LAB — POCT URINALYSIS DIPSTICK
Glucose, UA: NEGATIVE
Nitrite, UA: POSITIVE
Protein, UA: POSITIVE — AB

## 2024-03-16 MED ORDER — NITROFURANTOIN MONOHYD MACRO 100 MG PO CAPS
100.0000 mg | ORAL_CAPSULE | Freq: Two times a day (BID) | ORAL | 0 refills | Status: DC
Start: 2024-03-16 — End: 2024-04-02
  Filled 2024-03-16: qty 14, 7d supply, fill #0

## 2024-03-16 NOTE — Progress Notes (Signed)
  Subjective:     Patient ID: Janice Higgins, female   DOB: 10/11/1955, 68 y.o.   MRN: [id number]  HPI Janice Higgins is a 68 year old white female, married, PM in for pessary maintenance, it came about about 1.5 weeks ago, and seems to be when has UTI. Has had some burning and frequency, and odor in urine  PCP is Dr Tobie  Review of Systems For pessary maintenance   +burning and frequency, and odor in urine Reviewed past medical,surgical, social and family history. Reviewed medications and allergies.  Objective:   Physical Exam BP 108/70 (BP Location: Right Arm, Patient Position: Sitting, Cuff Size: Normal)   Pulse 86   Ht 5' 1.5 (1.562 m)   Wt 166 lb (75.3 kg)   BMI 30.86 kg/m  urine dipstick:+nitrates, 2+blood, +protein and 3+leuks Skin warm and dry.Pelvic: external genitalia is normal in appearance no lesions, vagina: pale, looks more moist, Gellhorn pessary easily reinserted. Examination chaperoned by Clarita Salt LPN   [facility] - 03/16/24 1646       Pregnancy Intention Screening   Does the patient want to become pregnant in the next year? N/A    Does the patient's partner want to become pregnant in the next year? N/A    Would the patient like to discuss contraceptive options today? N/A      Contraception Wrap Up   Current Method --   PM   End Method --   PM   Contraception Counseling Provided No    How was the end contraceptive method provided? N/A             Assessment:     1. Burning with urination +burning with urination  - POCT Urinalysis Dipstick  2. Abnormal urine odor +odor - POCT Urinalysis Dipstick  3. Pessary maintenance (Primary) She brought pessary with her, cleaned and reinserted  4. Pelvic organ prolapse quantification stage 3 cystocele  5. Urinary tract infection with hematuria, site unspecified Will rx Macrobid   Meds ordered this encounter  Medications   nitrofurantoin , macrocrystal-monohydrate, (MACROBID ) 100 MG capsule    Sig: Take 1 capsule  (100 mg total) by mouth 2 (two) times daily.    Dispense:  14 capsule    Refill:  0    Supervising Provider:   JAYNE VONN DEL [[id number]]   Not enough urine for culture Gave cup she can ret culture after taking meds to make sure it has gone away     Plan:     Return in 3 months for pessary maintenance

## 2024-03-29 ENCOUNTER — Other Ambulatory Visit

## 2024-03-29 DIAGNOSIS — R8290 Unspecified abnormal findings in urine: Secondary | ICD-10-CM

## 2024-03-29 DIAGNOSIS — R300 Dysuria: Secondary | ICD-10-CM | POA: Diagnosis not present

## 2024-03-31 LAB — URINALYSIS, ROUTINE W REFLEX MICROSCOPIC
Bilirubin, UA: NEGATIVE
Glucose, UA: NEGATIVE
Nitrite, UA: NEGATIVE
Specific Gravity, UA: 1.022 (ref 1.005–1.030)
Urobilinogen, Ur: 0.2 mg/dL (ref 0.2–1.0)
pH, UA: 6 (ref 5.0–7.5)

## 2024-03-31 LAB — MICROSCOPIC EXAMINATION
Casts: NONE SEEN /LPF
WBC, UA: >30 /HPF — AB (ref 0–5)

## 2024-04-01 ENCOUNTER — Ambulatory Visit: Payer: Self-pay | Admitting: Adult Health

## 2024-04-02 ENCOUNTER — Other Ambulatory Visit: Payer: Self-pay | Admitting: Adult Health

## 2024-04-02 ENCOUNTER — Other Ambulatory Visit (HOSPITAL_BASED_OUTPATIENT_CLINIC_OR_DEPARTMENT_OTHER): Payer: Self-pay

## 2024-04-02 LAB — URINE CULTURE

## 2024-04-05 ENCOUNTER — Other Ambulatory Visit (HOSPITAL_BASED_OUTPATIENT_CLINIC_OR_DEPARTMENT_OTHER): Payer: Self-pay

## 2024-04-05 MED ORDER — NITROFURANTOIN MONOHYD MACRO 100 MG PO CAPS
100.0000 mg | ORAL_CAPSULE | Freq: Two times a day (BID) | ORAL | 0 refills | Status: DC
  Filled 2024-04-05: qty 14, 7d supply, fill #0

## 2024-04-06 ENCOUNTER — Other Ambulatory Visit (HOSPITAL_BASED_OUTPATIENT_CLINIC_OR_DEPARTMENT_OTHER): Payer: Self-pay

## 2024-04-06 ENCOUNTER — Other Ambulatory Visit: Payer: Self-pay

## 2024-04-15 ENCOUNTER — Encounter: Attending: Internal Medicine | Admitting: *Deleted

## 2024-04-15 ENCOUNTER — Encounter: Payer: Self-pay | Admitting: Internal Medicine

## 2024-04-15 VITALS — BP 133/81 | HR 102 | Temp 97.8°F | Resp 16

## 2024-04-15 DIAGNOSIS — M8580 Other specified disorders of bone density and structure, unspecified site: Secondary | ICD-10-CM | POA: Insufficient documentation

## 2024-04-15 MED ORDER — ACETAMINOPHEN 325 MG PO TABS: 650.0000 mg | ORAL_TABLET | Freq: Once | ORAL | Status: DC

## 2024-04-15 MED ORDER — DIPHENHYDRAMINE HCL 25 MG PO CAPS: 25.0000 mg | ORAL_CAPSULE | Freq: Once | ORAL | Status: DC

## 2024-04-15 MED ORDER — IBANDRONATE SODIUM 3 MG/3ML IV SOLN
3.0000 mg | Freq: Once | INTRAVENOUS | Status: AC
  Administered 2024-04-15: 3 mg via INTRAVENOUS

## 2024-04-15 NOTE — Progress Notes (Signed)
 Diagnosis: Osteoporosis  Provider:  Tobie Downs MD  Procedure: IV Push  IV Type: Peripheral, IV Location: R Hand  Boniva  (Ibandronate  Sodium), Dose: 3 mg  Post Infusion IV Care: Observation period completed  Discharge: Condition: Good, Destination: Home . AVS Provided  Performed by:  Baldwin Darice Helling, RN

## 2024-05-07 ENCOUNTER — Other Ambulatory Visit (HOSPITAL_BASED_OUTPATIENT_CLINIC_OR_DEPARTMENT_OTHER): Payer: Self-pay

## 2024-05-18 ENCOUNTER — Other Ambulatory Visit (HOSPITAL_BASED_OUTPATIENT_CLINIC_OR_DEPARTMENT_OTHER): Payer: Self-pay

## 2024-05-18 ENCOUNTER — Encounter: Payer: Self-pay | Admitting: Internal Medicine

## 2024-05-18 ENCOUNTER — Ambulatory Visit: Admitting: Internal Medicine

## 2024-05-18 VITALS — BP 118/71 | HR 102 | Ht 61.5 in | Wt 160.6 lb

## 2024-05-18 DIAGNOSIS — E669 Obesity, unspecified: Secondary | ICD-10-CM | POA: Diagnosis not present

## 2024-05-18 DIAGNOSIS — N3281 Overactive bladder: Secondary | ICD-10-CM

## 2024-05-18 DIAGNOSIS — R7303 Prediabetes: Secondary | ICD-10-CM

## 2024-05-18 DIAGNOSIS — M8580 Other specified disorders of bone density and structure, unspecified site: Secondary | ICD-10-CM | POA: Diagnosis not present

## 2024-05-18 DIAGNOSIS — I10 Essential (primary) hypertension: Secondary | ICD-10-CM | POA: Diagnosis not present

## 2024-05-18 DIAGNOSIS — N814 Uterovaginal prolapse, unspecified: Secondary | ICD-10-CM | POA: Diagnosis not present

## 2024-05-18 MED ORDER — PHENTERMINE HCL 37.5 MG PO TABS: 37.5000 mg | ORAL_TABLET | Freq: Every day | ORAL | 3 refills | Status: AC

## 2024-05-18 NOTE — Assessment & Plan Note (Addendum)
 With h/o compression fracture of thoracic vertebra Did not tolerate oral bisphosphonate On Boniva  currently - since 11/20, plan to hold it in the next visit

## 2024-05-18 NOTE — Assessment & Plan Note (Signed)
 Followed by OB/GYN Has been advised for surgical repair, she is thinking about it

## 2024-05-18 NOTE — Assessment & Plan Note (Signed)
 Uncontrolled On Detrol 2 mg once daily Also has uterine prolapse, followed by OB/GYN - urinary urgency had also improved with pessary placement, but now removed for pessary-free interval

## 2024-05-18 NOTE — Patient Instructions (Signed)
Please continue to take medications as prescribed.  Please continue to follow low salt diet and perform moderate exercise/walking at least 150 mins/week. 

## 2024-05-18 NOTE — Assessment & Plan Note (Signed)
 BP Readings from Last 1 Encounters:  05/18/24 118/71   Well-controlled with lisinopril  and amlodipine  Counseled for compliance with the medications Advised DASH diet and moderate exercise/walking, at least 150 mins/week

## 2024-05-18 NOTE — Assessment & Plan Note (Addendum)
 BMI Readings from Last 3 Encounters:  05/18/24 29.85 kg/m  03/16/24 30.86 kg/m  03/01/24 30.30 kg/m   Has lost about 12 lbs with Phentermine  Continue phentermine  37.5 mg QD for now, PDMP reviewed, refills sent Had lost > 40 lbs with Wegovy , had to stop due to insurance coverage concern Advised to follow low-carb diet and perform more moderate exercise/walking at least 150 minutes/week

## 2024-05-18 NOTE — Progress Notes (Signed)
 Established Patient Office Visit  Subjective:  Patient ID: Janice Higgins, female    DOB: 1956-06-27  Age: 68 y.o. MRN: [id number]  CC:  Chief Complaint  Patient presents with   Hypertension    3 month f/u    Obesity    F/u for weight management    HPI Janice Higgins is a 68 y.o. female with past medical history of HTN, GERD, osteopenia, breast ca. s/p lumpectomy, OA and obesity who presents for f/u of her chronic medical conditions.  HTN: BP is well-controlled. Takes medications regularly. Patient denies headache, dizziness, chest pain, dyspnea or palpitations.  Her DEXA scan showed osteopenia.  She has tried oral bisphosphonate, but had severe esophagitis with it.  She has been tolerating Boniva  well.  She takes Pepcid  for GERD now. Denies any nausea, vomiting, melena or hematochezia.  Obesity: She is taking phentermine  currently, tolerating it well.  She has lost about 12 lbs since the last visit.  She had lost more than 40 pounds with Wegovy , but had gained about 30 pounds since stopping the Wegovy  in 04/24.  She is trying to follow low-carb diet currently and is motivated to increase physical activity.  She has had improvement in urinary urgency with pessary placement, but has had removal now.  She has had recurrence of urinary incontinence now.  She takes Detrol  for it, but is not effective.  She is advised for surgical repair of uterine prolapse by her OB/GYN.  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   Family history of breast cancer    History of breast cancer 04/25/2015   History of bronchitis    History of kidney stones    Hypertension    Osteoarthritis    hands and feet   Osteoporosis    PMB (postmenopausal bleeding) 04/25/2015   Radiation 10/13/14-11/04/14   Left breast    Past Surgical History:  Procedure Laterality Date   BREAST LUMPECTOMY WITH RADIOACTIVE SEED LOCALIZATION  Left 08/12/2014   Procedure:  RADIOACTIVE SEED LOCALIZATION LEFT BREAST LUMPECTOMY;  Surgeon: Morene Olives, MD;  Location: [facility];  Service: General;  Laterality: Left;   CATARACT EXTRACTION W/PHACO Left 01/30/2024   Procedure: PHACOEMULSIFICATION, CATARACT, WITH IOL INSERTION;  Surgeon: Harrie Agent, MD;  Location: [facility];  Service: Ophthalmology;  Laterality: Left;  CDE 11.45   CATARACT EXTRACTION W/PHACO Right 02/13/2024   Procedure: PHACOEMULSIFICATION, CATARACT, WITH IOL INSERTION;  Surgeon: Harrie Agent, MD;  Location: [facility];  Service: Ophthalmology;  Laterality: Right;  CDE: 10.15   COLONOSCOPY N/A 05/04/2015   Procedure: COLONOSCOPY;  Surgeon: Claudis RAYMOND Rivet, MD;  Location: [facility];  Service: Endoscopy;  Laterality: N/A;  7:30   FOOT SURGERY Right 1995   exc. bone spur   LASIK     RE-EXCISION OF BREAST LUMPECTOMY Left 08/22/2014   Procedure: RE-EXCISION OF LEFT BREAST LUMPECTOMY;  Surgeon: Morene Olives, MD;  Location: [facility];  Service: General;  Laterality: Left;   RE-EXCISION OF BREAST LUMPECTOMY Left 09/05/2014   Procedure: RE-EXCISION OF BREAST LUMPECTOMY;  Surgeon: Morene Olives, MD;  Location: [facility];  Service: General;  Laterality: Left;   TONSILLECTOMY     TUBAL LIGATION      Family History  Problem Relation Age of Onset   Hypertension Father    Hypertension Mother    Heart disease Mother 20  Breast cancer Sister 34   Other Brother        lived 3 months   Heart attack Maternal Grandfather    Emphysema Paternal Grandfather    COPD Paternal Grandfather     Social History   Socioeconomic History   Marital status: Married    Spouse name: Not on file   Number of children: Not on file   Years of education: Not on file   Highest education level: GED or equivalent  Occupational History   Not on file  Tobacco Use   Smoking status: Never   Smokeless tobacco: Never  Vaping Use   Vaping status: Never Used   Substance and Sexual Activity   Alcohol use: No   Drug use: No   Sexual activity: Yes    Birth control/protection: Post-menopausal, Surgical    Comment: tubal  Other Topics Concern   Not on file  Social History Narrative   Not on file   Social Drivers of Health   Financial Resource Strain: Low Risk  (05/17/2024)   Overall Financial Resource Strain (CARDIA)    Difficulty of Paying Living Expenses: Not very hard  Food Insecurity: No Food Insecurity (05/17/2024)   Hunger Vital Sign    Worried About Running Out of Food in the Last Year: Never true    Ran Out of Food in the Last Year: Never true  Transportation Needs: No Transportation Needs (05/17/2024)   PRAPARE - Administrator, Civil Service (Medical): No    Lack of Transportation (Non-Medical): No  Physical Activity: Insufficiently Active (05/17/2024)   Exercise Vital Sign    Days of Exercise per Week: 2 days    Minutes of Exercise per Session: 10 min  Stress: No Stress Concern Present (05/17/2024)   [employer] of Occupational Health - Occupational Stress Questionnaire    Feeling of Stress: Not at all  Social Connections: Socially Integrated (05/17/2024)   Social Connection and Isolation Panel    Frequency of Communication with Friends and Family: Three times a week    Frequency of Social Gatherings with Friends and Family: Once a week    Attends Religious Services: More than 4 times per year    Active Member of [employer] or Organizations: Yes    Attends Engineer, Structural: More than 4 times per year    Marital Status: Married  Catering Manager Violence: Not At Risk (10/16/2022)   Humiliation, Afraid, Rape, and Kick questionnaire    Fear of Current or Ex-Partner: No    Emotionally Abused: No    Physically Abused: No    Sexually Abused: No    Outpatient Medications Prior to Visit  Medication Sig Dispense Refill   albuterol  (VENTOLIN  HFA) 108 (90 Base) MCG/ACT inhaler Inhale 2 puffs into the lungs  every 4 (four) hours as needed for wheezing or shortness of breath. 1 each 3   amLODipine  (NORVASC ) 5 MG tablet Take 1 tablet (5 mg total) by mouth daily. 90 tablet 3   cycloSPORINE  (RESTASIS ) 0.05 % ophthalmic emulsion Place 1 drop into both eyes 2 (two) times daily. 60 each 6   diclofenac  (VOLTAREN ) 75 MG EC tablet Take 1 tablet (75 mg total) by mouth 2 (two) times daily. 90 tablet 1   estradiol  (ESTRACE  VAGINAL) 0.1 MG/GM vaginal cream Pea-sized amount once per week 42.5 g 2   famotidine  (PEPCID ) 40 MG tablet Take 1 tablet (40 mg total) by mouth daily. 30 tablet 5   HYDROcodone -acetaminophen  (NORCO/VICODIN) 5-325 MG tablet  Take 1 tablet by mouth at bedtime as needed for pain 45 tablet 0   ibandronate  (BONIVA ) 3 MG/3ML SOLN injection Inject 3 mLs (3 mg total) into the vein every 3 (three) months. 3 mL 3   lisinopril  (ZESTRIL ) 5 MG tablet Take 1 tablet (5 mg total) by mouth daily. 90 tablet 3   tolterodine  (DETROL  LA) 2 MG 24 hr capsule Take 1 capsule (2 mg total) by mouth daily. 90 capsule 4   nitrofurantoin , macrocrystal-monohydrate, (MACROBID ) 100 MG capsule Take 1 capsule (100 mg total) by mouth 2 (two) times daily. 14 capsule 0   phentermine  (ADIPEX-P ) 37.5 MG tablet Take 1 tablet (37.5 mg total) by mouth daily before breakfast. 30 tablet 2   No facility-administered medications prior to visit.    Allergies  Allergen Reactions   Fosamax  [Alendronate  Sodium] Other (See Comments)    Severe GI Upset   Sulfa Antibiotics Hives    ROS Review of Systems  Constitutional:  Negative for chills and fever.  HENT:  Negative for congestion, sinus pressure, sinus pain and sore throat.   Eyes:  Negative for pain and discharge.  Respiratory:  Negative for cough and shortness of breath.   Cardiovascular:  Negative for chest pain and palpitations.  Gastrointestinal:  Negative for abdominal pain, diarrhea, nausea and vomiting.  Endocrine: Negative for polydipsia and polyuria.  Genitourinary:   Positive for urgency. Negative for dysuria and hematuria.  Musculoskeletal:  Positive for arthralgias and back pain. Negative for neck pain and neck stiffness.  Skin:  Negative for rash.  Neurological:  Negative for dizziness and weakness.  Psychiatric/Behavioral:  Negative for agitation and behavioral problems.       Objective:    Physical Exam Vitals reviewed.  Constitutional:      General: She is not in acute distress.    Appearance: She is obese. She is not diaphoretic.  HENT:     Head: Normocephalic and atraumatic.     Nose: Nose normal.     Mouth/Throat:     Mouth: Mucous membranes are moist.  Eyes:     General: No scleral icterus.    Extraocular Movements: Extraocular movements intact.  Cardiovascular:     Rate and Rhythm: Normal rate and regular rhythm.     Heart sounds: Normal heart sounds. No murmur heard. Pulmonary:     Breath sounds: Normal breath sounds. No wheezing or rales.  Abdominal:     Palpations: Abdomen is soft.     Tenderness: There is no abdominal tenderness.  Musculoskeletal:     Cervical back: Neck supple. No tenderness.     Right knee: Swelling present. Tenderness present.     Right lower leg: No edema.     Left lower leg: No edema.  Skin:    General: Skin is warm.     Findings: No rash.  Neurological:     General: No focal deficit present.     Mental Status: She is alert and oriented to person, place, and time.     Sensory: No sensory deficit.     Motor: No weakness.  Psychiatric:        Mood and Affect: Mood normal.        Behavior: Behavior normal.     BP 118/71   Pulse (!) 102   Ht 5' 1.5 (1.562 m)   Wt 160 lb 9.6 oz (72.8 kg)   SpO2 95%   BMI 29.85 kg/m  Wt Readings from Last 3 Encounters:  05/18/24 160 lb  9.6 oz (72.8 kg)  03/16/24 166 lb (75.3 kg)  03/01/24 163 lb (73.9 kg)    Lab Results  Component Value Date   TSH 2.780 02/18/2024   Lab Results  Component Value Date   WBC 6.1 02/18/2024   HGB 14.2 02/18/2024    HCT 42.6 02/18/2024   MCV 93 02/18/2024   PLT 317 02/18/2024   Lab Results  Component Value Date   NA 142 02/18/2024   K 4.1 02/18/2024   CO2 22 02/18/2024   GLUCOSE 97 02/18/2024   BUN 17 02/18/2024   CREATININE 0.74 02/18/2024   BILITOT 0.5 02/18/2024   ALKPHOS 44 02/18/2024   AST 21 02/18/2024   ALT 20 02/18/2024   PROT 7.2 02/18/2024   ALBUMIN 4.6 02/18/2024   CALCIUM 10.5 (H) 02/18/2024   ANIONGAP 5 04/03/2021   EGFR 89 02/18/2024   Lab Results  Component Value Date   CHOL 188 02/18/2024   Lab Results  Component Value Date   HDL 66 02/18/2024   Lab Results  Component Value Date   LDLCALC 105 (H) 02/18/2024   Lab Results  Component Value Date   TRIG 92 02/18/2024   Lab Results  Component Value Date   CHOLHDL 2.8 02/18/2024   Lab Results  Component Value Date   HGBA1C 5.6 02/18/2024      Assessment & Plan:   Problem List Items Addressed This Visit    Problem List Items Addressed This Visit       Cardiovascular and Mediastinum   Essential hypertension - Primary   BP Readings from Last 1 Encounters:  05/18/24 118/71   Well-controlled with lisinopril  and amlodipine  Counseled for compliance with the medications Advised DASH diet and moderate exercise/walking, at least 150 mins/week        Musculoskeletal and Integument   Osteopenia   With h/o compression fracture of thoracic vertebra Did not tolerate oral bisphosphonate On Boniva  currently - since 11/20, plan to hold it in the next visit        Genitourinary   Overactive bladder   Uncontrolled On Detrol  2 mg once daily Also has uterine prolapse, followed by OB/GYN - urinary urgency had also improved with pessary placement, but now removed for pessary-free interval      Uterine prolapse   Followed by OB/GYN Has been advised for surgical repair, she is thinking about it        Other   Obesity   BMI Readings from Last 3 Encounters:  05/18/24 29.85 kg/m  03/16/24 30.86 kg/m   03/01/24 30.30 kg/m   Has lost about 12 lbs with Phentermine  Continue phentermine  37.5 mg QD for now, PDMP reviewed, refills sent Had lost > 40 lbs with Wegovy , had to stop due to insurance coverage concern Advised to follow low-carb diet and perform more moderate exercise/walking at least 150 minutes/week      Relevant Medications   phentermine  (ADIPEX-P ) 37.5 MG tablet   Prediabetes   Lab Results  Component Value Date   HGBA1C 5.6 02/18/2024   Advised to follow low-carb diet for now         Meds ordered this encounter  Medications   phentermine  (ADIPEX-P ) 37.5 MG tablet    Sig: Take 1 tablet (37.5 mg total) by mouth daily before breakfast.    Dispense:  30 tablet    Refill:  3    Follow-up: Return in about 4 months (around 09/15/2024) for HTN and weight management.    Suzzane MARLA Blanch, MD

## 2024-05-18 NOTE — Assessment & Plan Note (Signed)
 Lab Results  Component Value Date   HGBA1C 5.6 02/18/2024   Advised to follow low-carb diet for now

## 2024-05-19 ENCOUNTER — Other Ambulatory Visit (HOSPITAL_BASED_OUTPATIENT_CLINIC_OR_DEPARTMENT_OTHER): Payer: Self-pay

## 2024-05-20 ENCOUNTER — Other Ambulatory Visit (HOSPITAL_BASED_OUTPATIENT_CLINIC_OR_DEPARTMENT_OTHER): Payer: Self-pay

## 2024-05-31 ENCOUNTER — Other Ambulatory Visit (HOSPITAL_BASED_OUTPATIENT_CLINIC_OR_DEPARTMENT_OTHER): Payer: Self-pay

## 2024-06-08 ENCOUNTER — Other Ambulatory Visit (HOSPITAL_BASED_OUTPATIENT_CLINIC_OR_DEPARTMENT_OTHER): Payer: Self-pay

## 2024-06-16 ENCOUNTER — Ambulatory Visit: Admitting: Adult Health

## 2024-06-16 ENCOUNTER — Encounter: Payer: Self-pay | Admitting: Adult Health

## 2024-06-16 VITALS — BP 118/71 | HR 102 | Ht 61.5 in | Wt 161.5 lb

## 2024-06-16 DIAGNOSIS — Z4689 Encounter for fitting and adjustment of other specified devices: Secondary | ICD-10-CM

## 2024-06-16 DIAGNOSIS — N952 Postmenopausal atrophic vaginitis: Secondary | ICD-10-CM | POA: Insufficient documentation

## 2024-06-16 DIAGNOSIS — N8110 Cystocele, unspecified: Secondary | ICD-10-CM

## 2024-06-16 NOTE — Progress Notes (Signed)
  Subjective:     Patient ID: Janice Higgins, female   DOB: [birth year], 68 y.o.   MRN: [id number]  HPI Janice Higgins is a 68 year old white female,married, PM, in to have gelhorn pessary reinserted, it feel out. And she needs refill on estrace  vaginal cream.  PCP is Dr Tobie  Review of Systems For pessary maintenance    Reviewed past medical,surgical, social and family history. Reviewed medications and allergies.  Objective:   Physical Exam BP 118/71 (BP Location: Right Arm, Patient Position: Sitting, Cuff Size: Normal)   Pulse (!) 102   Ht 5' 1.5 (1.562 m)   Wt 161 lb 8 oz (73.3 kg)   BMI 30.02 kg/m     Skin warm and dry.Pelvic: external genitalia is normal in appearance no lesions, vagina:pale and atrophic, +prolapse with cystocele, pessary washed and dried and reinserted. Examination chaperoned by Clarita Salt LPN   Upstream - 06/16/24 1623       Pregnancy Intention Screening   Does the patient want to become pregnant in the next year? N/A    Does the patient's partner want to become pregnant in the next year? N/A    Would the patient like to discuss contraceptive options today? N/A      Contraception Wrap Up   Current Method Female Sterilization;Post-Menopause    End Method Female Sterilization;Post-Menopause    Contraception Counseling Provided No          Assessment:     1. Pessary maintenance (Primary) Reinserted Gelhorn pessary   2. Pelvic organ prolapse quantification stage 3 cystocele   3. Vaginal atrophy Called refill in on estrace  vaginal cream, 42.5 gm use pea sized amount once weekly in vagina, with 1 refill to [facility]    Plan:     Follow up in 3 months for pessary maintenance

## 2024-06-17 ENCOUNTER — Other Ambulatory Visit (HOSPITAL_BASED_OUTPATIENT_CLINIC_OR_DEPARTMENT_OTHER): Payer: Self-pay

## 2024-06-17 MED ORDER — ESTRADIOL 0.01 % VA CREA
TOPICAL_CREAM | VAGINAL | 1 refills | Status: AC
  Filled 2024-06-17: qty 42.5, 90d supply, fill #0

## 2024-06-30 ENCOUNTER — Telehealth: Payer: Self-pay

## 2024-06-30 NOTE — Telephone Encounter (Signed)
 Copied from CRM #[id number]. Topic: Clinical - Prescription Issue >> Jun 30, 2024  9:56 AM Rosaria BRAVO wrote: Reason for CRM: Coleen BIRCH. from [facility] called to report that Boniva  on back order until May, clinic wants to know if PCP wants to change therapy?  [phone]

## 2024-06-30 NOTE — Telephone Encounter (Signed)
 Left detailed vm for Kianna, letting her know okay to hold off boniva  per Tobie.

## 2024-07-09 ENCOUNTER — Encounter: Payer: Self-pay | Admitting: Internal Medicine

## 2024-07-12 ENCOUNTER — Other Ambulatory Visit (HOSPITAL_BASED_OUTPATIENT_CLINIC_OR_DEPARTMENT_OTHER): Payer: Self-pay

## 2024-07-14 ENCOUNTER — Telehealth: Payer: Self-pay

## 2024-07-14 NOTE — Telephone Encounter (Signed)
 Copied from CRM [id number]. Topic: Appointments - Scheduling Inquiry for Clinic >> Jul 14, 2024 11:43 AM Harlene ORN wrote: Reason for CRM: needs to temporarily cancel because the beniva is on back order until further notice. Would like to switch to a temporary alternative if there is one available.. Please call back the infusion clinic to discuss rescheduling. (Phone: [phone]/ Fax: [phone])

## 2024-07-16 ENCOUNTER — Telehealth: Payer: Self-pay | Admitting: Emergency Medicine

## 2024-07-16 ENCOUNTER — Ambulatory Visit

## 2024-07-16 NOTE — Telephone Encounter (Signed)
 Dr Anthony nurse called to confirm that no new osteoporosis medication needed at this time.  Will resume Bonvia once it is off back order.

## 2024-07-16 NOTE — Telephone Encounter (Signed)
 Spoke to kelly services at the infusion center , verbalized understanding.

## 2024-07-19 ENCOUNTER — Encounter: Payer: Self-pay | Admitting: Internal Medicine

## 2024-07-26 ENCOUNTER — Encounter: Payer: Self-pay | Admitting: *Deleted

## 2024-08-04 ENCOUNTER — Other Ambulatory Visit: Payer: Self-pay | Admitting: Internal Medicine

## 2024-08-04 ENCOUNTER — Other Ambulatory Visit (HOSPITAL_BASED_OUTPATIENT_CLINIC_OR_DEPARTMENT_OTHER): Payer: Self-pay

## 2024-08-04 ENCOUNTER — Other Ambulatory Visit (HOSPITAL_COMMUNITY): Payer: Self-pay

## 2024-08-04 ENCOUNTER — Encounter: Payer: Self-pay | Admitting: Internal Medicine

## 2024-08-04 DIAGNOSIS — Z0000 Encounter for general adult medical examination without abnormal findings: Secondary | ICD-10-CM

## 2024-08-04 DIAGNOSIS — G8929 Other chronic pain: Secondary | ICD-10-CM

## 2024-08-05 ENCOUNTER — Other Ambulatory Visit (HOSPITAL_BASED_OUTPATIENT_CLINIC_OR_DEPARTMENT_OTHER): Payer: Self-pay

## 2024-08-05 ENCOUNTER — Other Ambulatory Visit: Payer: Self-pay

## 2024-08-05 MED ORDER — FAMOTIDINE 40 MG PO TABS
40.0000 mg | ORAL_TABLET | Freq: Every day | ORAL | 5 refills | Status: AC
  Filled 2024-08-05: qty 90, 90d supply, fill #0
  Filled 2024-08-12: qty 30, 30d supply, fill #0

## 2024-08-08 ENCOUNTER — Other Ambulatory Visit (HOSPITAL_COMMUNITY): Payer: Self-pay

## 2024-08-09 ENCOUNTER — Other Ambulatory Visit: Payer: Self-pay

## 2024-08-11 ENCOUNTER — Other Ambulatory Visit: Payer: Self-pay | Admitting: Internal Medicine

## 2024-08-11 ENCOUNTER — Other Ambulatory Visit (HOSPITAL_BASED_OUTPATIENT_CLINIC_OR_DEPARTMENT_OTHER): Payer: Self-pay

## 2024-08-11 ENCOUNTER — Other Ambulatory Visit (HOSPITAL_COMMUNITY): Payer: Self-pay

## 2024-08-11 DIAGNOSIS — M545 Low back pain, unspecified: Secondary | ICD-10-CM

## 2024-08-11 MED ORDER — DICLOFENAC SODIUM 75 MG PO TBEC
75.0000 mg | DELAYED_RELEASE_TABLET | Freq: Two times a day (BID) | ORAL | 1 refills | Status: AC
  Filled 2024-08-11: qty 60, 30d supply, fill #0
  Filled 2024-08-11: qty 30, 15d supply, fill #0

## 2024-08-12 ENCOUNTER — Other Ambulatory Visit (HOSPITAL_COMMUNITY): Payer: Self-pay

## 2024-08-12 ENCOUNTER — Other Ambulatory Visit (HOSPITAL_BASED_OUTPATIENT_CLINIC_OR_DEPARTMENT_OTHER): Payer: Self-pay

## 2024-09-14 ENCOUNTER — Ambulatory Visit: Admitting: Adult Health

## 2024-09-15 ENCOUNTER — Ambulatory Visit: Admitting: Internal Medicine
